# Patient Record
Sex: Male | Born: 1935 | Race: White | Hispanic: No | Smoking: Former smoker
Health system: Southern US, Community
[De-identification: ages and names within clinical notes are randomized; demographics above are authoritative.]

## PROBLEM LIST (undated history)

## (undated) DIAGNOSIS — I2510 Atherosclerotic heart disease of native coronary artery without angina pectoris: Secondary | ICD-10-CM

## (undated) DIAGNOSIS — I471 Supraventricular tachycardia, unspecified: Secondary | ICD-10-CM

## (undated) DIAGNOSIS — G629 Polyneuropathy, unspecified: Secondary | ICD-10-CM

## (undated) DIAGNOSIS — I509 Heart failure, unspecified: Secondary | ICD-10-CM

## (undated) DIAGNOSIS — I639 Cerebral infarction, unspecified: Secondary | ICD-10-CM

## (undated) DIAGNOSIS — Z951 Presence of aortocoronary bypass graft: Secondary | ICD-10-CM

## (undated) DIAGNOSIS — C61 Malignant neoplasm of prostate: Secondary | ICD-10-CM

## (undated) DIAGNOSIS — E7800 Pure hypercholesterolemia, unspecified: Secondary | ICD-10-CM

## (undated) HISTORY — PX: PROSTATECTOMY: SHX69

## (undated) HISTORY — PX: REPLACEMENT TOTAL KNEE BILATERAL: SUR1225

## (undated) HISTORY — PX: CORONARY ARTERY BYPASS GRAFT: SHX141

## (undated) HISTORY — PX: AORTIC VALVE REPLACEMENT: SHX41

## (undated) HISTORY — PX: FRACTURE SURGERY: SHX138

## (undated) HISTORY — PX: BACK SURGERY: SHX140

## (undated) HISTORY — DX: Cerebral infarction, unspecified: I63.9

---

## 2004-01-17 ENCOUNTER — Ambulatory Visit: Payer: Self-pay | Admitting: Urology

## 2004-01-23 ENCOUNTER — Ambulatory Visit: Payer: Self-pay | Admitting: Urology

## 2004-01-24 ENCOUNTER — Other Ambulatory Visit: Payer: Self-pay

## 2004-01-24 ENCOUNTER — Emergency Department: Payer: Self-pay | Admitting: Unknown Physician Specialty

## 2004-08-21 ENCOUNTER — Ambulatory Visit: Payer: Self-pay | Admitting: Internal Medicine

## 2004-09-13 ENCOUNTER — Other Ambulatory Visit: Payer: Self-pay

## 2004-09-23 ENCOUNTER — Inpatient Hospital Stay: Payer: Self-pay | Admitting: General Practice

## 2005-04-10 ENCOUNTER — Other Ambulatory Visit: Payer: Self-pay

## 2005-04-23 ENCOUNTER — Inpatient Hospital Stay: Payer: Self-pay | Admitting: General Practice

## 2006-07-16 ENCOUNTER — Ambulatory Visit: Payer: Self-pay | Admitting: Internal Medicine

## 2008-01-25 ENCOUNTER — Ambulatory Visit: Payer: Self-pay | Admitting: Urology

## 2008-10-17 ENCOUNTER — Inpatient Hospital Stay: Payer: Self-pay | Admitting: Internal Medicine

## 2008-11-08 ENCOUNTER — Encounter: Payer: Self-pay | Admitting: Internal Medicine

## 2008-11-29 ENCOUNTER — Encounter: Payer: Self-pay | Admitting: Internal Medicine

## 2008-12-14 ENCOUNTER — Encounter: Payer: Self-pay | Admitting: Internal Medicine

## 2008-12-29 ENCOUNTER — Encounter: Payer: Self-pay | Admitting: Internal Medicine

## 2009-01-29 ENCOUNTER — Encounter: Payer: Self-pay | Admitting: Internal Medicine

## 2012-11-08 ENCOUNTER — Ambulatory Visit: Payer: Self-pay | Admitting: Urology

## 2012-11-08 LAB — CBC WITH DIFFERENTIAL/PLATELET
Basophil #: 0 10*3/uL (ref 0.0–0.1)
Eosinophil #: 0.1 10*3/uL (ref 0.0–0.7)
Eosinophil %: 2.4 %
HGB: 11.8 g/dL — ABNORMAL LOW (ref 13.0–18.0)
Lymphocyte #: 0.7 10*3/uL — ABNORMAL LOW (ref 1.0–3.6)
MCH: 31.3 pg (ref 26.0–34.0)
MCHC: 34.5 g/dL (ref 32.0–36.0)
Monocyte #: 0.5 x10 3/mm (ref 0.2–1.0)
Neutrophil %: 74.2 %
Platelet: 193 10*3/uL (ref 150–440)
WBC: 5.4 10*3/uL (ref 3.8–10.6)

## 2012-11-08 LAB — BASIC METABOLIC PANEL
Anion Gap: 2 — ABNORMAL LOW (ref 7–16)
BUN: 16 mg/dL (ref 7–18)
Co2: 32 mmol/L (ref 21–32)
Glucose: 81 mg/dL (ref 65–99)

## 2012-11-16 ENCOUNTER — Ambulatory Visit: Payer: Self-pay | Admitting: Urology

## 2012-11-18 LAB — PATHOLOGY REPORT

## 2013-08-11 ENCOUNTER — Emergency Department: Payer: Self-pay | Admitting: Emergency Medicine

## 2013-08-11 LAB — BASIC METABOLIC PANEL
ANION GAP: 4 — AB (ref 7–16)
BUN: 20 mg/dL — AB (ref 7–18)
CALCIUM: 8.5 mg/dL (ref 8.5–10.1)
CO2: 30 mmol/L (ref 21–32)
Chloride: 105 mmol/L (ref 98–107)
Creatinine: 0.77 mg/dL (ref 0.60–1.30)
EGFR (Non-African Amer.): >60
GLUCOSE: 75 mg/dL (ref 65–99)
OSMOLALITY: 279 (ref 275–301)
Potassium: 4.9 mmol/L (ref 3.5–5.1)
SODIUM: 139 mmol/L (ref 136–145)

## 2013-08-11 LAB — CBC
HCT: 34 % — ABNORMAL LOW (ref 40.0–52.0)
HGB: 11.6 g/dL — ABNORMAL LOW (ref 13.0–18.0)
MCH: 31.6 pg (ref 26.0–34.0)
MCHC: 34.2 g/dL (ref 32.0–36.0)
MCV: 92 fL (ref 80–100)
Platelet: 161 10*3/uL (ref 150–440)
RBC: 3.68 10*6/uL — ABNORMAL LOW (ref 4.40–5.90)
RDW: 12.8 % (ref 11.5–14.5)
WBC: 5.2 10*3/uL (ref 3.8–10.6)

## 2013-08-11 LAB — TROPONIN I

## 2013-08-12 LAB — CK: CK, Total: 146 U/L

## 2014-07-21 NOTE — Op Note (Signed)
PATIENT NAME:  Juan Mayo, Juan Mayo MR#:  [id number] DATE OF BIRTH:  June 29, 1921  DATE OF PROCEDURE:  11/16/2012  PRINCIPLE DIAGNOSIS: Bladder neoplasm uncertain.   POSTOPERATIVE DIAGNOSIS: Bladder neoplasm uncertain.  PROCEDURE: Cystoscopy, bladder biopsy, bladder instillation.   SURGEON: Edrick Oh, MD  ANESTHESIA: Laryngeal mask airway anesthesia.   INDICATIONS: The patient is an 81 year old gentleman with a history of prostate cancer. He developed increased urinary frequency and urgency. Recent cystoscopy demonstrated a small papillary-appearing lesion on the left trigone. He presents for biopsy of the lesion.   DESCRIPTION OF PROCEDURE: After informed consent was obtained, the patient was taken to the operating room and placed in the dorsal lithotomy position under laryngeal mask airway anesthesia. The patient was then prepped and draped in the usual standard fashion. The 23 French rigid cystoscope was introduced into the urethra under direct vision with no urethral abnormalities noted. Upon entering the prostate, moderate bilobar tissue was present with only partial visual obstruction. Upon entering the bladder, the mucosa was inspected in its entirety with no gross mucosal lesions noted. Bilateral ureteral orifices were well visualized with no lesions noted. 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 Cold cup biopsy forceps were then utilized to remove the lesion. This was removed in 2 portions. The ureteral orifice was noted to retract somewhat lateral with the first biopsy.  A guidewire was inserted into the left ureteral orifice. There was an adequate rim of mucosal tissue at its more distal aspect. Cautery was utilized around the base of the biopsy site except for the edges of the ureteral orifice itself. No significant bleeding  was present in this region. The decision was made not to place a stent due to the rim of normal tissue at the orifice itself. No other lesions were noted anywhere else in the bladder. The bladder was drained. The cystoscope was removed. An 21 French red rubber catheter was inserted into the urinary bladder. 40 mL of 2% lidocaine was instilled into the bladder. The catheter was removed. The patient was returned to the supine position and awakened from laryngeal mask airway anesthesia. The patient tolerated the procedure well. There were no problems or complications.   ESTIMATED BLOOD LOSS: Minimal.    ____________________________ Denice Bors. Jacqlyn Larsen, MD bsc:dp D: 11/16/2012 11:59:32 ET T: 11/16/2012 13:40:48 ET JOB#: [id number]  cc: Denice Bors. Jacqlyn Larsen, MD, <Dictator> Denice Bors Angelgabriel Willmore MD ELECTRONICALLY SIGNED 11/16/2012 21:31

## 2015-03-18 ENCOUNTER — Inpatient Hospital Stay
Admission: EM | Admit: 2015-03-18 | Discharge: 2015-03-21 | DRG: 481 | Disposition: A | Discharge status: Skilled Nursing Facility | Payer: PPO | Attending: Internal Medicine | Admitting: Internal Medicine

## 2015-03-18 ENCOUNTER — Encounter
Admission: EM | Disposition: A | Discharge status: Skilled Nursing Facility | Payer: Self-pay | Source: Home / Self Care | Attending: Internal Medicine

## 2015-03-18 ENCOUNTER — Encounter: Payer: Self-pay | Admitting: Emergency Medicine

## 2015-03-18 ENCOUNTER — Inpatient Hospital Stay: Payer: PPO

## 2015-03-18 ENCOUNTER — Inpatient Hospital Stay: Payer: PPO | Admitting: Anesthesiology

## 2015-03-18 ENCOUNTER — Emergency Department: Payer: PPO

## 2015-03-18 DIAGNOSIS — I2510 Atherosclerotic heart disease of native coronary artery without angina pectoris: Secondary | ICD-10-CM | POA: Diagnosis present

## 2015-03-18 DIAGNOSIS — S72141A Displaced intertrochanteric fracture of right femur, initial encounter for closed fracture: Principal | ICD-10-CM | POA: Diagnosis present

## 2015-03-18 DIAGNOSIS — D62 Acute posthemorrhagic anemia: Secondary | ICD-10-CM | POA: Diagnosis not present

## 2015-03-18 DIAGNOSIS — Z7982 Long term (current) use of aspirin: Secondary | ICD-10-CM | POA: Diagnosis not present

## 2015-03-18 DIAGNOSIS — I10 Essential (primary) hypertension: Secondary | ICD-10-CM | POA: Diagnosis present

## 2015-03-18 DIAGNOSIS — Z951 Presence of aortocoronary bypass graft: Secondary | ICD-10-CM | POA: Diagnosis not present

## 2015-03-18 DIAGNOSIS — E7800 Pure hypercholesterolemia, unspecified: Secondary | ICD-10-CM | POA: Diagnosis present

## 2015-03-18 DIAGNOSIS — Z96653 Presence of artificial knee joint, bilateral: Secondary | ICD-10-CM | POA: Diagnosis present

## 2015-03-18 DIAGNOSIS — Z952 Presence of prosthetic heart valve: Secondary | ICD-10-CM

## 2015-03-18 DIAGNOSIS — G629 Polyneuropathy, unspecified: Secondary | ICD-10-CM | POA: Diagnosis present

## 2015-03-18 DIAGNOSIS — Z8546 Personal history of malignant neoplasm of prostate: Secondary | ICD-10-CM | POA: Diagnosis not present

## 2015-03-18 DIAGNOSIS — Z79899 Other long term (current) drug therapy: Secondary | ICD-10-CM | POA: Diagnosis not present

## 2015-03-18 DIAGNOSIS — Z87891 Personal history of nicotine dependence: Secondary | ICD-10-CM | POA: Diagnosis not present

## 2015-03-18 DIAGNOSIS — Z953 Presence of xenogenic heart valve: Secondary | ICD-10-CM | POA: Diagnosis not present

## 2015-03-18 DIAGNOSIS — W19XXXA Unspecified fall, initial encounter: Secondary | ICD-10-CM | POA: Diagnosis present

## 2015-03-18 DIAGNOSIS — S72009A Fracture of unspecified part of neck of unspecified femur, initial encounter for closed fracture: Secondary | ICD-10-CM | POA: Diagnosis present

## 2015-03-18 HISTORY — DX: Pure hypercholesterolemia, unspecified: E78.00

## 2015-03-18 HISTORY — DX: Malignant neoplasm of prostate: C61

## 2015-03-18 HISTORY — DX: Polyneuropathy, unspecified: G62.9

## 2015-03-18 HISTORY — PX: INTRAMEDULLARY (IM) NAIL INTERTROCHANTERIC: SHX5875

## 2015-03-18 LAB — CBC WITH DIFFERENTIAL/PLATELET
BASOS ABS: 0.1 10*3/uL (ref 0–0.1)
BASOS PCT: 1 %
EOS ABS: 0.2 10*3/uL (ref 0–0.7)
Eosinophils Relative: 2 %
HEMATOCRIT: 37.6 % — AB (ref 40.0–52.0)
Hemoglobin: 12.6 g/dL — ABNORMAL LOW (ref 13.0–18.0)
Lymphocytes Relative: 7 %
Lymphs Abs: 0.7 10*3/uL — ABNORMAL LOW (ref 1.0–3.6)
MCH: 31 pg (ref 26.0–34.0)
MCHC: 33.6 g/dL (ref 32.0–36.0)
MCV: 92.4 fL (ref 80.0–100.0)
MONO ABS: 0.6 10*3/uL (ref 0.2–1.0)
MONOS PCT: 5 %
NEUTROS ABS: 8.9 10*3/uL — AB (ref 1.4–6.5)
NEUTROS PCT: 85 %
PLATELETS: 200 10*3/uL (ref 150–440)
RBC: 4.06 MIL/uL — ABNORMAL LOW (ref 4.40–5.90)
RDW: 13 % (ref 11.5–14.5)
WBC: 10.4 10*3/uL (ref 3.8–10.6)

## 2015-03-18 LAB — COMPREHENSIVE METABOLIC PANEL
ALBUMIN: 4 g/dL (ref 3.5–5.0)
ALT: 15 U/L — ABNORMAL LOW (ref 17–63)
AST: 20 U/L (ref 15–41)
Alkaline Phosphatase: 91 U/L (ref 38–126)
Anion gap: 5 (ref 5–15)
BILIRUBIN TOTAL: 0.6 mg/dL (ref 0.3–1.2)
BUN: 19 mg/dL (ref 6–20)
CO2: 31 mmol/L (ref 22–32)
Calcium: 8.9 mg/dL (ref 8.9–10.3)
Chloride: 101 mmol/L (ref 101–111)
Creatinine, Ser: 0.91 mg/dL (ref 0.61–1.24)
GFR calc Af Amer: >60 mL/min (ref >60)
GFR calc non Af Amer: >60 mL/min (ref >60)
GLUCOSE: 123 mg/dL — AB (ref 65–99)
POTASSIUM: 4.4 mmol/L (ref 3.5–5.1)
Sodium: 137 mmol/L (ref 135–145)
TOTAL PROTEIN: 6.8 g/dL (ref 6.5–8.1)

## 2015-03-18 LAB — URINALYSIS COMPLETE WITH MICROSCOPIC (ARMC ONLY)
Bacteria, UA: NONE SEEN
Bilirubin Urine: NEGATIVE
Glucose, UA: NEGATIVE mg/dL
Leukocytes, UA: NEGATIVE
Nitrite: NEGATIVE
Protein, ur: NEGATIVE mg/dL
Specific Gravity, Urine: 1.018 (ref 1.005–1.030)
pH: 6 (ref 5.0–8.0)

## 2015-03-18 LAB — PROTIME-INR
INR: 1.09
PROTHROMBIN TIME: 14.3 s (ref 11.4–15.0)

## 2015-03-18 LAB — APTT: aPTT: 29 seconds (ref 24–36)

## 2015-03-18 LAB — SURGICAL PCR SCREEN
MRSA, PCR: NEGATIVE
Staphylococcus aureus: NEGATIVE

## 2015-03-18 LAB — TYPE AND SCREEN
ABO/RH(D): A POS
Antibody Screen: NEGATIVE

## 2015-03-18 LAB — TSH: TSH: 5.78 u[IU]/mL — ABNORMAL HIGH (ref 0.350–4.500)

## 2015-03-18 LAB — ABO/RH: ABO/RH(D): A POS

## 2015-03-18 SURGERY — FIXATION, FRACTURE, INTERTROCHANTERIC, WITH INTRAMEDULLARY ROD
Anesthesia: Spinal | Laterality: Right | Wound class: Clean

## 2015-03-18 MED ORDER — MORPHINE SULFATE (PF) 4 MG/ML IV SOLN
4.0000 mg | Freq: Once | INTRAVENOUS | Status: AC
  Administered 2015-03-18: 4 mg via INTRAVENOUS
  Filled 2015-03-18: qty 1

## 2015-03-18 MED ORDER — BISACODYL 10 MG RE SUPP
10.0000 mg | Freq: Every day | RECTAL | Status: DC | PRN
  Administered 2015-03-21: 10 mg via RECTAL
  Filled 2015-03-18: qty 1

## 2015-03-18 MED ORDER — MENTHOL 3 MG MT LOZG
1.0000 | LOZENGE | OROMUCOSAL | Status: DC | PRN
  Filled 2015-03-18: qty 9

## 2015-03-18 MED ORDER — CEFAZOLIN SODIUM 1-5 GM-% IV SOLN
1.0000 g | Freq: Once | INTRAVENOUS | Status: AC
  Administered 2015-03-18: 1 g via INTRAVENOUS
  Filled 2015-03-18: qty 50

## 2015-03-18 MED ORDER — DOCUSATE SODIUM 100 MG PO CAPS
100.0000 mg | ORAL_CAPSULE | Freq: Two times a day (BID) | ORAL | Status: DC
  Administered 2015-03-18 – 2015-03-21 (×4): 100 mg via ORAL
  Filled 2015-03-18 (×5): qty 1

## 2015-03-18 MED ORDER — BUPIVACAINE HCL (PF) 0.75 % IJ SOLN
INTRAMUSCULAR | Status: DC | PRN
Start: 2015-03-18 — End: 2015-03-18
  Administered 2015-03-18: 12 mg

## 2015-03-18 MED ORDER — ENOXAPARIN SODIUM 40 MG/0.4ML ~~LOC~~ SOLN
40.0000 mg | SUBCUTANEOUS | Status: DC
  Administered 2015-03-19 – 2015-03-21 (×3): 40 mg via SUBCUTANEOUS
  Filled 2015-03-18 (×3): qty 0.4

## 2015-03-18 MED ORDER — CEFAZOLIN (ANCEF) 1 G IV SOLR
1.0000 g | INTRAVENOUS | Status: DC
  Filled 2015-03-18: qty 1

## 2015-03-18 MED ORDER — ONDANSETRON HCL 4 MG PO TABS
4.0000 mg | ORAL_TABLET | Freq: Four times a day (QID) | ORAL | Status: DC | PRN
  Administered 2015-03-19: 4 mg via ORAL
  Filled 2015-03-18: qty 1

## 2015-03-18 MED ORDER — MAGNESIUM CITRATE PO SOLN
1.0000 | Freq: Once | ORAL | Status: DC | PRN
  Filled 2015-03-18: qty 296

## 2015-03-18 MED ORDER — ALUM & MAG HYDROXIDE-SIMETH 200-200-20 MG/5ML PO SUSP: 30.0000 mL | ORAL | Status: DC | PRN

## 2015-03-18 MED ORDER — HYDROCODONE-ACETAMINOPHEN 5-325 MG PO TABS
1.0000 | ORAL_TABLET | Freq: Four times a day (QID) | ORAL | Status: DC | PRN
  Administered 2015-03-18 – 2015-03-21 (×9): 1 via ORAL
  Filled 2015-03-18: qty 2
  Filled 2015-03-18 (×8): qty 1

## 2015-03-18 MED ORDER — PROPOFOL 10 MG/ML IV BOLUS
INTRAVENOUS | Status: DC | PRN
  Administered 2015-03-18: 10 mg via INTRAVENOUS
  Administered 2015-03-18 (×2): 20 mg via INTRAVENOUS
  Administered 2015-03-18: 15 mg via INTRAVENOUS

## 2015-03-18 MED ORDER — MORPHINE SULFATE (PF) 2 MG/ML IV SOLN
1.0000 mg | INTRAVENOUS | Status: DC | PRN
  Administered 2015-03-18: 1 mg via INTRAVENOUS
  Filled 2015-03-18: qty 1

## 2015-03-18 MED ORDER — LACTATED RINGERS IV SOLN
INTRAVENOUS | Status: DC | PRN
  Administered 2015-03-18: 16:00:00 via INTRAVENOUS

## 2015-03-18 MED ORDER — METOCLOPRAMIDE HCL 5 MG/ML IJ SOLN: 5.0000 mg | Freq: Three times a day (TID) | INTRAMUSCULAR | Status: DC | PRN

## 2015-03-18 MED ORDER — MAGNESIUM HYDROXIDE 400 MG/5ML PO SUSP
30.0000 mL | Freq: Every day | ORAL | Status: DC | PRN
  Administered 2015-03-19 – 2015-03-20 (×2): 30 mL via ORAL
  Filled 2015-03-18 (×2): qty 30

## 2015-03-18 MED ORDER — ONDANSETRON HCL 4 MG/2ML IJ SOLN
4.0000 mg | Freq: Once | INTRAMUSCULAR | Status: AC
  Administered 2015-03-18: 4 mg via INTRAVENOUS
  Filled 2015-03-18: qty 2

## 2015-03-18 MED ORDER — ONDANSETRON HCL 4 MG/2ML IJ SOLN: 4.0000 mg | Freq: Four times a day (QID) | INTRAMUSCULAR | Status: DC | PRN

## 2015-03-18 MED ORDER — PHENYLEPHRINE HCL 10 MG/ML IJ SOLN
INTRAMUSCULAR | Status: DC | PRN
  Administered 2015-03-18: 200 ug via INTRAVENOUS

## 2015-03-18 MED ORDER — SODIUM CHLORIDE 0.9 % IV SOLN
INTRAVENOUS | Status: DC
  Administered 2015-03-18: 18:00:00 via INTRAVENOUS

## 2015-03-18 MED ORDER — ACETAMINOPHEN 325 MG PO TABS: 650.0000 mg | ORAL_TABLET | Freq: Four times a day (QID) | ORAL | Status: DC | PRN

## 2015-03-18 MED ORDER — DEXTROSE-NACL 5-0.9 % IV SOLN: INTRAVENOUS | Status: DC

## 2015-03-18 MED ORDER — GABAPENTIN 300 MG PO CAPS
300.0000 mg | ORAL_CAPSULE | Freq: Every day | ORAL | Status: DC
  Administered 2015-03-19 – 2015-03-21 (×3): 300 mg via ORAL
  Filled 2015-03-18 (×3): qty 1

## 2015-03-18 MED ORDER — SODIUM CHLORIDE 0.9 % IV SOLN
Freq: Once | INTRAVENOUS | Status: AC
  Administered 2015-03-18: 04:00:00 via INTRAVENOUS

## 2015-03-18 MED ORDER — CEFAZOLIN SODIUM 1-5 GM-% IV SOLN
1.0000 g | Freq: Four times a day (QID) | INTRAVENOUS | Status: AC
  Administered 2015-03-18 – 2015-03-19 (×3): 1 g via INTRAVENOUS
  Filled 2015-03-18 (×4): qty 50

## 2015-03-18 MED ORDER — ACETAMINOPHEN 650 MG RE SUPP: 650.0000 mg | Freq: Four times a day (QID) | RECTAL | Status: DC | PRN

## 2015-03-18 MED ORDER — ONDANSETRON HCL 4 MG/2ML IJ SOLN: 4.0000 mg | Freq: Once | INTRAMUSCULAR | Status: DC | PRN

## 2015-03-18 MED ORDER — SODIUM CHLORIDE 0.9 % IJ SOLN
3.0000 mL | Freq: Two times a day (BID) | INTRAMUSCULAR | Status: DC
Start: 2015-03-18 — End: 2015-03-21
  Administered 2015-03-18 – 2015-03-21 (×6): 3 mL via INTRAVENOUS

## 2015-03-18 MED ORDER — METOPROLOL TARTRATE 25 MG PO TABS
25.0000 mg | ORAL_TABLET | Freq: Two times a day (BID) | ORAL | Status: DC
  Administered 2015-03-18 – 2015-03-21 (×6): 25 mg via ORAL
  Filled 2015-03-18 (×6): qty 1

## 2015-03-18 MED ORDER — PHENOL 1.4 % MT LIQD
1.0000 | OROMUCOSAL | Status: DC | PRN
  Filled 2015-03-18: qty 177

## 2015-03-18 MED ORDER — FENTANYL CITRATE (PF) 100 MCG/2ML IJ SOLN: 25.0000 ug | INTRAMUSCULAR | Status: DC | PRN

## 2015-03-18 MED ORDER — FENTANYL CITRATE (PF) 100 MCG/2ML IJ SOLN
INTRAMUSCULAR | Status: DC | PRN
  Administered 2015-03-18 (×2): 25 ug via INTRAVENOUS

## 2015-03-18 MED ORDER — METOCLOPRAMIDE HCL 5 MG PO TABS: 5.0000 mg | ORAL_TABLET | Freq: Three times a day (TID) | ORAL | Status: DC | PRN

## 2015-03-18 MED ORDER — NEOMYCIN-POLYMYXIN B GU 40-200000 IR SOLN
Status: DC | PRN
  Administered 2015-03-18: 500 mL

## 2015-03-18 SURGICAL SUPPLY — 37 items
BIT DRILL CANN LG 4.3MM (BIT) ×1 IMPLANT
CANISTER SUCT 1200ML W/VALVE (MISCELLANEOUS) ×3 IMPLANT
CHLORAPREP W/TINT 26ML (MISCELLANEOUS) ×3 IMPLANT
DRAPE SHEET LG 3/4 BI-LAMINATE (DRAPES) ×3 IMPLANT
DRAPE SURG 17X11 SM STRL (DRAPES) ×3 IMPLANT
DRAPE U-SHAPE 47X51 STRL (DRAPES) ×3 IMPLANT
DRILL BIT CANN LG 4.3MM (BIT) ×3
DRSG OPSITE POSTOP 3X4 (GAUZE/BANDAGES/DRESSINGS) ×9 IMPLANT
ELECT CAUTERY BLADE 6.4 (BLADE) ×3 IMPLANT
GAUZE SPONGE 4X4 12PLY STRL (GAUZE/BANDAGES/DRESSINGS) ×3 IMPLANT
GLOVE BIOGEL PI IND STRL 7.0 (GLOVE) ×1 IMPLANT
GLOVE BIOGEL PI IND STRL 9 (GLOVE) ×1 IMPLANT
GLOVE BIOGEL PI INDICATOR 7.0 (GLOVE) ×2
GLOVE BIOGEL PI INDICATOR 9 (GLOVE) ×2
GLOVE SURG ORTHO 9.0 STRL STRW (GLOVE) ×3 IMPLANT
GLOVE SURG SYN 6.5 ES PF (GLOVE) ×3 IMPLANT
GOWN SPECIALTY ULTRA XL (MISCELLANEOUS) ×3 IMPLANT
GOWN STRL REUS W/ TWL LRG LVL3 (GOWN DISPOSABLE) ×1 IMPLANT
GOWN STRL REUS W/TWL LRG LVL3 (GOWN DISPOSABLE) ×2
GUIDEPIN VERSANAIL DSP 3.2X444 ×3 IMPLANT
HIP FRAC NAIL LAG SCR 10.5X100 (Orthopedic Implant) ×2 IMPLANT
IV NS 500ML (IV SOLUTION) ×2
IV NS 500ML BAXH (IV SOLUTION) ×1 IMPLANT
KIT RM TURNOVER STRD PROC AR (KITS) ×3 IMPLANT
MAT BLUE FLOOR 46X72 FLO (MISCELLANEOUS) ×3 IMPLANT
NAIL HIP FRACT 130D 9X180 (Orthopedic Implant) ×3 IMPLANT
NEEDLE FILTER BLUNT 18X 1/2SAF (NEEDLE) ×4
NEEDLE FILTER BLUNT 18X1 1/2 (NEEDLE) ×2 IMPLANT
PACK HIP COMPR (MISCELLANEOUS) ×3 IMPLANT
PAD GROUND ADULT SPLIT (MISCELLANEOUS) ×3 IMPLANT
SCREW BONE CORTICAL 5.0X36 (Screw) ×3 IMPLANT
SCREW CANN THRD AFF 10.5X100 (Orthopedic Implant) ×1 IMPLANT
STAPLER SKIN PROX 35W (STAPLE) ×3 IMPLANT
SUT VIC AB 1 CT1 36 (SUTURE) ×3 IMPLANT
SUT VIC AB 2-0 CT1 (SUTURE) ×3 IMPLANT
SYRINGE 10CC LL (SYRINGE) ×6 IMPLANT
TAPE MICROFOAM 4IN (TAPE) ×3 IMPLANT

## 2015-03-18 NOTE — H&P (Signed)
Juan Mayo is an 81 y.o. male.   Chief Complaint: Hip pain HPI: The patient presents emergency department after suffering a fall. He awoke to go to the restroom and tripped over his slippers. He fell on his right side and was unable to get up. He denies hitting his head or any loss of consciousness. In the emergency department he was found to have a trochanteric fracture of his right leg which prompted the emergency department staff to call for admission.  Past Medical History  Diagnosis Date  . Prostate cancer ([place])   . Hypercholesteremia   . Neuropathy [facility])     Past Surgical History  Procedure Laterality Date  . Replacement total knee bilateral    . Coronary artery bypass graft    . Back surgery    . Aortic valve replacement    . Prostatectomy      Family History  Problem Relation Age of Onset  . Hypertension Other    Social History:  reports that he has quit smoking. He does not have any smokeless tobacco history on file. He reports that he does not drink alcohol or use illicit drugs.  Allergies: No Known Allergies   (Not in a hospital admission)  Results for orders placed or performed during the hospital encounter of 03/18/15 (from the past 48 hour(s))  CBC with Differential/Platelet     Status: Abnormal   Collection Time: 03/18/15  4:09 AM  Result Value Ref Range   WBC 10.4 3.8 - 10.6 K/uL   RBC 4.06 (L) 4.40 - 5.90 MIL/uL   Hemoglobin 12.6 (L) 13.0 - 18.0 g/dL   HCT 37.6 (L) 40.0 - 52.0 %   MCV 92.4 80.0 - 100.0 fL   MCH 31.0 26.0 - 34.0 pg   MCHC 33.6 32.0 - 36.0 g/dL   RDW 13.0 11.5 - 14.5 %   Platelets 200 150 - 440 K/uL   Neutrophils Relative % 85 %   Neutro Abs 8.9 (H) 1.4 - 6.5 K/uL   Lymphocytes Relative 7 %   Lymphs Abs 0.7 (L) 1.0 - 3.6 K/uL   Monocytes Relative 5 %   Monocytes Absolute 0.6 0.2 - 1.0 K/uL   Eosinophils Relative 2 %   Eosinophils Absolute 0.2 0 - 0.7 K/uL   Basophils Relative 1 %   Basophils Absolute 0.1 0 - 0.1 K/uL  Type and  screen [facility]     Status: None   Collection Time: 03/18/15  4:09 AM  Result Value Ref Range   ABO/RH(D) A POS    Antibody Screen NEG    Sample Expiration 03/21/2015   APTT     Status: None   Collection Time: 03/18/15  4:09 AM  Result Value Ref Range   aPTT 29 24 - 36 seconds  Protime-INR     Status: None   Collection Time: 03/18/15  4:09 AM  Result Value Ref Range   Prothrombin Time 14.3 11.4 - 15.0 seconds   INR 1.09   Comprehensive metabolic panel     Status: Abnormal   Collection Time: 03/18/15  4:09 AM  Result Value Ref Range   Sodium 137 135 - 145 mmol/L   Potassium 4.4 3.5 - 5.1 mmol/L   Chloride 101 101 - 111 mmol/L   CO2 31 22 - 32 mmol/L   Glucose, Bld 123 (H) 65 - 99 mg/dL   BUN 19 6 - 20 mg/dL   Creatinine, Ser 0.91 0.61 - 1.24 mg/dL   Calcium  8.9 8.9 - 10.3 mg/dL   Total Protein 6.8 6.5 - 8.1 g/dL   Albumin 4.0 3.5 - 5.0 g/dL   AST 20 15 - 41 U/L   ALT 15 (L) 17 - 63 U/L   Alkaline Phosphatase 91 38 - 126 U/L   Total Bilirubin 0.6 0.3 - 1.2 mg/dL   GFR calc non Af Amer >60 >60 mL/min   GFR calc Af Amer >60 >60 mL/min    Comment: (NOTE) The eGFR has been calculated using the CKD EPI equation. This calculation has not been validated in all clinical situations. eGFR's persistently <60 mL/min signify possible Chronic Kidney Disease.    Anion gap 5 5 - 15  ABO/Rh     Status: None   Collection Time: 03/18/15  4:10 AM  Result Value Ref Range   ABO/RH(D) A POS    Dg Chest 1 View  03/18/2015  CLINICAL DATA:  Slip and fall injury. EXAM: CHEST 1 VIEW COMPARISON:  None. FINDINGS: Postoperative changes in the mediastinum. Emphysematous changes and scattered fibrosis in the lungs. Normal heart size and pulmonary vascularity. No focal airspace disease or consolidation. No blunting of costophrenic angles. No pneumothorax. Degenerative changes in the spine. Visualize ribs are grossly intact. IMPRESSION: Emphysematous changes and fibrosis in the  lungs. No evidence of active pulmonary disease. Electronically Signed   By: Lucienne Capers M.D.   On: 03/18/2015 05:36   Dg Hip Unilat With Pelvis 2-3 Views Right  03/18/2015  CLINICAL DATA:  Patient slipped and fell. Pain to the right lateral hip. EXAM: DG HIP (WITH OR WITHOUT PELVIS) 2-3V RIGHT COMPARISON:  None. FINDINGS: There is a comminuted inter trochanteric fracture of the proximal right femur with varus angulation of the fracture fragments. No dislocation of the hip. Diffuse bone demineralization. Pelvis appears intact. Degenerative changes in the lower lumbar spine and both hips. Vascular calcifications. IMPRESSION: Acute comminuted inter trochanteric fracture of the proximal right femur with varus angulation. Electronically Signed   By: Lucienne Capers M.D.   On: 03/18/2015 05:35    Review of Systems  Constitutional: Negative for fever and chills.  HENT: Negative for sore throat and tinnitus.   Eyes: Negative for blurred vision and redness.  Respiratory: Negative for cough and shortness of breath.   Cardiovascular: Negative for chest pain, palpitations, orthopnea and PND.  Gastrointestinal: Negative for nausea, vomiting, abdominal pain and diarrhea.  Genitourinary: Negative for dysuria, urgency and frequency.  Musculoskeletal: Positive for joint pain. Negative for myalgias.  Skin: Negative for rash.       No lesions  Neurological: Negative for speech change, focal weakness and weakness.  Endo/Heme/Allergies: Does not bruise/bleed easily.       No temperature intolerance  Psychiatric/Behavioral: Negative for depression and suicidal ideas.    Blood pressure 105/47, pulse 62, temperature 97.6 F (36.4 C), temperature source Oral, resp. rate 23, height 5' 7.5" (1.715 m), weight 56.7 kg (125 lb), SpO2 98 %. Physical Exam  Nursing note and vitals reviewed. Constitutional: He is oriented to person, place, and time. He appears well-developed and well-nourished. No distress.  HENT:   Head: Normocephalic and atraumatic.  Mouth/Throat: Oropharynx is clear and moist.  Eyes: EOM are normal. Pupils are equal, round, and reactive to light. No scleral icterus.  Neck: Normal range of motion. Neck supple. No JVD present. No tracheal deviation present. No thyromegaly present.  Cardiovascular: Normal rate, regular rhythm and normal heart sounds.  Exam reveals no gallop and no friction rub.   No  murmur heard. Respiratory: Effort normal and breath sounds normal.  GI: Soft. Bowel sounds are normal. He exhibits no distension. There is no tenderness.  Genitourinary:  Deferred  Musculoskeletal: He exhibits no edema.  Right leg is shortened and externally rotated  Lymphadenopathy:    He has no cervical adenopathy.  Neurological: He is alert and oriented to person, place, and time. No cranial nerve deficit.  Skin: Skin is warm and dry. No rash noted. No erythema.  Psychiatric: He has a normal mood and affect. His behavior is normal. Judgment and thought content normal.     Assessment/Plan This is a 81 year old male admitted for trochanteric fracture of the right femur. 1. Hip fracture: Goal is to manage pain until surgery. The patient is moderate to high risk given history of coronary artery disease status post bypass as well as aortic valve replacement in addition to advanced age. However the patient is in overall excellent health. May continue beta blocker prior to surgery. I have held aspirin until cleared to restart by orthopedics. 2. Essential hypertension: Controlled; continue metoprolol 3. DVT prophylax his: SCDs 4. GI prophylaxis: None as the patient not critically ill The patient is a full code. Time spent on admission orders and patient care approximately 45 minutes  Juan Mayo 03/18/2015, 7:12 AM

## 2015-03-18 NOTE — ED Notes (Signed)
Pt cleaned of one large soft stool; cleaned well; pt tolerated procedure well; pt also voided in urinal

## 2015-03-18 NOTE — ED Provider Notes (Signed)
[facility] Emergency Department Provider Note  ____________________________________________  Time seen: Approximately 4:49 AM  I have reviewed the triage vital signs and the nursing notes.   HISTORY  Chief Complaint Fall and Hip Pain    HPI Juan Mayo is a 81 y.o. male with a past medical history that includes a CABG and prostate cancer who presents with pain and deformity of his right hip after mechanical fall.  He states that he got up to go the bathroom and tripped over his own slippers.  He had acute onset of severe right hip pain and said that his leg was turned out to the side and he could not move it.He denies hitting his head or losing consciousness and has no headache or neck pain.  He denies fever/chills, chest pain, shortness of breath, nausea/vomiting, abdominal pain.  He has no pain in any of his extremities other than his right hip although he does have a skin tear on his right elbow.  He rates his pain as severe and movement makes it worse and lying still makes it better.   Past Medical History  Diagnosis Date  . Prostate cancer ([place])   . Hypercholesteremia   . Neuropathy ([place])     There are no active problems to display for this patient.   Past Surgical History  Procedure Laterality Date  . Replacement total knee bilateral    . Cardiac surgery    . Back surgery      Current Outpatient Rx  Name  Route  Sig  Dispense  Refill  . aspirin 81 MG tablet   Oral   Take 81 mg by mouth daily.         Marland Kitchen gabapentin (NEURONTIN) 300 MG capsule   Oral   Take 300 mg by mouth daily.         . metoprolol tartrate (LOPRESSOR) 25 MG tablet   Oral   Take 25 mg by mouth 2 (two) times daily.           Allergies Review of patient's allergies indicates no known allergies.  History reviewed. No pertinent family history.  Social History Social History  Substance Use Topics  . Smoking status: Former Research scientist (life sciences)  . Smokeless tobacco: None  .  Alcohol Use: No    Review of Systems Constitutional: No fever/chills Eyes: No visual changes. ENT: No sore throat. Cardiovascular: Denies chest pain. Respiratory: Denies shortness of breath. Gastrointestinal: No abdominal pain.  No nausea, no vomiting.  No diarrhea.  No constipation. Genitourinary: Negative for dysuria. Musculoskeletal: Severe pain in the right hip with external rotation and shortening Skin: Negative for rash. Neurological: Negative for headaches, focal weakness or numbness.  10-point ROS otherwise negative.  ____________________________________________   PHYSICAL EXAM:  VITAL SIGNS: ED Triage Vitals  Enc Vitals Group     BP 03/18/15 0351 132/67 mmHg     Pulse Rate 03/18/15 0351 60     Resp 03/18/15 0351 18     Temp 03/18/15 0351 97.6 F (36.4 C)     Temp Source 03/18/15 0351 Oral     SpO2 03/18/15 0351 100 %     Weight 03/18/15 0351 125 lb (56.7 kg)     Height 03/18/15 0351 5' 7.5" (1.715 m)     Head Cir --      Peak Flow --      Pain Score 03/18/15 0352 8     Pain Loc --      Pain Edu? --  Excl. in [place]? --     Constitutional: Alert and oriented. Well appearing and in no acute distress.  Appears younger than chronological age. Eyes: Conjunctivae are normal. PERRL. EOMI. Head: Atraumatic. Nose: No congestion/rhinnorhea. Mouth/Throat: Mucous membranes are moist.  Oropharynx non-erythematous. Neck: No stridor.  No cervical spine tenderness to palpation. Cardiovascular: Normal rate, regular rhythm. Grossly normal heart sounds.  Good peripheral circulation. Respiratory: Normal respiratory effort.  No retractions. Lungs CTAB. Gastrointestinal: Soft and nontender. No distention. No abdominal bruits. No CVA tenderness. Musculoskeletal: There is palpation of the right hip but the pelvis is stable.  Shortening and external rotation of RLE.  N/V intact distally.  Pain with passive range of motion of the right hip.  No distal injury appreciated of the right  lower extremity.  Neurologic:  Normal speech and language. No gross focal neurologic deficits are appreciated.  Skin:  Skin is warm, dry and intact. No rash noted.  skin tear on the right elbow Psychiatric: Mood and affect are normal. Speech and behavior are normal.  ____________________________________________   LABS (all labs ordered are listed, but only abnormal results are displayed)  Labs Reviewed  CBC WITH DIFFERENTIAL/PLATELET - Abnormal; Notable for the following:    RBC 4.06 (*)    Hemoglobin 12.6 (*)    HCT 37.6 (*)    Neutro Abs 8.9 (*)    Lymphs Abs 0.7 (*)    All other components within normal limits  COMPREHENSIVE METABOLIC PANEL - Abnormal; Notable for the following:    Glucose, Bld 123 (*)    ALT 15 (*)    All other components within normal limits  APTT  PROTIME-INR  URINALYSIS COMPLETEWITH MICROSCOPIC ([facility] ONLY)  TYPE AND SCREEN  ABO/RH   ____________________________________________  EKG  ED ECG REPORT I, Ken Bonn, the attending physician, personally viewed and interpreted this ECG.   Date: 03/18/2015  EKG Time: 04:00  Rate: 61  Rhythm: normal sinus rhythm  Axis: Normal  Intervals:Normal  ST&T Change: No evidence of acute ischemia.  Left ventricular hypertrophy.  ____________________________________________  RADIOLOGY   Dg Chest 1 View  03/18/2015  CLINICAL DATA:  Slip and fall injury. EXAM: CHEST 1 VIEW COMPARISON:  None. FINDINGS: Postoperative changes in the mediastinum. Emphysematous changes and scattered fibrosis in the lungs. Normal heart size and pulmonary vascularity. No focal airspace disease or consolidation. No blunting of costophrenic angles. No pneumothorax. Degenerative changes in the spine. Visualize ribs are grossly intact. IMPRESSION: Emphysematous changes and fibrosis in the lungs. No evidence of active pulmonary disease. Electronically Signed   By: Lucienne Capers M.D.   On: 03/18/2015 05:36   Dg Hip Unilat With Pelvis  2-3 Views Right  03/18/2015  CLINICAL DATA:  Patient slipped and fell. Pain to the right lateral hip. EXAM: DG HIP (WITH OR WITHOUT PELVIS) 2-3V RIGHT COMPARISON:  None. FINDINGS: There is a comminuted inter trochanteric fracture of the proximal right femur with varus angulation of the fracture fragments. No dislocation of the hip. Diffuse bone demineralization. Pelvis appears intact. Degenerative changes in the lower lumbar spine and both hips. Vascular calcifications. IMPRESSION: Acute comminuted inter trochanteric fracture of the proximal right femur with varus angulation. Electronically Signed   By: Lucienne Capers M.D.   On: 03/18/2015 05:35    ____________________________________________   PROCEDURES  Procedure(s) performed: None  Critical Care performed: No ____________________________________________   INITIAL IMPRESSION / ASSESSMENT AND PLAN / ED COURSE  Pertinent labs & imaging results that were available during my care of the  patient were reviewed by me and considered in my medical decision making (see chart for details).  Not concerned about head injury or neck injury.  Spoke by phone with Dr. Rudene Christians who will likely operate later today.  Discussed w/ family, patient, and hospitalist.  ____________________________________________  FINAL CLINICAL IMPRESSION(S) / ED DIAGNOSES  Final diagnoses:  Fracture, intertrochanteric, right femur, closed, initial encounter [facility])      NEW MEDICATIONS STARTED DURING THIS VISIT:  New Prescriptions   No medications on file     Hinda Kehr, MD 03/18/15 [phone]

## 2015-03-18 NOTE — Anesthesia Preprocedure Evaluation (Addendum)
Anesthesia Evaluation  Patient identified by MRN, date of birth, ID band Patient awake    Reviewed: Allergy & Precautions, NPO status , Patient's Chart, lab work & pertinent test results, reviewed documented beta blocker date and time   Airway Mallampati: II  TM Distance: >3 FB     Dental   Pulmonary former smoker,    Pulmonary exam normal breath sounds clear to auscultation       Cardiovascular hypertension, Pt. on medications and Pt. on home beta blockers + CAD  Normal cardiovascular exam     Neuro/Psych neuropathy negative neurological ROS  negative psych ROS   GI/Hepatic negative GI ROS, Neg liver ROS,   Endo/Other  negative endocrine ROS  Renal/GU negative Renal ROS  negative genitourinary   Musculoskeletal   Abdominal Normal abdominal exam  (+)   Peds negative pediatric ROS (+)  Hematology negative hematology ROS (+)   Anesthesia Other Findings   Reproductive/Obstetrics                            Anesthesia Physical Anesthesia Plan  ASA: III and emergent  Anesthesia Plan: Spinal   Post-op Pain Management:    Induction: Intravenous  Airway Management Planned:   Additional Equipment:   Intra-op Plan:   Post-operative Plan: Extubation in OR  Informed Consent: I have reviewed the patients History and Physical, chart, labs and discussed the procedure including the risks, benefits and alternatives for the proposed anesthesia with the patient or authorized representative who has indicated his/her understanding and acceptance.   Dental advisory given  Plan Discussed with: CRNA and Surgeon  Anesthesia Plan Comments:        Anesthesia Quick Evaluation

## 2015-03-18 NOTE — Op Note (Signed)
03/18/2015  4:38 PM  PATIENT:  Juan Mayo  81 y.o. male  PRE-OPERATIVE DIAGNOSIS:  right hip fracture intertrochanteric minimally displaced  POST-OPERATIVE DIAGNOSIS:  right hip fracture same  PROCEDURE:  Procedure(s): INTRAMEDULLARY (IM) NAIL INTERTROCHANTRIC (Right)  SURGEON: Laurene Footman, MD  ASSISTANTS: None  ANESTHESIA:   spinal  EBL:     BLOOD ADMINISTERED:none  DRAINS: none   LOCAL MEDICATIONS USED:  NONE  SPECIMEN:  No Specimen  DISPOSITION OF SPECIMEN:  N/A  COUNTS:  YES  TOURNIQUET:  * No tourniquets in log *  IMPLANTS:Biomet 9 x 1 80 rod 130 with 100 mm lag screw and 36 mm cortical screw  TION: .Dragon Dictation patient brought the operating room and after adequate spinal anesthesia was obtained, patient transferred to fracture table. Left leg placed well legholder right ligament traction boot. C-arm was brought in and good visualization of her reduced fracture was reduced with traction was obtained. Prepping draping the Barrier drape method was carried out followed by the patient identification and timeout procedures. Small incision made proximally and a guide were inserted to the tip of the trochanter proximal reaming carried out the rod was inserted down the canal with a small lateral incision and a guide were inserted in a center center position measured drilled and the 100 mm lag screw inserted followed by compression. The disc the proximal locking mechanism was applied with a quarter turn off to allow for compression of the lag screw. The distal interlocking screw was placed with a small skin incision drilling and then placing the 36 mm screw. The wounds were irrigated and closed with 2-0 Vicryl subcutaneous and skin staples honeycomb dressing applied  PLAN OF CARE: Continue as an inpatient  PATIENT DISPOSITION:  PACU - hemodynamically stable.

## 2015-03-18 NOTE — Anesthesia Procedure Notes (Signed)
Spinal  Start time: 03/18/2015 3:53 PM End time: 03/18/2015 3:09 PM Staffing Anesthesiologist: Alvin Critchley Resident/CRNA: Kennon Holter Performed by: anesthesiologist  Preanesthetic Checklist Completed: patient identified, site marked, surgical consent, pre-op evaluation, timeout performed, IV checked, risks and benefits discussed and monitors and equipment checked Spinal Block Patient position: sitting Prep: Betadine Patient monitoring: heart rate, continuous pulse ox and blood pressure Approach: midline Location: L3-4 Injection technique: single-shot Needle Needle type: Whitacre  Needle gauge: 24 G Needle length: 10 cm Needle insertion depth: 7.5 cm Assessment Sensory level: T6 Additional Notes VSS, NAD.

## 2015-03-18 NOTE — Consult Note (Addendum)
Right intertrochanteric hip fracture, plan ORIF later today. 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 He suffered a fall yesterday and is admitted for ORIF of the hip.  On examination his right leg is shortened and externally rotated and he is in severe pain. Sensation to the leg is intact is intact  X-rays show displaced intertrochanteric fracture  Clinical impression is right intertrochanteric hip fracture plan is ORIF with short IM rod. Risks benefits possible competitions discussed. Consult placed with Dr. Nehemiah Massed for preoperative L with his history of aortic stenosis and valve replacement

## 2015-03-18 NOTE — Consult Note (Signed)
[facility] Consultation Note  Patient ID: [place] NELLI, MRN: [id number], DOB/AGE: [birth year] 81 y.o. Admit date: 03/18/2015   Date of Consult: 03/18/2015 Primary Physician: No primary care provider on file. Primary Cardiologist: Nehemiah Massed  Chief Complaint:  Chief Complaint  Patient presents with  . Fall  . Hip Pain   Reason for Consult: aortic valve stenosis status post coronary bypass graft  HPI: 81 y.o. male with known aortic valve stenosis status post coronary artery bypass graft and aortic valve replacement in the remote past on appropriate medication management physically doing well with recent evaluation of his aortic valve replacement by echocardiogram earlier this year showing normal function. The patient has not had any episodes of chest pain shortness of breath or congestive heart failure in the last many months and is also had some occasional benign preventricular contractions with palpitations. The patient has been on appropriate metoprolol use for hypertension heart rate control and further risk reduction of left ventricular hypertrophy. The patient has had an episode where he tripped and fell and has now had a fracture requiring further surgical intervention. The patient is at lowest risk possible for cardiovascular complication with surgical intervention due to no evidence of new cardiac symptoms  Past Medical History  Diagnosis Date  . Prostate cancer ([place])   . Hypercholesteremia   . Neuropathy [facility])       Surgical History:  Past Surgical History  Procedure Laterality Date  . Replacement total knee bilateral    . Coronary artery bypass graft    . Back surgery    . Aortic valve replacement    . Prostatectomy    . Fracture surgery Right      Home Meds: Prior to Admission medications   Medication Sig Start Date End Date Taking? Authorizing Provider  aspirin 81 MG tablet Take 81 mg by mouth daily.   Yes Historical Provider, MD  gabapentin (NEURONTIN)  300 MG capsule Take 300 mg by mouth daily.   Yes Historical Provider, MD  metoprolol tartrate (LOPRESSOR) 25 MG tablet Take 25 mg by mouth 2 (two) times daily.   Yes Historical Provider, MD    Inpatient Medications:  . docusate sodium  100 mg Oral BID  . gabapentin  300 mg Oral Daily  . metoprolol tartrate  25 mg Oral BID  . sodium chloride  3 mL Intravenous Q12H   . sodium chloride    . dextrose 5 % and 0.9% NaCl      Allergies: No Known Allergies  Social History   Social History  . Marital Status: Widowed    Spouse Name: N/A  . Number of Children: N/A  . Years of Education: N/A   Occupational History  . Not on file.   Social History Main Topics  . Smoking status: Former Research scientist (life sciences)  . Smokeless tobacco: Not on file  . Alcohol Use: No  . Drug Use: No  . Sexual Activity: Not on file   Other Topics Concern  . Not on file   Social History Narrative  . No narrative on file     Family History  Problem Relation Age of Onset  . Hypertension Other      Review of Systems Positive for leg pain Negative for: General:  chills, fever, night sweats or weight changes.  Cardiovascular: PND orthopnea syncope dizziness  Dermatological skin lesions rashes Respiratory: Cough congestion Urologic: Frequent urination urination at night and hematuria Abdominal: negative for nausea, vomiting, diarrhea, bright red blood per rectum, melena,  or hematemesis Neurologic: negative for visual changes, and/or hearing changes  All other systems reviewed and are otherwise negative except as noted above.  Labs: No results for input(s): CKTOTAL, CKMB, TROPONINI in the last 72 hours. Lab Results  Component Value Date   WBC 10.4 03/18/2015   HGB 12.6* 03/18/2015   HCT 37.6* 03/18/2015   MCV 92.4 03/18/2015   PLT 200 03/18/2015    Recent Labs Lab 03/18/15 0409  NA 137  K 4.4  CL 101  CO2 31  BUN 19  CREATININE 0.91  CALCIUM 8.9  PROT 6.8  BILITOT 0.6  ALKPHOS 91  ALT 15*  AST 20   GLUCOSE 123*   No results found for: CHOL, HDL, LDLCALC, TRIG No results found for: DDIMER  Radiology/Studies:  Dg Chest 1 View  03/18/2015  CLINICAL DATA:  Slip and fall injury. EXAM: CHEST 1 VIEW COMPARISON:  None. FINDINGS: Postoperative changes in the mediastinum. Emphysematous changes and scattered fibrosis in the lungs. Normal heart size and pulmonary vascularity. No focal airspace disease or consolidation. No blunting of costophrenic angles. No pneumothorax. Degenerative changes in the spine. Visualize ribs are grossly intact. IMPRESSION: Emphysematous changes and fibrosis in the lungs. No evidence of active pulmonary disease. Electronically Signed   By: Lucienne Capers M.D.   On: 03/18/2015 05:36   Dg Hip Unilat With Pelvis 2-3 Views Right  03/18/2015  CLINICAL DATA:  Patient slipped and fell. Pain to the right lateral hip. EXAM: DG HIP (WITH OR WITHOUT PELVIS) 2-3V RIGHT COMPARISON:  None. FINDINGS: There is a comminuted inter trochanteric fracture of the proximal right femur with varus angulation of the fracture fragments. No dislocation of the hip. Diffuse bone demineralization. Pelvis appears intact. Degenerative changes in the lower lumbar spine and both hips. Vascular calcifications. IMPRESSION: Acute comminuted inter trochanteric fracture of the proximal right femur with varus angulation. Electronically Signed   By: Lucienne Capers M.D.   On: 03/18/2015 05:35    EKG: Normal sinus rhythm  Weights: Filed Weights   03/18/15 0351  Weight: 125 lb (56.7 kg)     Physical Exam: Blood pressure 111/45, pulse 68, temperature 97.6 F (36.4 C), temperature source Oral, resp. rate 18, height 5' 7.5" (1.715 m), weight 125 lb (56.7 kg), SpO2 100 %. Body mass index is 19.28 kg/(m^2). General: Well developed, well nourished, in no acute distress. Head eyes ears nose throat: Normocephalic, atraumatic, sclera non-icteric, no xanthomas, nares are without discharge. No apparent thyromegaly  and/or mass  Lungs: Normal respiratory effort.  no wheezes, no rales, no rhonchi.  Heart: RRR with normal S1 S2. 2+ aortic murmur gallop, no rub, PMI is normal size and placement, carotid upstroke normal with bruit, jugular venous pressure is normal Abdomen: Soft, non-tender, non-distended with normoactive bowel sounds. No hepatomegaly. No rebound/guarding. No obvious abdominal masses. Abdominal aorta is normal size without bruit Extremities: No edema. no cyanosis, no clubbing, no ulcers  Peripheral : 2+ bilateral upper extremity pulses, 2+ bilateral femoral pulses, 2+ bilateral dorsal pedal pulse Neuro: Alert and oriented. No facial asymmetry. No focal deficit. Moves all extremities spontaneously. Musculoskeletal: Normal muscle tone with kyphosis Psych:  Responds to questions appropriately with a normal affect.    Assessment: 81 year old male with aortic valve stenosis status post replacement coronary artery bypass graft status post coronary bypass injury in the remote past clinically stable with no evidence of significant new heart failure or anginal symptoms at lowest risk possible for cardiovascular complication with surgery  Plan: 1. Proceed to  surgical intervention of fracture without restriction 2. Continue metoprolol for heart rate control of preventricular contractions rare evidence of supraventricular tachycardia left ventricular hypertrophy and hypertension control 3. Further cardiac diagnostics necessary at this time 4. No restrictions to rehabilitation  Signed, Corey Skains M.D. [facility] 03/18/2015, 10:40 AM

## 2015-03-18 NOTE — Progress Notes (Signed)
[place] [facility] [place] NAME: Juan Mayo    MR#:  [id number]  DATE OF BIRTH:  1922/03/20  SUBJECTIVE:  CHIEF COMPLAINT:   Chief Complaint  Patient presents with  . Fall  . Hip Pain   - admitted for a fall and right hip pain. With intertrochanteric fracture and for OR today - denies any complaints  REVIEW OF SYSTEMS:  Review of Systems  Constitutional: Negative for fever and chills.  HENT: Negative for ear discharge, ear pain and nosebleeds.   Eyes: Negative for blurred vision.  Respiratory: Negative for cough, shortness of breath and wheezing.   Cardiovascular: Negative for chest pain and palpitations.  Gastrointestinal: Negative for nausea, vomiting, abdominal pain, diarrhea and constipation.  Genitourinary: Negative for dysuria.  Musculoskeletal: Positive for joint pain. Negative for myalgias.       Right hip pain  Neurological: Negative for dizziness, sensory change, speech change, focal weakness, seizures, weakness and headaches.  Endo/Heme/Allergies: Does not bruise/bleed easily.  Psychiatric/Behavioral: Negative for depression.    DRUG ALLERGIES:  No Known Allergies  VITALS:  Blood pressure 122/47, pulse 63, temperature 97.5 F (36.4 C), temperature source Oral, resp. rate 18, height 5' 7.5" (1.715 m), weight 56.7 kg (125 lb), SpO2 100 %.  PHYSICAL EXAMINATION:  Physical Exam  GENERAL:  81 y.o.-year-old patient lying in the bed with no acute distress.  EYES: Pupils equal, round, reactive to light and accommodation. No scleral icterus. Extraocular muscles intact.  HEENT: Head atraumatic, normocephalic. Oropharynx and nasopharynx clear.  NECK:  Supple, no jugular venous distention. No thyroid enlargement, no tenderness.  LUNGS: Normal breath sounds bilaterally, no wheezing, rales,rhonchi or crepitation. No use of accessory muscles of respiration. Decreased bibasilar breath sounds. CARDIOVASCULAR: S1, S2 normal. No  rubs, or gallops. Loud 3/6 systolic murmur in aortic area. ABDOMEN: Soft, nontender, nondistended. Bowel sounds present. No organomegaly or mass.  EXTREMITIES: No pedal edema, cyanosis, or clubbing.  NEUROLOGIC: Cranial nerves II through XII are intact. Muscle strength 5/5 in all extremities.Limited right leg movement due to pain from fracture. Sensation intact. Gait not checked.  PSYCHIATRIC: The patient is alert and oriented x 3.  SKIN: No obvious rash, lesion, or ulcer.    LABORATORY PANEL:   CBC  Recent Labs Lab 03/18/15 0409  WBC 10.4  HGB 12.6*  HCT 37.6*  PLT 200   ------------------------------------------------------------------------------------------------------------------  Chemistries   Recent Labs Lab 03/18/15 0409  NA 137  K 4.4  CL 101  CO2 31  GLUCOSE 123*  BUN 19  CREATININE 0.91  CALCIUM 8.9  AST 20  ALT 15*  ALKPHOS 91  BILITOT 0.6   ------------------------------------------------------------------------------------------------------------------  Cardiac Enzymes No results for input(s): TROPONINI in the last 168 hours. ------------------------------------------------------------------------------------------------------------------  RADIOLOGY:  Dg Chest 1 View  03/18/2015  CLINICAL DATA:  Slip and fall injury. EXAM: CHEST 1 VIEW COMPARISON:  None. FINDINGS: Postoperative changes in the mediastinum. Emphysematous changes and scattered fibrosis in the lungs. Normal heart size and pulmonary vascularity. No focal airspace disease or consolidation. No blunting of costophrenic angles. No pneumothorax. Degenerative changes in the spine. Visualize ribs are grossly intact. IMPRESSION: Emphysematous changes and fibrosis in the lungs. No evidence of active pulmonary disease. Electronically Signed   By: Lucienne Capers M.D.   On: 03/18/2015 05:36   Dg Hip Unilat With Pelvis 2-3 Views Right  03/18/2015  CLINICAL DATA:  Patient slipped and fell. Pain to  the right lateral hip. EXAM: DG HIP (WITH OR  WITHOUT PELVIS) 2-3V RIGHT COMPARISON:  None. FINDINGS: There is a comminuted inter trochanteric fracture of the proximal right femur with varus angulation of the fracture fragments. No dislocation of the hip. Diffuse bone demineralization. Pelvis appears intact. Degenerative changes in the lower lumbar spine and both hips. Vascular calcifications. IMPRESSION: Acute comminuted inter trochanteric fracture of the proximal right femur with varus angulation. Electronically Signed   By: Lucienne Capers M.D.   On: 03/18/2015 05:35    EKG:   Orders placed or performed during the hospital encounter of 03/18/15  . ED EKG  . ED EKG  . EKG 12-Lead  . EKG 12-Lead  . EKG 12-Lead  . EKG 12-Lead  . EKG 12-Lead  . EKG 12-Lead    ASSESSMENT AND PLAN:   81 year old male with past medical history significant for CAD status post CABG, aortic valve replacement surgery with bioprosthetic valve, peripheral neuropathy who is very active at baseline, presents to the hospital secondary to fall and right intertrochanteric fracture.  #1 fall and right hip fracture-for surgery today. -With his history of bypass surgery and valve replacement surgery-cardiology has been consulted. Cardiology has cleared the patient. -Patient denies any chest pain, no arrhythmias. -Further management per orthopedics. Pain control. -postoperative physical therapy and also DVT prophylaxis recommended  #2 CAD status post CABG-continue metoprolol. -Aspirin can be started back after surgery. -Appreciate cardiology consult  #3 neuropathy-continue Neurontin  #4 chronic anemia-monitor after surgery.    All the records are reviewed and case discussed with Care Management/Social Workerr. Management plans discussed with the patient, family and they are in agreement.  CODE STATUS: Full Code  TOTAL TIME TAKING CARE OF THIS PATIENT: 36 minutes.   POSSIBLE D/C IN 1-2 DAYS, DEPENDING ON  CLINICAL CONDITION.   Gladstone Lighter M.D on 03/18/2015 at 12:54 PM  Between 7am to 6pm - Pager - [phone]  After 6pm go to [email or web address] - password [email or web address] [facility]  [facility]  Office  [phone]  CC: Primary care physician; No primary care provider on file.

## 2015-03-18 NOTE — Progress Notes (Signed)
Pt transferred to surgery.  For ORIF RIGHT HIP

## 2015-03-18 NOTE — ED Notes (Signed)
Spent some time at bedside with pt and sister; understands ortho on call is Dr Rudene Christians and he is not allowed to have anything to eat or drink-may be having surgery on hip later today; informed waiting on admitting MD; thermostat adjusted for pt comfort; warm blankets for comfort

## 2015-03-18 NOTE — Transfer of Care (Signed)
Immediate Anesthesia Transfer of Care Note  Patient: Juan Mayo  Procedure(s) Performed: Procedure(s): INTRAMEDULLARY (IM) NAIL INTERTROCHANTRIC (Right)  Patient Location: PACU  Anesthesia Type:General  Level of Consciousness: awake, alert  and oriented  Airway & Oxygen Therapy: Patient Spontanous Breathing and Patient connected to nasal cannula oxygen  Post-op Assessment: Report given to RN and Post -op Vital signs reviewed and stable  Post vital signs: Reviewed and stable  Last Vitals:  Filed Vitals:   03/18/15 1458 03/18/15 1643  BP: 113/47   Pulse: 66   Temp:  36.9 C  Resp: 18     Complications: No apparent anesthesia complications

## 2015-03-18 NOTE — ED Notes (Addendum)
EMS pt from home following a fall; pt was getting up to use he bathroom when he tripped over his own slippers; c/o right hip pain; right leg shortened with external rotation; pt arrived incontinent of stool; skin tear to right elbow; pt says he has never fallen in his life, until tonight;

## 2015-03-18 NOTE — ED Notes (Signed)
Called radiology and informed pt is ready for xrays

## 2015-03-18 NOTE — Progress Notes (Signed)
PT ADMITTED TO FLOOR. S/P FALL WITH RIGHT HIP FX. DR MENZ IN TO EVAL PT. WILL NEED CARDIAC CLEARANCE D/T HX VALVE REPALCEMENT

## 2015-03-18 NOTE — ED Notes (Signed)
Pt's sister just arrived at bedside; pt to radiology for xrays

## 2015-03-19 ENCOUNTER — Encounter: Payer: Self-pay | Admitting: Orthopedic Surgery

## 2015-03-19 LAB — CBC
HCT: 26.9 % — ABNORMAL LOW (ref 40.0–52.0)
Hemoglobin: 8.9 g/dL — ABNORMAL LOW (ref 13.0–18.0)
MCH: 30.7 pg (ref 26.0–34.0)
MCHC: 33.2 g/dL (ref 32.0–36.0)
MCV: 92.6 fL (ref 80.0–100.0)
PLATELETS: 124 10*3/uL — AB (ref 150–440)
RBC: 2.9 MIL/uL — ABNORMAL LOW (ref 4.40–5.90)
RDW: 12.5 % (ref 11.5–14.5)
WBC: 6.7 10*3/uL (ref 3.8–10.6)

## 2015-03-19 LAB — BASIC METABOLIC PANEL
Anion gap: 4 — ABNORMAL LOW (ref 5–15)
BUN: 15 mg/dL (ref 6–20)
CALCIUM: 7.9 mg/dL — AB (ref 8.9–10.3)
CO2: 27 mmol/L (ref 22–32)
CREATININE: 0.84 mg/dL (ref 0.61–1.24)
Chloride: 104 mmol/L (ref 101–111)
GFR calc Af Amer: >60 mL/min (ref >60)
GLUCOSE: 122 mg/dL — AB (ref 65–99)
Potassium: 4.4 mmol/L (ref 3.5–5.1)
Sodium: 135 mmol/L (ref 135–145)

## 2015-03-19 MED ORDER — ASPIRIN 81 MG PO CHEW
81.0000 mg | CHEWABLE_TABLET | Freq: Every day | ORAL | Status: DC
  Administered 2015-03-19 – 2015-03-21 (×3): 81 mg via ORAL
  Filled 2015-03-19 (×3): qty 1

## 2015-03-19 NOTE — Clinical Social Work Note (Signed)
Clinical Social Work Assessment  Patient Details  Name: DEMETRIA LIGHTSEY MRN: [id number] Date of Birth: [birth year]  Date of referral:  03/18/15               Reason for consult:  Facility Placement                Permission sought to share information with:  Case Manager, Customer service manager, Family Supports Permission granted to share information::  Yes, Verbal Permission Granted  Name::        Agency::  [facility]NF[facility]  Relationship::  Sister Levon Hedger [phone]  Contact Information:     Housing/Transportation Living arrangements for the past 2 months:  Single Family Home Source of Information:  Patient, Other (Comment Required) (Younger sister and family friend) Patient Interpreter Needed:  None Criminal Activity/Legal Involvement Pertinent to Current Situation/Hospitalization:  No - Comment as needed Significant Relationships:  Siblings, [employer] Lives with:  Self Do you feel safe going back to the place where you live?  No Need for family participation in patient care:  Yes (Comment)  Care giving concerns:  Pt lives alone, has been to [facility] before -6 years ago after heart repair surgery.   Social Worker assessment / plan:  CSW met with Pt, his sister, and a close family friend in his room. Pt is retired Furniture conservator/restorer, has been widowed for 13 years after 40 years of marriage. Pt and his wife did not have any children. He is 1 of 7 children, has a large supportive extended family. Pt is very pleasant, talks about his hobbies, missing his wife, and plans for getting out of rehab. Recommendation is for STR at SNF at dc. Pt has been to [facility] before and prefers to go there again. CSW will begin SNF bed search for anticipated dc on Wednesday.   Employment status:  Retired Nurse, adult PT Recommendations:  [place] / Referral to community resources:  [place]  Patient/Family's Response  to care:  Family is very supportive of Pt and plans for rehab at [facility].   Patient/Family's Understanding of and Emotional Response to Diagnosis, Current Treatment, and Prognosis: Pt and his family are calm and feel that Pt will return to baseline after rehab.   Emotional Assessment Appearance:  Appears stated age, Disheveled Attitude/Demeanor/Rapport:   (calm, pleasant) Affect (typically observed):  Accepting, Adaptable Orientation:  Oriented to Self, Oriented to Place, Oriented to  Time, Oriented to Situation Alcohol / Substance use:  Never Used Psych involvement (Current and /or in the community):  No (Comment)  Discharge Needs  Concerns to be addressed:  Adjustment to Illness Readmission within the last 30 days:  No Current discharge risk:  Dependent with Mobility, Lives alone, Physical Impairment Barriers to Discharge:  Continued Medical Work up   [employer], LCSW 03/19/2015, 3:54 PM

## 2015-03-19 NOTE — Progress Notes (Signed)
   Subjective: 1 Day Post-Op Procedure(s) (LRB): INTRAMEDULLARY (IM) NAIL INTERTROCHANTRIC (Right) Patient reports pain as mild.   Patient is well, and has had no acute complaints or problems We will start therapy today.  No CP, SOB, abd pain   Objective: Vital signs in last 24 hours: Temp:  [97.5 F (36.4 C)-98.9 F (37.2 C)] 97.8 F (36.6 C) (12/19 0412) Pulse Rate:  [60-114] 114 (12/19 0412) Resp:  [13-36] 18 (12/19 0412) BP: (103-140)/(40-62) 119/54 mmHg (12/19 0412) SpO2:  [93 %-100 %] 97 % (12/19 0412) Weight:  [130 lb 9.6 oz (59.24 kg)] 130 lb 9.6 oz (59.24 kg) (12/19 0523)  Intake/Output from previous day: 12/18 0701 - 12/19 0700 In: 1528.8 [I.V.:1528.8] Out: 1250 [Urine:1200; Blood:50] Intake/Output this shift:     Recent Labs  03/18/15 0409 03/19/15 0430  HGB 12.6* 8.9*    Recent Labs  03/18/15 0409 03/19/15 0430  WBC 10.4 6.7  RBC 4.06* 2.90*  HCT 37.6* 26.9*  PLT 200 124*    Recent Labs  03/18/15 0409 03/19/15 0430  NA 137 135  K 4.4 4.4  CL 101 104  CO2 31 27  BUN 19 15  CREATININE 0.91 0.84  GLUCOSE 123* 122*  CALCIUM 8.9 7.9*    Recent Labs  03/18/15 0409  INR 1.09    EXAM General - Patient is Alert, Appropriate and Oriented Extremity - Neurovascular intact Sensation intact distally Intact pulses distally Dorsiflexion/Plantar flexion intact Dressing - dressing C/D/I and scant drainage Motor Function - intact, moving foot and toes well on exam.   Past Medical History  Diagnosis Date  . Prostate cancer ([place])   . Hypercholesteremia   . Neuropathy ([facility])     Assessment/Plan:   1 Day Post-Op Procedure(s) (LRB): INTRAMEDULLARY (IM) NAIL INTERTROCHANTRIC (Right) Active Problems:   Hip fracture ([facility])   Acute post op blood loss anemia    Estimated body mass index is 20.14 kg/(m^2) as calculated from the following:   Height as of this encounter: 5' 7.5" (1.715 m).   Weight as of this encounter: 130 lb 9.6 oz (59.24  kg). Advance diet Up with therapy  Needs BM Recheck labs in the am   DVT Prophylaxis - Lovenox, Foot Pumps and TED hose Weight-Bearing as tolerated to right leg D/C O2 and Pulse OX and try on Room Air  T. Rachelle Hora, PA-C [place] 03/19/2015, 7:13 AM

## 2015-03-19 NOTE — Plan of Care (Signed)
Problem: Activity: Goal: Ability to ambulate and perform ADLs will improve Outcome: Progressing Up to chair with Physical Therapy this shift  Problem: Physical Regulation: Goal: Will remain free from infection Outcome: Progressing Post operative Antibiotic course completed  Problem: Bowel/Gastric: Goal: Will not experience complications related to bowel motility Outcome: Progressing Laxative given

## 2015-03-19 NOTE — Evaluation (Signed)
Physical Therapy Evaluation Patient Details Name: Juan Mayo MRN: [id number] DOB: Apr 03, 1921 Today's Date: 03/19/2015   History of Present Illness  Patient is a 81 y/o male that presents after a mechanical fall in his bathroom at home with subsequent R intertrochanteric fx.   Clinical Impression  Patient is very active at his baseline, however he is experiencing mild-moderate R hip pain s/p ORIF. Patient demonstrates LE buckling/weakness, though is able to take several small steps to transfer from bed to chair. Patient lives alone at baseline and is unable to complete bed mobility, transfers, or ambulation without assistance currently. Patient provides excellent motivation and participation with PT during this session and would continue to benefit from skilled PT services.     Follow Up Recommendations SNF    Equipment Recommendations  Rolling walker with 5" wheels    Recommendations for Other Services       Precautions / Restrictions Precautions Precautions: Fall Restrictions Weight Bearing Restrictions: Yes RLE Weight Bearing: Weight bearing as tolerated      Mobility  Bed Mobility Overal bed mobility: Needs Assistance Bed Mobility: Supine to Sit     Supine to sit: Min assist;Mod assist     General bed mobility comments: Patient requires assistance to bring LEs to edge of bed secondary to pain/generalized weakness. He is able to lift his trunk off bed surface independently.   Transfers Overall transfer level: Needs assistance Equipment used: Rolling walker (2 wheeled) Transfers: Sit to/from Stand Sit to Stand: Mod assist         General transfer comment: Patient is able to transfer sit to stand with cuing for hand placement with RW by pushing off from the bed bilaterally, significant trunk flexion noted, though no overt loss of balance.   Ambulation/Gait Ambulation/Gait assistance: Min assist Ambulation Distance (Feet): 3 Feet Assistive device: Rolling walker  (2 wheeled) Gait Pattern/deviations: Step-to pattern;Decreased step length - left;Decreased step length - right;Antalgic;Narrow base of support;Trunk flexed   Gait velocity interpretation: Below normal speed for age/gender General Gait Details: Patient ambulates with bilateral knee flexion secondary to pain and weakness, no overt loss of balance, though he requires physical assistance for navigating RW.   Stairs            Wheelchair Mobility    Modified Rankin (Stroke Patients Only)       Balance Overall balance assessment: Needs assistance Sitting-balance support: Bilateral upper extremity supported Sitting balance-Leahy Scale: Fair Sitting balance - Comments: Patient demonstrates no balance deficits in sitting aside from use of bed rail.    Standing balance support: Bilateral upper extremity supported Standing balance-Leahy Scale: Poor Standing balance comment: Patient demonstrates quadricep weakness in standing as well as posterior lean with RW.                              Pertinent Vitals/Pain Pain Assessment:  (Reports little pain at rest, increases with movement)    Home Living Family/patient expects to be discharged to:: Skilled nursing facility Living Arrangements: Alone Available Help at Discharge: Family (66 year old brother) Type of Home: House Home Access: Stairs to enter Entrance Stairs-Rails:  (can reach one rail) Secretary/administrator of Steps: 6 Home Layout: One level (with a basement) Home Equipment: Walker - 2 wheels      Prior Function Level of Independence: Independent with assistive device(s)         Comments: Occasional use of rolling walker, still drives and mows  lawn.      Hand Dominance        Extremity/Trunk Assessment   Upper Extremity Assessment:  (hip kit)           Lower Extremity Assessment: Defer to PT evaluation         Communication   Communication: No difficulties  Cognition Arousal/Alertness:  Awake/alert Behavior During Therapy: WFL for tasks assessed/performed Overall Cognitive Status: Within Functional Limits for tasks assessed                      General Comments      Exercises General Exercises - Lower Extremity Long Arc Quad: AROM;AAROM;Both;10 reps Heel Slides: AROM;AAROM;Both;10 reps Hip ABduction/ADduction: AROM;AAROM;Both;10 reps Straight Leg Raises: AROM;AAROM;Both;10 reps Hip Flexion/Marching: AROM;AAROM;Both;10 reps;Seated      Assessment/Plan    PT Assessment Patient needs continued PT services  PT Diagnosis Difficulty walking;Abnormality of gait;Generalized weakness;Acute pain   PT Problem List Decreased strength;Decreased knowledge of use of DME;Decreased safety awareness;Decreased activity tolerance;Decreased knowledge of precautions;Decreased balance;Cardiopulmonary status limiting activity;Decreased mobility  PT Treatment Interventions DME instruction;Gait training;Stair training;Therapeutic activities;Therapeutic exercise;Balance training   PT Goals (Current goals can be found in the Care Plan section) Acute Rehab PT Goals Patient Stated Goal: To go home and resume activity PT Goal Formulation: With patient Time For Goal Achievement: 04/02/15 Potential to Achieve Goals: Good    Frequency BID   Barriers to discharge Decreased caregiver support;Inaccessible home environment Patient lives alone with 6 steps and no assistance.     Co-evaluation               End of Session Equipment Utilized During Treatment: Gait belt Activity Tolerance: Patient tolerated treatment well;Patient limited by pain (Brief period where he became acutely dizzy/decreased responsiveness? Aroused within 5 seconds and returned to baseline. RN in room with PT) Patient left: in chair;with call bell/phone within reach;with chair alarm set Nurse Communication: Mobility status         Time: [phone] PT Time Calculation (min) (ACUTE ONLY): 23  min   Charges:   PT Evaluation $Initial PT Evaluation Tier I: 1 Procedure PT Treatments $Therapeutic Exercise: 8-22 mins   PT G Codes:       Kerman Passey, PT, DPT    03/19/2015, 1:46 PM

## 2015-03-19 NOTE — Progress Notes (Signed)
[facility] Cardiology [facility], [place] Encounter Note  Patient: ZOLAN UTTLEY / Admit Date: 03/18/2015 / Date of Encounter: 03/19/2015, 9:32 AM   Subjective: Patient diong well after surgery. No chf or cp. Some leg pain after surgery  Review of Systems: Positive for:leg pain Negative for: Vision change, hearing change, syncope, dizziness, nausea, vomiting,diarrhea, bloody stool, stomach pain, cough, congestion, diaphoresis, urinary frequency, urinary pain,skin lesions, skin rashes Others previously listed  Objective: Telemetry: nsr Physical Exam: Blood pressure 105/44, pulse 69, temperature 98.2 F (36.8 C), temperature source Oral, resp. rate 18, height 5' 7.5" (1.715 m), weight 130 lb 9.6 oz (59.24 kg), SpO2 99 %. Body mass index is 20.14 kg/(m^2). General: Well developed, well nourished, in no acute distress. Head: Normocephalic, atraumatic, sclera non-icteric, no xanthomas, nares are without discharge. Neck: No apparent masses Lungs: Normal respirations with no wheezes, no rhonchi, no rales , no crackles   Heart: Regular rate and rhythm, normal S1 soft S2, 2-3+aortic  murmur, no rub, no gallop, PMI is normal size and placement, carotid upstroke normal with  bruit, jugular venous pressure normal Abdomen: Soft, non-tender, non-distended with normoactive bowel sounds. No hepatosplenomegaly. Abdominal aorta is normal size without bruit Extremities: No edema, no clubbing, no cyanosis, no ulcers,  Peripheral: 2+ radial, 2+ femoral, 2+ dorsal pedal pulses Neuro: Alert and oriented. Moves all extremities spontaneously. Psych:  Responds to questions appropriately with a normal affect.   Intake/Output Summary (Last 24 hours) at 03/19/15 0932 Last data filed at 03/19/15 0606  Gross per 24 hour  Intake 1528.75 ml  Output   1250 ml  Net 278.75 ml    Inpatient Medications:  .  ceFAZolin (ANCEF) IV  1 g Intravenous Q6H  . docusate sodium  100 mg Oral BID  . enoxaparin (LOVENOX) injection  40  mg Subcutaneous Q24H  . gabapentin  300 mg Oral Daily  . metoprolol tartrate  25 mg Oral BID  . sodium chloride  3 mL Intravenous Q12H   Infusions:  . sodium chloride 75 mL/hr at 03/18/15 1817  . dextrose 5 % and 0.9% NaCl      Labs:  Recent Labs  03/18/15 0409 03/19/15 0430  NA 137 135  K 4.4 4.4  CL 101 104  CO2 31 27  GLUCOSE 123* 122*  BUN 19 15  CREATININE 0.91 0.84  CALCIUM 8.9 7.9*    Recent Labs  03/18/15 0409  AST 20  ALT 15*  ALKPHOS 91  BILITOT 0.6  PROT 6.8  ALBUMIN 4.0    Recent Labs  03/18/15 0409 03/19/15 0430  WBC 10.4 6.7  NEUTROABS 8.9*  --   HGB 12.6* 8.9*  HCT 37.6* 26.9*  MCV 92.4 92.6  PLT 200 124*   No results for input(s): CKTOTAL, CKMB, TROPONINI in the last 72 hours. Invalid input(s): POCBNP No results for input(s): HGBA1C in the last 72 hours.   Weights: Filed Weights   03/18/15 0351 03/19/15 0523  Weight: 125 lb (56.7 kg) 130 lb 9.6 oz (59.24 kg)     Radiology/Studies:  Dg Chest 1 View  03/18/2015  CLINICAL DATA:  Slip and fall injury. EXAM: CHEST 1 VIEW COMPARISON:  None. FINDINGS: Postoperative changes in the mediastinum. Emphysematous changes and scattered fibrosis in the lungs. Normal heart size and pulmonary vascularity. No focal airspace disease or consolidation. No blunting of costophrenic angles. No pneumothorax. Degenerative changes in the spine. Visualize ribs are grossly intact. IMPRESSION: Emphysematous changes and fibrosis in the lungs. No evidence of active pulmonary disease.  Electronically Signed   By: Lucienne Capers M.D.   On: 03/18/2015 05:36   Dg Hip Operative Unilat With Pelvis Right  03/18/2015  CLINICAL DATA:  Operative imaging during right proximal femur fracture ORIF. EXAM: OPERATIVE RIGHT HIP (WITH PELVIS IF PERFORMED) 3 VIEWS TECHNIQUE: Fluoroscopic spot image(s) were submitted for interpretation post-operatively. COMPARISON:  03/18/2015 at 4:34 a.m. FINDINGS: The intertrochanteric fracture of the  right proximal femur has been reduced with a compression screw and intra medullary rod. The major fracture components are in near anatomic alignment. Orthopedic hardware is well-seated. There is no new fracture or evidence of an operative complication. IMPRESSION: Well aligned major fracture fragments following ORIF of a proximal right femur fracture. Electronically Signed   By: Lajean Manes M.D.   On: 03/18/2015 17:28   Dg Hip Unilat With Pelvis 2-3 Views Right  03/18/2015  CLINICAL DATA:  Patient slipped and fell. Pain to the right lateral hip. EXAM: DG HIP (WITH OR WITHOUT PELVIS) 2-3V RIGHT COMPARISON:  None. FINDINGS: There is a comminuted inter trochanteric fracture of the proximal right femur with varus angulation of the fracture fragments. No dislocation of the hip. Diffuse bone demineralization. Pelvis appears intact. Degenerative changes in the lower lumbar spine and both hips. Vascular calcifications. IMPRESSION: Acute comminuted inter trochanteric fracture of the proximal right femur with varus angulation. Electronically Signed   By: Lucienne Capers M.D.   On: 03/18/2015 05:35     Assessment and Recommendation  81 y.o. male with known aortic stenosis sp valve replacement stable without chf or angina sp orthopedic surgery and improving without new cardiac sx 1. No further cardic diagnositcs at this time 2. Metoprolol for hr and bp control 3. No restriction to rehab 4. Call if further quwtions Signed, Serafina Royals M.D. FACC

## 2015-03-19 NOTE — Progress Notes (Signed)
[facility] [place] NAME: Juan Mayo    MR#:  [id number]  DATE OF BIRTH:  November 03, 1921  SUBJECTIVE:  CHIEF COMPLAINT:   Chief Complaint  Patient presents with  . Fall  . Hip Pain   - right intertrochanteric fracture  REVIEW OF SYSTEMS:  Review of Systems  Constitutional: Negative for fever and chills.  HENT: Negative for ear discharge, ear pain and nosebleeds.   Eyes: Negative for blurred vision.  Respiratory: Negative for cough, shortness of breath and wheezing.   Cardiovascular: Negative for chest pain and palpitations.  Gastrointestinal: Negative for nausea, vomiting, abdominal pain, diarrhea and constipation.  Genitourinary: Negative for dysuria.  Musculoskeletal: Positive for joint pain. Negative for myalgias.       Right hip pain  Neurological: Negative for dizziness, sensory change, speech change, focal weakness, seizures, weakness and headaches.  Endo/Heme/Allergies: Does not bruise/bleed easily.  Psychiatric/Behavioral: Negative for depression.    DRUG ALLERGIES:  No Known Allergies  VITALS:  Blood pressure 101/44, pulse 86, temperature 97.5 F (36.4 C), temperature source Oral, resp. rate 18, height 5' 7.5" (1.715 m), weight 59.24 kg (130 lb 9.6 oz), SpO2 97 %.  PHYSICAL EXAMINATION:  Physical Exam  GENERAL:  81 y.o.-year-old patient lying in the bed with no acute distress.  EYES: Pupils equal, round, reactive to light and accommodation. No scleral icterus. Extraocular muscles intact.  HEENT: Head atraumatic, normocephalic. Oropharynx and nasopharynx clear.  NECK:  Supple, no jugular venous distention. No thyroid enlargement, no tenderness.  LUNGS: Normal breath sounds bilaterally, no wheezing, rales,rhonchi or crepitation. No use of accessory muscles of respiration. Decreased bibasilar breath sounds. CARDIOVASCULAR: S1, S2 normal. No rubs, or gallops. Loud 3/6 systolic murmur in aortic area. ABDOMEN: Soft,  nontender, nondistended. Bowel sounds present. No organomegaly or mass.  EXTREMITIES: No pedal edema, cyanosis, or clubbing.  NEUROLOGIC: Cranial nerves II through XII are intact. Muscle strength 5/5 in all extremities.Limited right leg movement due to pain from fracture. Sensation intact. Gait not checked.  PSYCHIATRIC: The patient is alert and oriented x 3.  SKIN: No obvious rash, lesion, or ulcer.    LABORATORY PANEL:   CBC  Recent Labs Lab 03/19/15 0430  WBC 6.7  HGB 8.9*  HCT 26.9*  PLT 124*   ------------------------------------------------------------------------------------------------------------------  Chemistries   Recent Labs Lab 03/18/15 0409 03/19/15 0430  NA 137 135  K 4.4 4.4  CL 101 104  CO2 31 27  GLUCOSE 123* 122*  BUN 19 15  CREATININE 0.91 0.84  CALCIUM 8.9 7.9*  AST 20  --   ALT 15*  --   ALKPHOS 91  --   BILITOT 0.6  --    ------------------------------------------------------------------------------------------------------------------  Cardiac Enzymes No results for input(s): TROPONINI in the last 168 hours. ------------------------------------------------------------------------------------------------------------------  RADIOLOGY:  Dg Chest 1 View  03/18/2015  CLINICAL DATA:  Slip and fall injury. EXAM: CHEST 1 VIEW COMPARISON:  None. FINDINGS: Postoperative changes in the mediastinum. Emphysematous changes and scattered fibrosis in the lungs. Normal heart size and pulmonary vascularity. No focal airspace disease or consolidation. No blunting of costophrenic angles. No pneumothorax. Degenerative changes in the spine. Visualize ribs are grossly intact. IMPRESSION: Emphysematous changes and fibrosis in the lungs. No evidence of active pulmonary disease. Electronically Signed   By: Lucienne Capers M.D.   On: 03/18/2015 05:36   Dg Hip Operative Unilat With Pelvis Right  03/18/2015  CLINICAL DATA:  Operative imaging during right proximal  femur fracture ORIF.  EXAM: OPERATIVE RIGHT HIP (WITH PELVIS IF PERFORMED) 3 VIEWS TECHNIQUE: Fluoroscopic spot image(s) were submitted for interpretation post-operatively. COMPARISON:  03/18/2015 at 4:34 a.m. FINDINGS: The intertrochanteric fracture of the right proximal femur has been reduced with a compression screw and intra medullary rod. The major fracture components are in near anatomic alignment. Orthopedic hardware is well-seated. There is no new fracture or evidence of an operative complication. IMPRESSION: Well aligned major fracture fragments following ORIF of a proximal right femur fracture. Electronically Signed   By: Lajean Manes M.D.   On: 03/18/2015 17:28   Dg Hip Unilat With Pelvis 2-3 Views Right  03/18/2015  CLINICAL DATA:  Patient slipped and fell. Pain to the right lateral hip. EXAM: DG HIP (WITH OR WITHOUT PELVIS) 2-3V RIGHT COMPARISON:  None. FINDINGS: There is a comminuted inter trochanteric fracture of the proximal right femur with varus angulation of the fracture fragments. No dislocation of the hip. Diffuse bone demineralization. Pelvis appears intact. Degenerative changes in the lower lumbar spine and both hips. Vascular calcifications. IMPRESSION: Acute comminuted inter trochanteric fracture of the proximal right femur with varus angulation. Electronically Signed   By: Lucienne Capers M.D.   On: 03/18/2015 05:35    EKG:   Orders placed or performed during the hospital encounter of 03/18/15  . ED EKG  . ED EKG  . EKG 12-Lead  . EKG 12-Lead  . EKG 12-Lead  . EKG 12-Lead  . EKG 12-Lead  . EKG 12-Lead    ASSESSMENT AND PLAN:   81 year old male with past medical history significant for CAD status post CABG, aortic valve replacement surgery with bioprosthetic valve, peripheral neuropathy who is very active at baseline, presents to the hospital secondary to fall and right intertrochanteric fracture.  #1 Fall and right hip fracture- POD #1 today -With his history of  bypass surgery and valve replacement surgery-but stable. -Patient denies any chest pain, no arrhythmias. - Pain control. Physical therapy -rehab at discharge  #2 CAD status post CABG-continue metoprolol. -Aspirin started after surgery. Also on metoprolol -Appreciate cardiology consult  #3 neuropathy-continue Neurontin  #4 Acute post op anemia on chronic anemia- hb dropped from 12.6-8.9 after the surgery. -No active bleeding at this time. Patient is not tachycardic and blood pressure is stable. -No indication for transfusion unless hemoglobin is less than 7. Monitor at this time.   #5 DVT Prophylaxis- Lovenox   All the records are reviewed and case discussed with Care Management/Social Workerr. Management plans discussed with the patient, family and they are in agreement.  CODE STATUS: Full Code  TOTAL TIME TAKING CARE OF THIS PATIENT: 36 minutes.   POSSIBLE D/C IN 1-2 DAYS, DEPENDING ON CLINICAL CONDITION.   Cottrell Gentles M.D on 03/19/2015 at 1:20 PM  Between 7am to 6pm - Pager - [phone]  After 6pm go to [email or web address] - password [email or web address] [facility]  [facility]  Office  [phone]  CC: Primary care physician; No primary care provider on file.

## 2015-03-19 NOTE — Progress Notes (Signed)
Patient cooperative with staff and PT this shift.  Up to chair during am session and walked to door this afternoon.  Patient lives alone and wishes to discharge to [facility] for rehab if be available.  Swelling to hip decreased this shift.  Difficulty taking tablets whole, crushed meds and gave with applesauce.  Advised patient to order food that he could tolerate.  No coughing with meds during my shift.

## 2015-03-19 NOTE — Care Management Note (Addendum)
Case Management Note  Patient Details  Name: Juan Mayo MRN: [id number] Date of Birth: 1922/02/07  Subjective/Objective:   [name] was admitted 03/18/15 after a fall at home. He received a right hip nailing the same day. Mr Juan Mayo reports that he has a PCP but cannot remember his name. [facility] [address]. Reports that he lives alone and has a rolling walker. No home oxygen or home health services. Mr Juan Mayo drove himself to appointments prior to this hospitalization. OT is recommending Rehab. Discussed this with Mr Juan Mayo who is agreeable to go to Rehab for a few weeks after discharge. Juan Mayo- Social Work will follow for placement.                 Action/Plan:   Expected Discharge Date:                  Expected Discharge Plan:     In-House Referral:     Discharge planning Services     Post Acute Care Choice:    Choice offered to:     DME Arranged:    DME Agency:     HH Arranged:    Post [place] Agency:     Status of Service:     Medicare Important Message Given:    Date Medicare IM Given:    Medicare IM give by:    Date Additional Medicare IM Given:    Additional Medicare Important Message give by:     If discussed at [place] of Stay Meetings, dates discussed:    Additional Comments:  Sherika Kubicki A, RN 03/19/2015, 12:51 PM

## 2015-03-19 NOTE — Progress Notes (Signed)
Physical Therapy Treatment Patient Details Name: Juan Mayo MRN: [id number] DOB: Aug 04, 1921 Today's Date: 03/19/2015    History of Present Illness Patient is a 81 y/o male that presents after a mechanical fall in his bathroom at home with subsequent R intertrochanteric fx.     PT Comments    Patient reports less pain in this session and does not have any change in alertness throughout the session. Patient is able to walk to the door and back (with a rest break halfway) with appropriate step length, sequencing, and no loss of balance/deficits noted in this session. He continues to fatigue quickly, though does not display any LE buckling in this session, he appears to be progressing well towards established mobility goals.   Follow Up Recommendations  SNF     Equipment Recommendations  Rolling walker with 5" wheels    Recommendations for Other Services       Precautions / Restrictions Precautions Precautions: Fall Restrictions Weight Bearing Restrictions: Yes RLE Weight Bearing: Weight bearing as tolerated    Mobility  Bed Mobility Overal bed mobility: Needs Assistance Bed Mobility: Sit to Supine       Sit to supine: Min guard;Min assist   General bed mobility comments: Patient asks to transfer himself and requires minimal assistance to bring LEs over the edge of the bed, no requirements for trunk mobility.   Transfers Overall transfer level: Needs assistance Equipment used: Rolling walker (2 wheeled) Transfers: Sit to/from Stand Sit to Stand: Min guard;Min assist         General transfer comment: Patient requires minimal assistance with standing as he demonstrates good effort, thouigh prolonged effort to complete.   Ambulation/Gait   Ambulation Distance (Feet):  (12' + 12') Assistive device: Rolling walker (2 wheeled) Gait Pattern/deviations: Decreased step length - left;Step-to pattern;Decreased step length - right;Trunk flexed;Narrow base of support   Gait  velocity interpretation: Below normal speed for age/gender General Gait Details: Patient is able to perform ambulation with decreased speed, however appropriate sequencing and no loss of balance during ambulation.    Stairs            Wheelchair Mobility    Modified Rankin (Stroke Patients Only)       Balance Overall balance assessment: Needs assistance   Sitting balance-Leahy Scale: Fair Sitting balance - Comments: Patient demonstrates no balance deficits in sitting aside from use of bed rail.    Standing balance support: Bilateral upper extremity supported Standing balance-Leahy Scale: Fair Standing balance comment: Decreased buckling noted in this session.                     Cognition Arousal/Alertness: Awake/alert Behavior During Therapy: WFL for tasks assessed/performed Overall Cognitive Status: Within Functional Limits for tasks assessed                      Exercises General Exercises - Lower Extremity Ankle Circles/Pumps: AROM;Both;10 reps Gluteal Sets: AROM;Both;10 reps Long Arc Quad: AROM;AAROM;Both;10 reps Heel Slides: AROM;AAROM;Both;10 reps Straight Leg Raises: AROM;AAROM;Both;10 reps    General Comments        Pertinent Vitals/Pain Pain Assessment:  (Patient reports pain has subsided some from the morning and agrees to ambulation)    Home Living Family/patient expects to be discharged to:: Skilled nursing facility Living Arrangements: Alone Available Help at Discharge: Family (57 year old brother) Type of Home: House Home Access: Stairs to enter Entrance Stairs-Rails:  (can reach one rail) Home Layout: One level (with  a basement) Home Equipment: Walker - 2 wheels      Prior Function Level of Independence: Independent with assistive device(s)      Comments: Occasional use of rolling walker, still drives and mows lawn.    PT Goals (current goals can now be found in the care plan section) Acute Rehab PT Goals Patient Stated  Goal: To go home and resume activity PT Goal Formulation: With patient Time For Goal Achievement: 04/02/15 Potential to Achieve Goals: Good Progress towards PT goals: Progressing toward goals    Frequency  BID    PT Plan Current plan remains appropriate    Co-evaluation             End of Session Equipment Utilized During Treatment: Gait belt Activity Tolerance: Patient tolerated treatment well Patient left: in bed;with call bell/phone within reach;with bed alarm set     Time: [id number] PT Time Calculation (min) (ACUTE ONLY): 21 min  Charges:  $Gait Training: 8-22 mins                    G Codes:      Kerman Passey, PT, DPT    03/19/2015, 3:11 PM

## 2015-03-19 NOTE — NC FL2 (Signed)
[place] LEVEL OF CARE SCREENING TOOL     IDENTIFICATION  Patient Name: Juan Mayo Birthdate: [birth year] Sex: male Admission Date (Current Location): 03/18/2015  [facility] and [place] Number:  Engineering geologist and Address:  [facility], [address] [place], [place] [address]      Provider Number: [id number]  Attending Physician Name and Address:  Gladstone Lighter, MD  Relative Name and Phone Number:       Current Level of Care: Hospital Recommended Level of Care: [place] Prior Approval Number:    Date Approved/Denied:   PASRR Number:    Discharge Plan: SNF    Current Diagnoses: Patient Active Problem List   Diagnosis Date Noted  . Hip fracture ([place]) 03/18/2015   Past Medical History  Diagnosis Date  . Prostate cancer ([place])   . Hypercholesteremia   . Neuropathy [facility])     Past Surgical History  Procedure Laterality Date  . Replacement total knee bilateral    . Coronary artery bypass graft    . Back surgery    . Aortic valve replacement    . Prostatectomy          Orientation RESPIRATION BLADDER Height & Weight    Self, Time, Situation, Place  Normal Continent 5\' 7"  (170.2 cm) 130 lbs.  BEHAVIORAL SYMPTOMS/MOOD NEUROLOGICAL BOWEL NUTRITION STATUS      Continent Diet (Heart Healthy )  AMBULATORY STATUS COMMUNICATION OF NEEDS Skin   Extensive Assist Verbally Surgical wounds                       Personal Care Assistance Level of Assistance  Bathing, Feeding, Dressing Bathing Assistance: Maximum assistance Feeding assistance: Maximum assistance Dressing Assistance: Maximum assistance     Functional Limitations Info  Sight, Hearing, Speech Sight Info: Adequate Hearing Info: Adequate Speech Info: Adequate    SPECIAL CARE FACTORS FREQUENCY  OT (By licensed OT), PT (By licensed PT)     PT Frequency: 5x week OT Frequency: 3x week             Contractures Contractures Info: Not present    Additional Factors Info  Code Status, Allergies Code Status Info: Full Code Allergies Info: KNA           Current Medications (03/19/2015):  This is the current hospital active medication list Current Facility-Administered Medications  Medication Dose Route Frequency Provider Last Rate Last Dose  . 0.9 %  sodium chloride infusion   Intravenous Continuous [name], MD 75 mL/hr at 03/18/15 1817    . acetaminophen (TYLENOL) tablet 650 mg  650 mg Oral Q6H PRN Harrie Foreman, MD       Or  . acetaminophen (TYLENOL) suppository 650 mg  650 mg Rectal Q6H PRN Harrie Foreman, MD      . alum & mag hydroxide-simeth (MAALOX/MYLANTA) 200-200-20 MG/5ML suspension 30 mL  30 mL Oral Q4H PRN [name], MD      . aspirin chewable tablet 81 mg  81 mg Oral Daily Gladstone Lighter, MD   81 mg at 03/19/15 1457  . bisacodyl (DULCOLAX) suppository 10 mg  10 mg Rectal Daily PRN [name], MD      . docusate sodium (COLACE) capsule 100 mg  100 mg Oral BID Harrie Foreman, MD   100 mg at 03/18/15 2201  . enoxaparin (LOVENOX) injection 40 mg  40 mg Subcutaneous Q24H [name], MD   40 mg at  03/19/15 0953  . gabapentin (NEURONTIN) capsule 300 mg  300 mg Oral Daily Harrie Foreman, MD   300 mg at 03/19/15 0953  . HYDROcodone-acetaminophen (NORCO/VICODIN) 5-325 MG per tablet 1-2 tablet  1-2 tablet Oral Q6H PRN [name], MD   1 tablet at 03/19/15 0955  . magnesium citrate solution 1 Bottle  1 Bottle Oral Once PRN [name], MD      . magnesium hydroxide (MILK OF MAGNESIA) suspension 30 mL  30 mL Oral Daily PRN [name], MD   30 mL at 03/19/15 0953  . menthol-cetylpyridinium (CEPACOL) lozenge 3 mg  1 lozenge Oral PRN [name], MD       Or  . phenol [facility]) mouth spray 1 spray  1 spray Mouth/Throat PRN [name], MD      . metoCLOPramide (REGLAN) tablet 5-10 mg  5-10 mg Oral Q8H PRN [name], MD       Or  .  metoCLOPramide (REGLAN) injection 5-10 mg  5-10 mg Intravenous Q8H PRN [name], MD      . metoprolol tartrate (LOPRESSOR) tablet 25 mg  25 mg Oral BID Harrie Foreman, MD   25 mg at 03/18/15 2202  . morphine 2 MG/ML injection 1 mg  1 mg Intravenous Q1H PRN [name], MD   1 mg at 03/18/15 1428  . ondansetron (ZOFRAN) tablet 4 mg  4 mg Oral Q6H PRN Harrie Foreman, MD   4 mg at 03/19/15 [id number]   Or  . ondansetron [facility]) injection 4 mg  4 mg Intravenous Q6H PRN Harrie Foreman, MD      . sodium chloride 0.9 % injection 3 mL  3 mL Intravenous Q12H Harrie Foreman, MD   3 mL at 03/19/15 [id number]     Discharge Medications: Please see discharge summary for a list of discharge medications.  Relevant Imaging Results:  Relevant Lab Results:   Additional Information    Alonna Buckler, LCSW

## 2015-03-19 NOTE — Anesthesia Postprocedure Evaluation (Signed)
Anesthesia Post Note  Patient: Juan Mayo  Procedure(s) Performed: Procedure(s) (LRB): INTRAMEDULLARY (IM) NAIL INTERTROCHANTRIC (Right)  Patient location during evaluation: Nursing Unit Anesthesia Type: Spinal Level of consciousness: awake and oriented Pain management: pain level controlled Vital Signs Assessment: post-procedure vital signs reviewed and stable Respiratory status: spontaneous breathing and respiratory function stable Cardiovascular status: blood pressure returned to baseline and stable Postop Assessment: no headache Anesthetic complications: no    Last Vitals:  Filed Vitals:   03/19/15 0412 03/19/15 0736  BP: 119/54 105/44  Pulse: 114 69  Temp: 36.6 C 36.8 C  Resp: 18 18    Last Pain:  Filed Vitals:   03/19/15 0736  PainSc: Asleep                 Ferrero[place],  Diego Cory

## 2015-03-19 NOTE — Evaluation (Signed)
Occupational Therapy Evaluation Patient Details Name: Juan Mayo MRN: [id number] DOB: [birth year] Today's Date: 03/19/2015    History of Present Illness This patient is a 81 year old male who came to [facility]  after a mechanical fall in his bathroom at home suffering a R intertrochanteric fx. He received an open reduction with internal fixation repair.   Clinical Impression   This patient is a 81 year old male with the above history. He lives alone in a one story home with a basement and had been independent with activities of daily living  And mobility with occasional walker use. This includes driving and mowing his lawn. He now has activities of daily living deficits as listed and would benefit from Occupational Therapy for ADL/functioal mobility training.    Follow Up Recommendations  SNF    Equipment Recommendations       Recommendations for Other Services       Precautions / Restrictions Precautions Precautions: Fall Restrictions Weight Bearing Restrictions: Yes RLE Weight Bearing: Weight bearing as tolerated      Mobility Bed Mobility   Transfers    Balance                            ADL                                         General ADL Comments: Patient had been independent with ADL and mobility, still driving and mowing lawn. He now needs moderate assist to Donned/doffed socks and pants to knees. Practiced techniques for lower body dressing using hip kit with moderate assist and verbal cues for technique and safety. Instructed that he may not need hip kit in a few days as he has no restrictions regarding hip position.     Vision     Perception     Praxis      Pertinent Vitals/Pain Pain Assessment:  (Reports little pain at rest, increases with movement)     Hand Dominance     Extremity/Trunk Assessment Upper Extremity Assessment. B UE with in functional limits.   Lower Extremity  Assessment Lower Extremity Assessment: Defer to PT evaluation       Communication Communication Communication: No difficulties   Cognition Arousal/Alertness: Awake/alert Behavior During Therapy: WFL for tasks assessed/performed Overall Cognitive Status: Within Functional Limits for tasks assessed                     General Comments       Exercises       Shoulder Instructions      Home Living Family/patient expects to be discharged to:: Skilled nursing facility Living Arrangements: Alone Available Help at Discharge: Family (28 year old brother) Type of Home: House Home Access: Stairs to enter Secretary/administrator of Steps: 6 Entrance Stairs-Rails:  (can reach one rail) Home Layout: One level (with a basement)               Home Equipment: Walker - 2 wheels          Prior Functioning/Environment Level of Independence: Independent with assistive device(s)        Comments: Occasional use of rolling walker, still drives and mows lawn.     OT Diagnosis: Acute pain   OT Problem List: Decreased range of motion;Decreased activity tolerance;Impaired balance (sitting  and/or standing);Decreased knowledge of use of DME or AE;Pain   OT Treatment/Interventions: Self-care/ADL training    OT Goals(Current goals can be found in the care plan section) Acute Rehab OT Goals Patient Stated Goal: To go home and resume activity OT Goal Formulation: With patient/family Time For Goal Achievement: 04/02/15 Potential to Achieve Goals: Good  OT Frequency: Min 1X/week   Barriers to D/C:            Co-evaluation              End of Session Equipment Utilized During Treatment:  (hip kit)  Activity Tolerance:   Patient left: in chair;with call bell/phone within reach;with chair alarm set;with family/visitor present   Time: [id number] OT Time Calculation (min): 28 min Charges:  OT General Charges $OT Visit: 1 Procedure OT Evaluation $Initial OT Evaluation  Tier I: 1 Procedure OT Treatments $Self Care/Home Management : 8-22 mins G-Codes:    Myrene Galas, MS/OTR/L  03/19/2015, 12:22 PM

## 2015-03-20 LAB — CBC
HEMATOCRIT: 28.5 % — AB (ref 40.0–52.0)
Hemoglobin: 9.6 g/dL — ABNORMAL LOW (ref 13.0–18.0)
MCH: 31.2 pg (ref 26.0–34.0)
MCHC: 33.5 g/dL (ref 32.0–36.0)
MCV: 93 fL (ref 80.0–100.0)
PLATELETS: 138 10*3/uL — AB (ref 150–440)
RBC: 3.07 MIL/uL — ABNORMAL LOW (ref 4.40–5.90)
RDW: 12.7 % (ref 11.5–14.5)
WBC: 8.9 10*3/uL (ref 3.8–10.6)

## 2015-03-20 LAB — BASIC METABOLIC PANEL
Anion gap: 4 — ABNORMAL LOW (ref 5–15)
BUN: 16 mg/dL (ref 6–20)
CALCIUM: 8.1 mg/dL — AB (ref 8.9–10.3)
CO2: 26 mmol/L (ref 22–32)
CREATININE: 0.82 mg/dL (ref 0.61–1.24)
Chloride: 101 mmol/L (ref 101–111)
GFR calc Af Amer: >60 mL/min (ref >60)
GLUCOSE: 116 mg/dL — AB (ref 65–99)
Potassium: 4.4 mmol/L (ref 3.5–5.1)
Sodium: 131 mmol/L — ABNORMAL LOW (ref 135–145)

## 2015-03-20 LAB — SODIUM: Sodium: 129 mmol/L — ABNORMAL LOW (ref 135–145)

## 2015-03-20 NOTE — Progress Notes (Signed)
Physical Therapy Treatment Patient Details Name: Juan Mayo MRN: [id number] DOB: 1921-04-18 Today's Date: 03/20/2015    History of Present Illness Patient is a 81 y/o male that presents after a mechanical fall in his bathroom at home with subsequent R intertrochanteric fx.     PT Comments    Initial attempt; pt using urinal; nursing requested return later. Second attempt, pt agreeable to PT, but initially did not wish out of bed. Gentle encouragement and pt agreeable to up in the chair. Pt requires Mod A for bed mobility and Min A for sit to stand and short ambulation to the chair. Antalgic weight shift to R in sit and stand. Plan to see pt this afternoon for continued work on strengthening and stand activities to improve functional mobility.   Follow Up Recommendations  SNF     Equipment Recommendations  Rolling walker with 5" wheels    Recommendations for Other Services       Precautions / Restrictions Restrictions Weight Bearing Restrictions: Yes RLE Weight Bearing: Weight bearing as tolerated    Mobility  Bed Mobility Overal bed mobility: Needs Assistance Bed Mobility: Supine to Sit     Supine to sit: Mod assist     General bed mobility comments: Requires assist for LEs and trunk; increased time/effort  Transfers Overall transfer level: Needs assistance Equipment used: Rolling walker (2 wheeled) Transfers: Sit to/from Stand Sit to Stand: Min assist         General transfer comment: Cues for moving hands forward to rw with stand; increased time/effort to rise and attain upright posture  Ambulation/Gait Ambulation/Gait assistance: Min guard;Min assist Ambulation Distance (Feet): 4 Feet Assistive device: Rolling walker (2 wheeled) Gait Pattern/deviations: Step-to pattern (sidestepping bed to chair) Gait velocity: reduced Gait velocity interpretation: <1.8 ft/sec, indicative of risk for recurrent falls General Gait Details: small steps bed to chair; min  guard to min A for balance   Stairs            Wheelchair Mobility    Modified Rankin (Stroke Patients Only)       Balance Overall balance assessment: Needs assistance Sitting-balance support: Bilateral upper extremity supported Sitting balance-Leahy Scale: Fair Sitting balance - Comments: decreased weight shift to R   Standing balance support: Bilateral upper extremity supported Standing balance-Leahy Scale: Fair                      Cognition Arousal/Alertness: Awake/alert Behavior During Therapy: WFL for tasks assessed/performed Overall Cognitive Status: Within Functional Limits for tasks assessed                      Exercises      General Comments        Pertinent Vitals/Pain      Home Living                      Prior Function            PT Goals (current goals can now be found in the care plan section) Progress towards PT goals: Progressing toward goals    Frequency  BID    PT Plan Current plan remains appropriate    Co-evaluation             End of Session Equipment Utilized During Treatment: Gait belt Activity Tolerance: Patient tolerated treatment well Patient left: in chair;with call bell/phone within reach;with chair alarm set;with family/visitor present;with SCD's reapplied  Time: [id number] PT Time Calculation (min) (ACUTE ONLY): 20 min  Charges:  $Gait Training: 8-22 mins                    G Codes:      Charlaine Dalton 03/20/2015, 1:19 PM

## 2015-03-20 NOTE — Progress Notes (Signed)
Patient has been able to sit in chair at bedside, tolerating PT well. Patient has been able to control pain with PO medications. Per CSW, patient will be able to be discharged to [facility].

## 2015-03-20 NOTE — Progress Notes (Signed)
   Subjective: 2 Days Post-Op Procedure(s) (LRB): INTRAMEDULLARY (IM) NAIL INTERTROCHANTRIC (Right) Patient reports pain as mild.   Patient is well, and has had no acute complaints or problems We will continue therapy today.  No CP, SOB, abd pain   Objective: Vital signs in last 24 hours: Temp:  [97.5 F (36.4 C)-98.2 F (36.8 C)] 98.2 F (36.8 C) (12/20 0407) Pulse Rate:  [69-86] 86 (12/20 0407) Resp:  [16-18] 18 (12/20 0407) BP: (101-136)/(44-64) 136/64 mmHg (12/20 0407) SpO2:  [97 %-99 %] 99 % (12/20 0407) Weight:  [59.376 kg (130 lb 14.4 oz)] 59.376 kg (130 lb 14.4 oz) (12/20 0500)  Intake/Output from previous day: 12/19 0701 - 12/20 0700 In: 240 [P.O.:240] Out: 200 [Urine:200] Intake/Output this shift:     Recent Labs  03/18/15 0409 03/19/15 0430 03/20/15 0539  HGB 12.6* 8.9* 9.6*    Recent Labs  03/19/15 0430 03/20/15 0539  WBC 6.7 8.9  RBC 2.90* 3.07*  HCT 26.9* 28.5*  PLT 124* 138*    Recent Labs  03/19/15 0430 03/20/15 0539  NA 135 131*  K 4.4 4.4  CL 104 101  CO2 27 26  BUN 15 16  CREATININE 0.84 0.82  GLUCOSE 122* 116*  CALCIUM 7.9* 8.1*    Recent Labs  03/18/15 0409  INR 1.09    EXAM General - Patient is Alert, Appropriate and Oriented Extremity - Neurovascular intact Sensation intact distally Intact pulses distally Dorsiflexion/Plantar flexion intact Dressing - dressing C/D/I and scant drainage, new dressing applied today Motor Function - intact, moving foot and toes well on exam.   Past Medical History  Diagnosis Date  . Prostate cancer ([place])   . Hypercholesteremia   . Neuropathy ([facility])     Assessment/Plan:   2 Days Post-Op Procedure(s) (LRB): INTRAMEDULLARY (IM) NAIL INTERTROCHANTRIC (Right) Active Problems:   Hip fracture ([facility])   Acute post op blood loss anemia    Estimated body mass index is 20.19 kg/(m^2) as calculated from the following:   Height as of this encounter: 5' 7.5" (1.715 m).   Weight as of this  encounter: 59.376 kg (130 lb 14.4 oz). Advance diet Up with therapy  Needs BM Recheck labs in the am Discharge to SNF Wednesday   DVT Prophylaxis - Lovenox, Foot Pumps and TED hose Weight-Bearing as tolerated to right leg D/C O2 and Pulse OX and try on Room Air  T. Rachelle Hora, PA-C [place] 03/20/2015, 7:12 AM

## 2015-03-20 NOTE — Progress Notes (Signed)
Physical Therapy Treatment Patient Details Name: Juan Mayo MRN: [id number] DOB: December 09, 1921 Today's Date: 03/20/2015    History of Present Illness Patient is a 81 y/o male that presents after a mechanical fall in his bathroom at home with subsequent R intertrochanteric fx.     PT Comments    Pt demonstrating improved distance with ambulation. Quality progressing; however, needs reinforcement/reminder cues initially with each walk. Sit to stand transfers continue to require cueing for hand placement. Pt takes increased time for all transfers and bed mobility at the present, but show great effort. Pain an weakness limiting pt at this time, which frustrates patient, as he was very independent before his fall. Pt required seated rest before making it back to his room during ambulation; pt complains of overall fatigue and is not quite sure if it is related to pain or dizziness/fatigue. Pt rested 8 minutes with cool cloth provided for head, face and neck. Pt reports feeling better with rest. Pt able to finish walk to bed. Vitals assessed when returned to bed and BP noted to be 131/51. Continue PT for progression of strength and endurance to improve safety and technique with all functional mobility.   Follow Up Recommendations  SNF     Equipment Recommendations  Rolling walker with 5" wheels    Recommendations for Other Services       Precautions / Restrictions Precautions Precautions: Fall Restrictions Weight Bearing Restrictions: Yes RLE Weight Bearing: Weight bearing as tolerated    Mobility  Bed Mobility Overal bed mobility: Needs Assistance Bed Mobility: Sit to Supine     Supine to sit: Mod assist Sit to supine: Min assist (for LEs)   General bed mobility comments: Requires use of trapeze and Min A for rolling and scooting upward in bed to reposition  Transfers Overall transfer level: Needs assistance Equipment used: Rolling walker (2 wheeled) Transfers: Sit to/from  Stand Sit to Stand: Mod assist (from recliner and visitor chair)         General transfer comment: Cues for moving hands forward to rw with stand; increased time/effort to rise and attain upright posture  Ambulation/Gait Ambulation/Gait assistance: Min guard Ambulation Distance (Feet): 60 Feet (second walk 30 ft after 8 minute rest) Assistive device: Rolling walker (2 wheeled) Gait Pattern/deviations: Step-to pattern Gait velocity: reduced Gait velocity interpretation: <1.8 ft/sec, indicative of risk for recurrent falls General Gait Details: initially steps too close into rw, but improved with cueing   Stairs            Wheelchair Mobility    Modified Rankin (Stroke Patients Only)       Balance Overall balance assessment: Needs assistance Sitting-balance support: Bilateral upper extremity supported Sitting balance-Leahy Scale: Fair Sitting balance - Comments: decreased weight shift to R   Standing balance support: Bilateral upper extremity supported Standing balance-Leahy Scale: Fair                      Cognition Arousal/Alertness: Awake/alert Behavior During Therapy: WFL for tasks assessed/performed Overall Cognitive Status: Within Functional Limits for tasks assessed                      Exercises      General Comments        Pertinent Vitals/Pain Pain Assessment: 0-10 Pain Score: 5  (with ambulation) Pain Location: R anterior hip/thigh Pain Descriptors / Indicators: Aching;Sharp Pain Intervention(s): Monitored during session;Repositioned    Home Living  Prior Function            PT Goals (current goals can now be found in the care plan section) Progress towards PT goals: Progressing toward goals    Frequency  BID    PT Plan Current plan remains appropriate    Co-evaluation             End of Session Equipment Utilized During Treatment: Gait belt Activity Tolerance: Patient limited by  fatigue;Patient limited by pain (felt fatigued "all over" requiring rest between walks) Patient left: in bed;with call bell/phone within reach;with bed alarm set;with SCD's reapplied     Time: [id number] PT Time Calculation (min) (ACUTE ONLY): 41 min  Charges:  $Gait Training: 23-37 mins $Therapeutic Exercise: 8-22 mins                    G Codes:      Charlaine Dalton 03/20/2015, 4:01 PM

## 2015-03-20 NOTE — Clinical Social Work Placement (Signed)
   CLINICAL SOCIAL WORK PLACEMENT  NOTE  Date:  03/20/2015  Patient Details  Name: Juan Mayo MRN: [id number] Date of Birth: 26-Aug-1921  Clinical Social Work is seeking post-discharge placement for this patient at the [place] level of care (*CSW will initial, date and re-position this form in  chart as items are completed):  Yes   Patient/family provided with [place] Work Department's list of facilities offering this level of care within the geographic area requested by the patient (or if unable, by the patient's family).  Yes   Patient/family informed of their freedom to choose among providers that offer the needed level of care, that participate in Medicare, Medicaid or managed care program needed by the patient, have an available bed and are willing to accept the patient.  Yes   Patient/family informed of 's ownership interest in [facility] and [facility], as well as of the fact that they are under no obligation to receive care at these facilities.  PASRR submitted to EDS on       PASRR number received on       Existing PASRR number confirmed on 03/20/15     FL2 transmitted to all facilities in geographic area requested by pt/family on 03/19/15     FL2 transmitted to all facilities within larger geographic area on       Patient informed that his/her managed care company has contracts with or will negotiate with certain facilities, including the following:        Yes   Patient/family informed of bed offers received.  Patient chooses bed at [facility]     Physician recommends and patient chooses bed at      Patient to be transferred to [facility] on 03/21/15.  Patient to be transferred to facility by       Patient family notified on   of transfer.  Name of family member notified:        PHYSICIAN       Additional Comment:    _______________________________________________ Alonna Buckler,  LCSW 03/20/2015, 3:50 PM

## 2015-03-20 NOTE — Progress Notes (Signed)
Pt has confirmed SNF bed at [facility] on Wednesday if medically cleared for dc. Pt pleased with plan, he will update his sister [name]. CSW updated RN with plan.   Toma Copier, [place]

## 2015-03-20 NOTE — Discharge Instructions (Signed)
Diet: As you were doing prior to hospitalization   Shower:  May shower but keep the wounds dry, use an occlusive plastic wrap, NO SOAKING IN TUB.  If the bandage gets wet, change with a clean dry gauze.  Dressing:  You may change your dressing as needed. Change the dressing with sterile gauze dressing.  Please remove staples and apply steri strips on 04/01/15  Activity:  Increase activity slowly as tolerated, but follow the weight bearing instructions below.  No lifting or driving for 6 weeks.  Weight Bearing:   Weight bearing as tolerated to right lower extremity  To prevent constipation: you may use a stool softener such as -  Colace (over the counter) 100 mg by mouth twice a day  Drink plenty of fluids (prune juice may be helpful) and high fiber foods Miralax (over the counter) for constipation as needed.    Itching:  If you experience itching with your medications, try taking only a single pain pill, or even half a pain pill at a time.  You may take up to 10 pain pills per day, and you can also use benadryl over the counter for itching or also to help with sleep.   Precautions:  If you experience chest pain or shortness of breath - call 911 immediately for transfer to the hospital emergency department!!  If you develop a fever greater that 101 F, purulent drainage from wound, increased redness or drainage from wound, or calf pain-Call [place]                                              Follow- Up Appointment:  Please call for an appointment to be seen in 6  weeks at [facility]. Please remove staples and apply steri strips on 04/01/15.

## 2015-03-20 NOTE — Progress Notes (Signed)
[place] at [place] NAME: Juan Mayo    MR#:  [id number]  DATE OF BIRTH:  07/20/1921  SUBJECTIVE:  CHIEF COMPLAINT:   Chief Complaint  Patient presents with  . Fall  . Hip Pain   - Right hip intertrochanteric fracture, status post surgery, postoperative day 2 today. -Post surgery acute anemia noted, that is improving, patient hasn't required any blood transfusion. -Making good progress with physical therapy. Possible discharge to rehabilitation tomorrow  REVIEW OF SYSTEMS:  Review of Systems  Constitutional: Negative for fever and chills.  HENT: Negative for ear discharge, ear pain and nosebleeds.   Eyes: Negative for blurred vision.  Respiratory: Negative for cough, shortness of breath and wheezing.   Cardiovascular: Negative for chest pain and palpitations.  Gastrointestinal: Negative for nausea, vomiting, abdominal pain, diarrhea and constipation.  Genitourinary: Negative for dysuria.  Musculoskeletal: Positive for joint pain. Negative for myalgias.       Right hip pain  Neurological: Negative for dizziness, sensory change, speech change, focal weakness, seizures, weakness and headaches.  Endo/Heme/Allergies: Does not bruise/bleed easily.  Psychiatric/Behavioral: Negative for depression.    DRUG ALLERGIES:  No Known Allergies  VITALS:  Blood pressure 115/51, pulse 91, temperature 98.7 F (37.1 C), temperature source Oral, resp. rate 18, height 5' 7.5" (1.715 m), weight 59.376 kg (130 lb 14.4 oz), SpO2 100 %.  PHYSICAL EXAMINATION:  Physical Exam  GENERAL:  81 y.o.-year-old patient lying in the bed with no acute distress.  EYES: Pupils equal, round, reactive to light and accommodation. No scleral icterus. Extraocular muscles intact.  HEENT: Head atraumatic, normocephalic. Oropharynx and nasopharynx clear.  NECK:  Supple, no jugular venous distention. No thyroid enlargement, no tenderness.  LUNGS: Normal breath sounds  bilaterally, no wheezing, rales,rhonchi or crepitation. No use of accessory muscles of respiration. Decreased bibasilar breath sounds. CARDIOVASCULAR: S1, S2 normal. No rubs, or gallops. Loud 3/6 systolic murmur in aortic area. ABDOMEN: Soft, nontender, nondistended. Bowel sounds present. No organomegaly or mass.  EXTREMITIES: No pedal edema, cyanosis, or clubbing.  NEUROLOGIC: Cranial nerves II through XII are intact. Muscle strength 5/5 in all extremities. Sensation intact. Gait not checked.  PSYCHIATRIC: The patient is alert and oriented x 3.  SKIN: No obvious rash, lesion, or ulcer.    LABORATORY PANEL:   CBC  Recent Labs Lab 03/20/15 0539  WBC 8.9  HGB 9.6*  HCT 28.5*  PLT 138*   ------------------------------------------------------------------------------------------------------------------  Chemistries   Recent Labs Lab 03/18/15 0409  03/20/15 0539  NA 137  < > 131*  K 4.4  < > 4.4  CL 101  < > 101  CO2 31  < > 26  GLUCOSE 123*  < > 116*  BUN 19  < > 16  CREATININE 0.91  < > 0.82  CALCIUM 8.9  < > 8.1*  AST 20  --   --   ALT 15*  --   --   ALKPHOS 91  --   --   BILITOT 0.6  --   --   < > = values in this interval not displayed. ------------------------------------------------------------------------------------------------------------------  Cardiac Enzymes No results for input(s): TROPONINI in the last 168 hours. ------------------------------------------------------------------------------------------------------------------  RADIOLOGY:  Dg Hip Operative Unilat With Pelvis Right  03/18/2015  CLINICAL DATA:  Operative imaging during right proximal femur fracture ORIF. EXAM: OPERATIVE RIGHT HIP (WITH PELVIS IF PERFORMED) 3 VIEWS TECHNIQUE: Fluoroscopic spot image(s) were submitted for interpretation post-operatively. COMPARISON:  03/18/2015 at 4:34 a.m.  FINDINGS: The intertrochanteric fracture of the right proximal femur has been reduced with a compression  screw and intra medullary rod. The major fracture components are in near anatomic alignment. Orthopedic hardware is well-seated. There is no new fracture or evidence of an operative complication. IMPRESSION: Well aligned major fracture fragments following ORIF of a proximal right femur fracture. Electronically Signed   By: Lajean Manes M.D.   On: 03/18/2015 17:28    EKG:   Orders placed or performed during the hospital encounter of 03/18/15  . ED EKG  . ED EKG  . EKG 12-Lead  . EKG 12-Lead  . EKG 12-Lead  . EKG 12-Lead  . EKG 12-Lead  . EKG 12-Lead    ASSESSMENT AND PLAN:   81 year old male with past medical history significant for CAD status post CABG, aortic valve replacement surgery with bioprosthetic valve, peripheral neuropathy who is very active at baseline, presents to the hospital secondary to fall and right intertrochanteric fracture.  #1 Fall and right hip fracture- POD #2 today -With his history of bypass surgery and valve replacement surgery-but stable. -Patient denies any chest pain, no arrhythmias. - Pain control. Physical therapy -rehab at discharge in 1 day - will need a bowel movement today  #2 CAD status post CABG-continue metoprolol. -Aspirin started after surgery. Also on metoprolol -Appreciate cardiology consult  #3 neuropathy-continue Neurontin  #4 Acute post op anemia on chronic anemia- globin dropped after surgery, but stable at 9.6. -Patient does not need any transfusion. - Monitor at this time.   #5 DVT Prophylaxis- Lovenox   All the records are reviewed and case discussed with Care Management/Social Workerr. Management plans discussed with the patient, family and they are in agreement.  CODE STATUS: Full Code  TOTAL TIME TAKING CARE OF THIS PATIENT: 36 minutes.   POSSIBLE D/C TOMORROW, DEPENDING ON CLINICAL CONDITION.   Gladstone Lighter M.D on 03/20/2015 at 1:22 PM  Between 7am to 6pm - Pager - [phone]  After 6pm go to  [email or web address] - password [email or web address] [facility]  [place] [facility]  Office  [phone]  CC: Primary care physician; No primary care provider on file.

## 2015-03-21 ENCOUNTER — Encounter
Admission: RE | Admit: 2015-03-21 | Discharge: 2015-03-21 | Disposition: A | Discharge status: Home / Self Care | Payer: PPO | Source: Ambulatory Visit | Attending: Internal Medicine | Admitting: Internal Medicine

## 2015-03-21 DIAGNOSIS — E871 Hypo-osmolality and hyponatremia: Secondary | ICD-10-CM | POA: Insufficient documentation

## 2015-03-21 LAB — SODIUM: SODIUM: 132 mmol/L — AB (ref 135–145)

## 2015-03-21 MED ORDER — FLEET ENEMA 7-19 GM/118ML RE ENEM: 1.0000 | ENEMA | Freq: Once | RECTAL | Status: DC

## 2015-03-21 MED ORDER — DOCUSATE SODIUM 100 MG PO CAPS: 100.0000 mg | ORAL_CAPSULE | Freq: Two times a day (BID) | ORAL | Status: DC

## 2015-03-21 MED ORDER — OXYCODONE HCL 5 MG PO TABS: 5.0000 mg | ORAL_TABLET | ORAL | Status: DC | PRN

## 2015-03-21 MED ORDER — SODIUM CHLORIDE 0.9 % IV SOLN
INTRAVENOUS | Status: DC
  Administered 2015-03-21: 08:00:00 via INTRAVENOUS

## 2015-03-21 MED ORDER — ENOXAPARIN SODIUM 40 MG/0.4ML ~~LOC~~ SOLN: 40.0000 mg | SUBCUTANEOUS | Status: DC

## 2015-03-21 NOTE — Progress Notes (Addendum)
Physical Therapy Treatment Patient Details Name: Juan Mayo MRN: [id number] DOB: 05/19/1921 Today's Date: 03/21/2015    History of Present Illness Patient is a 81 y/o male that presents after a mechanical fall in his bathroom at home with subsequent R intertrochanteric fx.     PT Comments    Pt complains of increased pain today after right leg being lowered to the floor too fast last night. 8/10 pain in R hip/thigh. Pt prepared for discharge to [facility] today; due to pain and being ready for discharge, session consisted of bed exercises. Pt tolerated well without increasing pain. Pt frustrated by "setback"; however, encouraged to perform exercises with gentle movement as tolerated, as to not aggravate R hip further. Pt understands. Continue strengthening, transfers, ambulation and balance training at rehab.     Follow Up Recommendations  SNF     Equipment Recommendations  Rolling walker with 5" wheels    Recommendations for Other Services       Precautions / Restrictions Precautions Precautions: Fall Restrictions Weight Bearing Restrictions: Yes RLE Weight Bearing: Weight bearing as tolerated    Mobility  Bed Mobility               General bed mobility comments: Not tested; pt too sore and in bed prepared for transfer  Transfers                    Ambulation/Gait                 Stairs            Wheelchair Mobility    Modified Rankin (Stroke Patients Only)       Balance                                    Cognition Arousal/Alertness: Awake/alert Behavior During Therapy: WFL for tasks assessed/performed Overall Cognitive Status: Within Functional Limits for tasks assessed                      Exercises General Exercises - Lower Extremity Ankle Circles/Pumps: AROM;Both;20 reps;Supine Quad Sets: Strengthening;Both;20 reps;Supine Gluteal Sets: Strengthening;Both;20 reps;Supine Short Arc Quad: AROM;20  reps;Supine;Right (RROM on R) Heel Slides: AAROM;Right;20 reps;Supine (AROM with resisted extension L) Hip ABduction/ADduction: AAROM;Right;20 reps;Supine (RROM L) Straight Leg Raises: AAROM;Right;10 reps;Supine (AROM L) Other Exercises Other Exercises: RROM ham curl on L 20x    General Comments        Pertinent Vitals/Pain Pain Assessment: 0-10 Pain Score: 8  Pain Location: R hip/LE (notes RLE was lowered too quickly last night increasing pain) Pain Descriptors / Indicators: Constant;Burning;Sore Pain Intervention(s): Limited activity within patient's tolerance;Monitored during session;RN gave pain meds during session    Home Living                      Prior Function            PT Goals (current goals can now be found in the care plan section) Progress towards PT goals: Progressing toward goals    Frequency  BID    PT Plan Current plan remains appropriate    Co-evaluation             End of Session   Activity Tolerance: Patient tolerated treatment well;No increased pain (gentle motion on R) Patient left: in bed;with call bell/phone within reach;with bed alarm set;with SCD's reapplied  Time: 0934-1000 PT Time Calculation (min) (ACUTE ONLY): 26 min  Charges:  $Therapeutic Exercise: 23-37 mins                    G Codes:      Charlaine Dalton 03/21/2015, 10:11 AM

## 2015-03-21 NOTE — Care Management Important Message (Signed)
Important Message  Patient Details  Name: BROUGHTON SILOS MRN: [id number] Date of Birth: 1921-11-20   Medicare Important Message Given:  Yes    Juliann Pulse A Luretha Eberly 03/21/2015, 10:00 AM

## 2015-03-21 NOTE — Clinical Social Work Placement (Signed)
   CLINICAL SOCIAL WORK PLACEMENT  NOTE  Date:  03/21/2015  Patient Details  Name: Juan Mayo MRN: [id number] Date of Birth: March 10, 1922  Clinical Social Work is seeking post-discharge placement for this patient at the [place] level of care (*CSW will initial, date and re-position this form in  chart as items are completed):  Yes   Patient/family provided with [place] Work Department's list of facilities offering this level of care within the geographic area requested by the patient (or if unable, by the patient's family).  Yes   Patient/family informed of their freedom to choose among providers that offer the needed level of care, that participate in Medicare, Medicaid or managed care program needed by the patient, have an available bed and are willing to accept the patient.  Yes   Patient/family informed of [place]'s ownership interest in [facility] and [facility], as well as of the fact that they are under no obligation to receive care at these facilities.  PASRR submitted to EDS on       PASRR number received on       Existing PASRR number confirmed on 03/20/15     FL2 transmitted to all facilities in geographic area requested by pt/family on 03/19/15     FL2 transmitted to all facilities within larger geographic area on       Patient informed that his/her managed care company has contracts with or will negotiate with certain facilities, including the following:        Yes   Patient/family informed of bed offers received.  Patient chooses bed at [facility]     Physician recommends and patient chooses bed at      Patient to be transferred to [facility] on 03/21/15.  Patient to be transferred to facility by EMS     Patient family notified on 03/21/15 of transfer.  Name of family member notified:  Left message with [name] -sister     PHYSICIAN       Additional Comment:     _______________________________________________ Alonna Buckler, LCSW 03/21/2015, 10:57 AM

## 2015-03-21 NOTE — Discharge Summary (Signed)
[place] at [place] NAME: Juan Mayo    MR#:  [id number]  DATE OF BIRTH:  1921-06-17  DATE OF ADMISSION:  03/18/2015 ADMITTING PHYSICIAN: [name], MD  DATE OF DISCHARGE: 03/21/15  PRIMARY CARE PHYSICIAN: No primary care provider on file.    ADMISSION DIAGNOSIS:  Fracture, intertrochanteric, right femur, closed, initial encounter ([place]) [S72.141A]  DISCHARGE DIAGNOSIS:  Active Problems:   Hip fracture ([place])   SECONDARY DIAGNOSIS:   Past Medical History  Diagnosis Date  . Prostate cancer ([place])   . Hypercholesteremia   . Neuropathy [facility])     HOSPITAL COURSE:   81 year old male with past medical history significant for CAD status post CABG, aortic valve replacement surgery with bioprosthetic valve, peripheral neuropathy who is very active at baseline, presents to the hospital secondary to fall and right intertrochanteric fracture.  #1 Fall and right hip fracture- POD # 3 today -With his history of bypass surgery and valve replacement surgery-but stable. -Patient denies any chest pain, no arrhythmias. - Pain control. Physical therapy -to rehab today after a bowel movement  #2 CAD status post CABG-continue metoprolol. -Aspirin started after surgery. Also on metoprolol -Appreciate cardiology consult  #3 neuropathy-continue Neurontin  #4 Acute post op anemia on chronic anemia- hemoglobin dropped after surgery, but stable at 9.6. -Patient does not need any transfusion. - Monitor at this time.   #5 hyponatremia- hypovolemic after surgery, patient not symptomatic. Will give some NS - should be fine for discharge. Check BMP in 3 days at rehab  Discharge to rehab today  DISCHARGE CONDITIONS:   Stable  CONSULTS OBTAINED:  Treatment Team:  [name], MD Corey Skains, MD  DRUG ALLERGIES:  No Known Allergies  DISCHARGE MEDICATIONS:   Current Discharge Medication List    START taking these medications    Details  docusate sodium (COLACE) 100 MG capsule Take 1 capsule (100 mg total) by mouth 2 (two) times daily. Qty: 10 capsule, Refills: 0    enoxaparin (LOVENOX) 40 MG/0.4ML injection Inject 0.4 mLs (40 mg total) into the skin daily. Qty: 5 mL, Refills: 0    oxyCODONE (ROXICODONE) 5 MG immediate release tablet Take 1-2 tablets (5-10 mg total) by mouth every 4 (four) hours as needed for moderate pain or severe pain. Qty: 30 tablet, Refills: 0      CONTINUE these medications which have NOT CHANGED   Details  aspirin 81 MG tablet Take 81 mg by mouth daily.    gabapentin (NEURONTIN) 300 MG capsule Take 300 mg by mouth daily.    metoprolol tartrate (LOPRESSOR) 25 MG tablet Take 25 mg by mouth 2 (two) times daily.         DISCHARGE INSTRUCTIONS:   1. Orthopedics follow up in 3-4 weeks 2. PCP f/u in 2 weeks 3. Physical Therapy  If you experience worsening of your admission symptoms, develop shortness of breath, life threatening emergency, suicidal or homicidal thoughts you must seek medical attention immediately by calling 911 or calling your MD immediately  if symptoms less severe.  You Must read complete instructions/literature along with all the possible adverse reactions/side effects for all the Medicines you take and that have been prescribed to you. Take any new Medicines after you have completely understood and accept all the possible adverse reactions/side effects.   Please note  You were cared for by a hospitalist during your hospital stay. If you have any questions about your discharge medications or the care you  received while you were in the hospital after you are discharged, you can call the unit and asked to speak with the hospitalist on call if the hospitalist that took care of you is not available. Once you are discharged, your primary care physician will handle any further medical issues. Please note that NO REFILLS for any discharge medications will be authorized once  you are discharged, as it is imperative that you return to your primary care physician (or establish a relationship with a primary care physician if you do not have one) for your aftercare needs so that they can reassess your need for medications and monitor your lab values.    Today   CHIEF COMPLAINT:   Chief Complaint  Patient presents with  . Fall  . Hip Pain    VITAL SIGNS:  Blood pressure 125/49, pulse 76, temperature 98.2 F (36.8 C), temperature source Oral, resp. rate 18, height 5' 7.5" (1.715 m), weight 64.139 kg (141 lb 6.4 oz), SpO2 99 %.  I/O:   Intake/Output Summary (Last 24 hours) at 03/21/15 0850 Last data filed at 03/21/15 0800  Gross per 24 hour  Intake      3 ml  Output    575 ml  Net   -572 ml    PHYSICAL EXAMINATION:   Physical Exam  GENERAL: 81 y.o.-year-old patient lying in the bed with no acute distress.  EYES: Pupils equal, round, reactive to light and accommodation. No scleral icterus. Extraocular muscles intact.  HEENT: Head atraumatic, normocephalic. Oropharynx and nasopharynx clear.  NECK: Supple, no jugular venous distention. No thyroid enlargement, no tenderness.  LUNGS: Normal breath sounds bilaterally, no wheezing, rales,rhonchi or crepitation. No use of accessory muscles of respiration. Decreased bibasilar breath sounds. CARDIOVASCULAR: S1, S2 normal. No rubs, or gallops. Loud 3/6 systolic murmur in aortic area. ABDOMEN: Soft, nontender, nondistended. Bowel sounds present. No organomegaly or mass.  EXTREMITIES: No pedal edema, cyanosis, or clubbing.  NEUROLOGIC: Cranial nerves II through XII are intact. Muscle strength 5/5 in all extremities.improved mobility of right leg. Sensation intact. Gait not checked.  PSYCHIATRIC: The patient is alert and oriented x 3.  SKIN: No obvious rash, lesion, or ulcer.   DATA REVIEW:   CBC  Recent Labs Lab 03/20/15 0539  WBC 8.9  HGB 9.6*  HCT 28.5*  PLT 138*    Chemistries   Recent  Labs Lab 03/18/15 0409  03/20/15 0539 03/20/15 2003  NA 137  < > 131* 129*  K 4.4  < > 4.4  --   CL 101  < > 101  --   CO2 31  < > 26  --   GLUCOSE 123*  < > 116*  --   BUN 19  < > 16  --   CREATININE 0.91  < > 0.82  --   CALCIUM 8.9  < > 8.1*  --   AST 20  --   --   --   ALT 15*  --   --   --   ALKPHOS 91  --   --   --   BILITOT 0.6  --   --   --   < > = values in this interval not displayed.  Cardiac Enzymes No results for input(s): TROPONINI in the last 168 hours.  Microbiology Results  Results for orders placed or performed during the hospital encounter of 03/18/15  Surgical pcr screen     Status: None   Collection Time: 03/18/15 12:51 PM  Result  Value Ref Range Status   MRSA, PCR NEGATIVE NEGATIVE Final   Staphylococcus aureus NEGATIVE NEGATIVE Final    Comment:        The Xpert SA Assay (FDA approved for NASAL specimens in patients over 78 years of age), is one component of a comprehensive surveillance program.  Test performance has been validated by [facility] for patients greater than or equal to 70 year old. It is not intended to diagnose infection nor to guide or monitor treatment.     RADIOLOGY:  No results found.  EKG:   Orders placed or performed during the hospital encounter of 03/18/15  . ED EKG  . ED EKG  . EKG 12-Lead  . EKG 12-Lead  . EKG 12-Lead  . EKG 12-Lead  . EKG 12-Lead  . EKG 12-Lead      Management plans discussed with the patient, family and they are in agreement.  CODE STATUS:     Code Status Orders        Start     Ordered   03/18/15 1759  Full code   Continuous     03/18/15 1758    Advance Directive Documentation        Most Recent Value   Type of Advance Directive  Living will   Pre-existing out of facility DNR order (yellow form or pink MOST form)     "MOST" Form in Place?        TOTAL TIME TAKING CARE OF THIS PATIENT: 37 minutes.    Gladstone Lighter M.D on 03/21/2015 at 8:50 AM  Between 7am to  6pm - Pager - [phone]  After 6pm go to [email or web address] - password [email or web address] [facility]  [place] [facility]  Office  [phone]  CC: Primary care physician; No primary care provider on file.

## 2015-03-21 NOTE — Progress Notes (Signed)
Pt able to sit up at bed with minimal assistance. Patient tolerated PT well and cooperative with nursing staff. Pain is controlled with PO meds. Patient had BM this morning.

## 2015-03-21 NOTE — Progress Notes (Signed)
CSW was notified by MD that Pt has been medically cleared for dc to SNF [facility]. CSW sent dc info to SNF. RN to call EMS and report to facility RN. CSW updated Pt for plan this morning and left message with Juan Mayo is sister. Pt is in touch with other family members as well.   Juan Mayo, [place]

## 2015-03-21 NOTE — Progress Notes (Signed)
Called to arrange transport via [facility]. Called report to [facility], gave report to [place], [place].

## 2015-03-21 NOTE — Progress Notes (Signed)
   Subjective: 3 Days Post-Op Procedure(s) (LRB): INTRAMEDULLARY (IM) NAIL INTERTROCHANTRIC (Right) Patient reports pain as mild.   Patient is well, and has had no acute complaints or problems We will continue therapy today.  No CP, SOB, abd pain. Needs BM, passing gas.   Objective: Vital signs in last 24 hours: Temp:  [97.5 F (36.4 C)-98.7 F (37.1 C)] 98.2 F (36.8 C) (12/21 0414) Pulse Rate:  [64-121] 64 (12/21 0417) Resp:  [18] 18 (12/21 0414) BP: (104-133)/(34-55) 104/52 mmHg (12/21 0417) SpO2:  [95 %-100 %] 95 % (12/21 0414) Weight:  [64.139 kg (141 lb 6.4 oz)] 64.139 kg (141 lb 6.4 oz) (12/21 0415)  Intake/Output from previous day: 12/20 0701 - 12/21 0700 In: -  Out: 575 [Urine:575] Intake/Output this shift:     Recent Labs  03/19/15 0430 03/20/15 0539  HGB 8.9* 9.6*    Recent Labs  03/19/15 0430 03/20/15 0539  WBC 6.7 8.9  RBC 2.90* 3.07*  HCT 26.9* 28.5*  PLT 124* 138*    Recent Labs  03/19/15 0430 03/20/15 0539 03/20/15 2003  NA 135 131* 129*  K 4.4 4.4  --   CL 104 101  --   CO2 27 26  --   BUN 15 16  --   CREATININE 0.84 0.82  --   GLUCOSE 122* 116*  --   CALCIUM 7.9* 8.1*  --    No results for input(s): LABPT, INR in the last 72 hours.  EXAM General - Patient is Alert, Appropriate and Oriented Extremity - Neurovascular intact Sensation intact distally Intact pulses distally Dorsiflexion/Plantar flexion intact Dressing - dressing C/D/I and scant drainage Motor Function - intact, moving foot and toes well on exam.   Past Medical History  Diagnosis Date  . Prostate cancer ([place])   . Hypercholesteremia   . Neuropathy ([facility])     Assessment/Plan:   3 Days Post-Op Procedure(s) (LRB): INTRAMEDULLARY (IM) NAIL INTERTROCHANTRIC (Right) Active Problems:   Hip fracture ([facility])   Acute post op blood loss anemia    Estimated body mass index is 21.81 kg/(m^2) as calculated from the following:   Height as of this encounter: 5' 7.5"  (1.715 m).   Weight as of this encounter: 64.139 kg (141 lb 6.4 oz). Advance diet Up with therapy  Needs BM Discharge to SNF pending medical clearance Will need to follow up with [place] [facility] in 6 weeks Remove staples, apply steri strips on 04/01/15   DVT Prophylaxis - Lovenox, Foot Pumps and TED hose Weight-Bearing as tolerated to right leg D/C O2 and Pulse OX and try on Room Air  T. Rachelle Hora, PA-C [place] 03/21/2015, 7:16 AM

## 2015-03-22 ENCOUNTER — Other Ambulatory Visit
Admission: RE | Admit: 2015-03-22 | Discharge: 2015-03-22 | Disposition: A | Discharge status: Home / Self Care | Payer: PPO | Source: Ambulatory Visit | Attending: Internal Medicine | Admitting: Internal Medicine

## 2015-03-22 DIAGNOSIS — R5383 Other fatigue: Secondary | ICD-10-CM | POA: Insufficient documentation

## 2015-03-22 DIAGNOSIS — D649 Anemia, unspecified: Secondary | ICD-10-CM | POA: Diagnosis present

## 2015-03-22 DIAGNOSIS — E871 Hypo-osmolality and hyponatremia: Secondary | ICD-10-CM | POA: Insufficient documentation

## 2015-03-22 LAB — COMPREHENSIVE METABOLIC PANEL
ALT: 16 U/L — AB (ref 17–63)
AST: 18 U/L (ref 15–41)
Albumin: 2.9 g/dL — ABNORMAL LOW (ref 3.5–5.0)
Alkaline Phosphatase: 83 U/L (ref 38–126)
Anion gap: 5 (ref 5–15)
BILIRUBIN TOTAL: 1.1 mg/dL (ref 0.3–1.2)
BUN: 14 mg/dL (ref 6–20)
CO2: 28 mmol/L (ref 22–32)
CREATININE: 0.85 mg/dL (ref 0.61–1.24)
Calcium: 7.8 mg/dL — ABNORMAL LOW (ref 8.9–10.3)
Chloride: 96 mmol/L — ABNORMAL LOW (ref 101–111)
GFR calc Af Amer: >60 mL/min (ref >60)
Glucose, Bld: 102 mg/dL — ABNORMAL HIGH (ref 65–99)
POTASSIUM: 4 mmol/L (ref 3.5–5.1)
Sodium: 129 mmol/L — ABNORMAL LOW (ref 135–145)
TOTAL PROTEIN: 5.7 g/dL — AB (ref 6.5–8.1)

## 2015-03-22 LAB — CBC WITH DIFFERENTIAL/PLATELET
Basophils Absolute: 0 10*3/uL (ref 0–0.1)
Basophils Relative: 1 %
Eosinophils Absolute: 0.1 10*3/uL (ref 0–0.7)
Eosinophils Relative: 1 %
HCT: 25.3 % — ABNORMAL LOW (ref 40.0–52.0)
Hemoglobin: 8.6 g/dL — ABNORMAL LOW (ref 13.0–18.0)
LYMPHS ABS: 0.3 10*3/uL — AB (ref 1.0–3.6)
LYMPHS PCT: 4 %
MCH: 31.1 pg (ref 26.0–34.0)
MCHC: 33.9 g/dL (ref 32.0–36.0)
MCV: 91.7 fL (ref 80.0–100.0)
Monocytes Absolute: 0.5 10*3/uL (ref 0.2–1.0)
Monocytes Relative: 8 %
NEUTROS ABS: 5.5 10*3/uL (ref 1.4–6.5)
NEUTROS PCT: 86 %
PLATELETS: 165 10*3/uL (ref 150–440)
RBC: 2.76 MIL/uL — AB (ref 4.40–5.90)
RDW: 12.5 % (ref 11.5–14.5)
WBC: 6.4 10*3/uL (ref 3.8–10.6)

## 2015-03-22 LAB — MAGNESIUM: Magnesium: 2 mg/dL (ref 1.7–2.4)

## 2015-03-22 LAB — TSH: TSH: 5.361 u[IU]/mL — AB (ref 0.350–4.500)

## 2015-03-23 LAB — VITAMIN D 25 HYDROXY (VIT D DEFICIENCY, FRACTURES): VIT D 25 HYDROXY: 37.8 ng/mL (ref 30.0–100.0)

## 2015-03-23 LAB — VITAMIN B12: Vitamin B-12: 280 pg/mL (ref 180–914)

## 2015-03-24 DIAGNOSIS — E871 Hypo-osmolality and hyponatremia: Secondary | ICD-10-CM | POA: Diagnosis not present

## 2015-03-24 LAB — BASIC METABOLIC PANEL
ANION GAP: 5 (ref 5–15)
BUN: 13 mg/dL (ref 6–20)
CALCIUM: 7.8 mg/dL — AB (ref 8.9–10.3)
CHLORIDE: 98 mmol/L — AB (ref 101–111)
CO2: 30 mmol/L (ref 22–32)
CREATININE: 0.63 mg/dL (ref 0.61–1.24)
GFR calc non Af Amer: >60 mL/min (ref >60)
Glucose, Bld: 92 mg/dL (ref 65–99)
Potassium: 4.3 mmol/L (ref 3.5–5.1)
SODIUM: 133 mmol/L — AB (ref 135–145)

## 2015-04-01 DIAGNOSIS — S72141D Displaced intertrochanteric fracture of right femur, subsequent encounter for closed fracture with routine healing: Secondary | ICD-10-CM | POA: Diagnosis not present

## 2015-04-01 DIAGNOSIS — Z9181 History of falling: Secondary | ICD-10-CM | POA: Diagnosis not present

## 2015-04-01 DIAGNOSIS — E7800 Pure hypercholesterolemia, unspecified: Secondary | ICD-10-CM | POA: Diagnosis not present

## 2015-04-01 DIAGNOSIS — E538 Deficiency of other specified B group vitamins: Secondary | ICD-10-CM | POA: Diagnosis not present

## 2015-04-01 DIAGNOSIS — I350 Nonrheumatic aortic (valve) stenosis: Secondary | ICD-10-CM | POA: Diagnosis not present

## 2015-04-01 DIAGNOSIS — M6281 Muscle weakness (generalized): Secondary | ICD-10-CM | POA: Diagnosis not present

## 2015-04-01 DIAGNOSIS — I2510 Atherosclerotic heart disease of native coronary artery without angina pectoris: Secondary | ICD-10-CM | POA: Diagnosis not present

## 2015-04-01 DIAGNOSIS — R2689 Other abnormalities of gait and mobility: Secondary | ICD-10-CM | POA: Diagnosis not present

## 2015-04-01 DIAGNOSIS — Z953 Presence of xenogenic heart valve: Secondary | ICD-10-CM | POA: Diagnosis not present

## 2015-04-01 DIAGNOSIS — G629 Polyneuropathy, unspecified: Secondary | ICD-10-CM | POA: Diagnosis not present

## 2015-04-01 DIAGNOSIS — E639 Nutritional deficiency, unspecified: Secondary | ICD-10-CM | POA: Diagnosis not present

## 2015-04-01 DIAGNOSIS — E871 Hypo-osmolality and hyponatremia: Secondary | ICD-10-CM | POA: Diagnosis not present

## 2015-04-01 DIAGNOSIS — Z951 Presence of aortocoronary bypass graft: Secondary | ICD-10-CM | POA: Diagnosis not present

## 2015-04-01 DIAGNOSIS — D649 Anemia, unspecified: Secondary | ICD-10-CM | POA: Diagnosis not present

## 2015-04-02 ENCOUNTER — Encounter
Admission: RE | Admit: 2015-04-02 | Discharge: 2015-04-02 | Disposition: A | Discharge status: Home / Self Care | Payer: PPO | Source: Ambulatory Visit | Attending: Internal Medicine | Admitting: Internal Medicine

## 2015-04-04 DIAGNOSIS — F329 Major depressive disorder, single episode, unspecified: Secondary | ICD-10-CM | POA: Diagnosis not present

## 2015-04-13 DIAGNOSIS — G629 Polyneuropathy, unspecified: Secondary | ICD-10-CM | POA: Diagnosis not present

## 2015-04-13 DIAGNOSIS — S72141D Displaced intertrochanteric fracture of right femur, subsequent encounter for closed fracture with routine healing: Secondary | ICD-10-CM | POA: Diagnosis not present

## 2015-04-13 DIAGNOSIS — I2510 Atherosclerotic heart disease of native coronary artery without angina pectoris: Secondary | ICD-10-CM | POA: Diagnosis not present

## 2015-04-13 DIAGNOSIS — D649 Anemia, unspecified: Secondary | ICD-10-CM | POA: Diagnosis not present

## 2015-04-18 DIAGNOSIS — Z96652 Presence of left artificial knee joint: Secondary | ICD-10-CM | POA: Diagnosis not present

## 2015-04-26 DIAGNOSIS — G629 Polyneuropathy, unspecified: Secondary | ICD-10-CM | POA: Diagnosis not present

## 2015-04-26 DIAGNOSIS — D649 Anemia, unspecified: Secondary | ICD-10-CM | POA: Diagnosis not present

## 2015-04-26 DIAGNOSIS — S72141D Displaced intertrochanteric fracture of right femur, subsequent encounter for closed fracture with routine healing: Secondary | ICD-10-CM | POA: Diagnosis not present

## 2015-04-26 DIAGNOSIS — I2510 Atherosclerotic heart disease of native coronary artery without angina pectoris: Secondary | ICD-10-CM | POA: Diagnosis not present

## 2015-04-30 DIAGNOSIS — S72141D Displaced intertrochanteric fracture of right femur, subsequent encounter for closed fracture with routine healing: Secondary | ICD-10-CM | POA: Diagnosis not present

## 2015-04-30 DIAGNOSIS — I2510 Atherosclerotic heart disease of native coronary artery without angina pectoris: Secondary | ICD-10-CM | POA: Diagnosis not present

## 2015-04-30 DIAGNOSIS — G629 Polyneuropathy, unspecified: Secondary | ICD-10-CM | POA: Diagnosis not present

## 2015-04-30 DIAGNOSIS — D649 Anemia, unspecified: Secondary | ICD-10-CM | POA: Diagnosis not present

## 2015-05-03 DIAGNOSIS — I2510 Atherosclerotic heart disease of native coronary artery without angina pectoris: Secondary | ICD-10-CM | POA: Diagnosis not present

## 2015-05-03 DIAGNOSIS — S72141D Displaced intertrochanteric fracture of right femur, subsequent encounter for closed fracture with routine healing: Secondary | ICD-10-CM | POA: Diagnosis not present

## 2015-05-03 DIAGNOSIS — D649 Anemia, unspecified: Secondary | ICD-10-CM | POA: Diagnosis not present

## 2015-05-03 DIAGNOSIS — G629 Polyneuropathy, unspecified: Secondary | ICD-10-CM | POA: Diagnosis not present

## 2015-05-05 ENCOUNTER — Emergency Department: Payer: PPO

## 2015-05-05 ENCOUNTER — Emergency Department
Admission: EM | Admit: 2015-05-05 | Discharge: 2015-05-05 | Disposition: A | Discharge status: Home / Self Care | Payer: PPO | Attending: Emergency Medicine | Admitting: Emergency Medicine

## 2015-05-05 DIAGNOSIS — S79922A Unspecified injury of left thigh, initial encounter: Secondary | ICD-10-CM | POA: Diagnosis not present

## 2015-05-05 DIAGNOSIS — Z79899 Other long term (current) drug therapy: Secondary | ICD-10-CM | POA: Insufficient documentation

## 2015-05-05 DIAGNOSIS — Y998 Other external cause status: Secondary | ICD-10-CM | POA: Diagnosis not present

## 2015-05-05 DIAGNOSIS — Z7982 Long term (current) use of aspirin: Secondary | ICD-10-CM | POA: Diagnosis not present

## 2015-05-05 DIAGNOSIS — Y9289 Other specified places as the place of occurrence of the external cause: Secondary | ICD-10-CM | POA: Insufficient documentation

## 2015-05-05 DIAGNOSIS — M79605 Pain in left leg: Secondary | ICD-10-CM | POA: Diagnosis not present

## 2015-05-05 DIAGNOSIS — Y9301 Activity, walking, marching and hiking: Secondary | ICD-10-CM | POA: Insufficient documentation

## 2015-05-05 DIAGNOSIS — Z23 Encounter for immunization: Secondary | ICD-10-CM | POA: Insufficient documentation

## 2015-05-05 DIAGNOSIS — W01198A Fall on same level from slipping, tripping and stumbling with subsequent striking against other object, initial encounter: Secondary | ICD-10-CM | POA: Diagnosis not present

## 2015-05-05 DIAGNOSIS — R600 Localized edema: Secondary | ICD-10-CM | POA: Diagnosis not present

## 2015-05-05 DIAGNOSIS — W19XXXA Unspecified fall, initial encounter: Secondary | ICD-10-CM | POA: Diagnosis not present

## 2015-05-05 DIAGNOSIS — S6991XA Unspecified injury of right wrist, hand and finger(s), initial encounter: Secondary | ICD-10-CM | POA: Diagnosis not present

## 2015-05-05 DIAGNOSIS — S72001A Fracture of unspecified part of neck of right femur, initial encounter for closed fracture: Secondary | ICD-10-CM | POA: Diagnosis not present

## 2015-05-05 DIAGNOSIS — S7012XA Contusion of left thigh, initial encounter: Secondary | ICD-10-CM | POA: Diagnosis not present

## 2015-05-05 DIAGNOSIS — Z87891 Personal history of nicotine dependence: Secondary | ICD-10-CM | POA: Diagnosis not present

## 2015-05-05 DIAGNOSIS — S60212A Contusion of left wrist, initial encounter: Secondary | ICD-10-CM | POA: Diagnosis not present

## 2015-05-05 MED ORDER — TETANUS-DIPHTH-ACELL PERTUSSIS 5-2.5-18.5 LF-MCG/0.5 IM SUSP
0.5000 mL | Freq: Once | INTRAMUSCULAR | Status: AC
  Administered 2015-05-05: 0.5 mL via INTRAMUSCULAR
  Filled 2015-05-05: qty 0.5

## 2015-05-05 MED ORDER — TETANUS-DIPHTH-ACELL PERTUSSIS 5-2.5-18.5 LF-MCG/0.5 IM SUSP: 0.5000 mL | Freq: Once | INTRAMUSCULAR | Status: DC

## 2015-05-05 NOTE — ED Provider Notes (Addendum)
[facility] Emergency Department Provider Note   ____________________________________________  Time seen: Approximately 9AM I have reviewed the triage vital signs and the triage nursing note.  HISTORY  Chief Complaint Fall   Historian Patient  HPI Juan Mayo is a 82 y.o. male who recently had a right hip fracturea few weeks ago, but has been doing very well walking around with his walker. CT is in the garage and turned and lost his balance falling onto his right side. He has a scrape to his right ear which is nontender. Denies hitting his head or headache or loss of consciousness. No neck pain. He did hit his right wrist where there is some bruising, but denies significant bony tenderness. He stood up and experienced some pain in the mid thigh/femur area when he was walking. No chest pain or trouble breathing. No abdominal pain. Symptoms are mild.    Past Medical History  Diagnosis Date  . Prostate cancer ([place])   . Hypercholesteremia   . Neuropathy [facility])     Patient Active Problem List   Diagnosis Date Noted  . Hip fracture ([place]) 03/18/2015    Past Surgical History  Procedure Laterality Date  . Replacement total knee bilateral    . Coronary artery bypass graft    . Back surgery    . Aortic valve replacement    . Prostatectomy    . Fracture surgery Right   . Intramedullary (im) nail intertrochanteric Right 03/18/2015    Procedure: INTRAMEDULLARY (IM) NAIL INTERTROCHANTRIC;  Surgeon: [name], MD;  Location: [facility] ORS;  Service: Orthopedics;  Laterality: Right;    Current Outpatient Rx  Name  Route  Sig  Dispense  Refill  . aspirin 81 MG tablet   Oral   Take 81 mg by mouth daily.         Marland Kitchen docusate sodium (COLACE) 100 MG capsule   Oral   Take 1 capsule (100 mg total) by mouth 2 (two) times daily.   10 capsule   0   . EXPIRED: enoxaparin (LOVENOX) 40 MG/0.4ML injection   Subcutaneous   Inject 0.4 mLs (40 mg total) into the skin  daily.   5 mL   0   . gabapentin (NEURONTIN) 300 MG capsule   Oral   Take 300 mg by mouth daily.         . metoprolol tartrate (LOPRESSOR) 25 MG tablet   Oral   Take 25 mg by mouth 2 (two) times daily.         Marland Kitchen oxyCODONE (ROXICODONE) 5 MG immediate release tablet   Oral   Take 1-2 tablets (5-10 mg total) by mouth every 4 (four) hours as needed for moderate pain or severe pain.   30 tablet   0     Allergies Review of patient's allergies indicates no known allergies.  Family History  Problem Relation Age of Onset  . Hypertension Other     Social History Social History  Substance Use Topics  . Smoking status: Former Research scientist (life sciences)  . Smokeless tobacco: None  . Alcohol Use: No    Review of Systems  Constitutional: Negative for fever. Eyes: Negative for visual changes. ENT: Negative for sore throat. Cardiovascular: Negative for chest pain. Respiratory: Negative for shortness of breath. Gastrointestinal: Negative for abdominal pain, vomiting and diarrhea. Genitourinary: Negative for dysuria. Musculoskeletal: Negative for back pain. Skin: Negative for rash. Neurological: Negative for headache. 10 point Review of Systems otherwise negative ____________________________________________   PHYSICAL EXAM:  VITAL SIGNS: ED Triage Vitals  Enc Vitals Group     BP 05/05/15 0817 168/81 mmHg     Pulse Rate 05/05/15 0823 68     Resp 05/05/15 0823 19     Temp 05/05/15 0819 97.9 F (36.6 C)     Temp src --      SpO2 05/05/15 0823 100 %     Weight 05/05/15 0816 125 lb (56.7 kg)     Height 05/05/15 0816 5\' 8"  (1.727 m)     Head Cir --      Peak Flow --      Pain Score --      Pain Loc --      Pain Edu? --      Excl. in [place]? --      Constitutional: Alert and oriented. Well appearing and in no distress. HEENT   Head: Normocephalic and atraumatic.      Eyes: Conjunctivae are normal. PERRL. Normal extraocular movements.      Ears:         Nose: No  congestion/rhinnorhea.   Mouth/Throat: Mucous membranes are moist.   Neck: No stridor. Cardiovascular/Chest: Normal rate, regular rhythm.  No murmurs, rubs, or gallops. Respiratory: Normal respiratory effort without tachypnea nor retractions. Breath sounds are clear and equal bilaterally. No wheezes/rales/rhonchi. Gastrointestinal: Soft. No distention, no guarding, no rebound. Nontender.    Genitourinary/rectal:Deferred Musculoskeletal: Pelvis stable. Hip does not appear to be tender with range of motion at the right. Some tenderness to palpation of the mid femur/thigh without obvious ecchymosis or swelling. No joint effusions.  No lower extremity tenderness.  No edema. Right wrist with some old ecchymosis and a small amount of new ecchymosis. Very mild tenderness to palpation over the right wrist. Neurologic:  Normal speech and language. No gross or focal neurologic deficits are appreciated. Skin:  Skin is warm, dry and intact. No rash noted. Psychiatric: Mood and affect are normal. Speech and behavior are normal. Patient exhibits appropriate insight and judgment.  ____________________________________________   EKG I, Lisa Roca, MD, the attending physician have personally viewed and interpreted all ECGs.  74 bpm. Normal sinus rhythm. Narrow QRS. Normal axis. Normal ST and T-wave ____________________________________________  LABS (pertinent positives/negatives)  None  ____________________________________________  RADIOLOGY All Xrays were viewed by me. Imaging interpreted by Radiologist.  Right wrist: No acute osseous injury of the right wrist.  Femur:  Status post surgical internal fixation of old proximal right femoral fracture. No acute fracture or dislocation is noted. __________________________________________  PROCEDURES  Procedure(s) performed: None  Critical Care performed: None  ____________________________________________   ED COURSE / ASSESSMENT AND  PLAN  Pertinent labs & imaging results that were available during my care of the patient were reviewed by me and considered in my medical decision making (see chart for details).  This was a mechanical fall, I do not suspect underlying medical condition. No complaints on review of systems.  No clinical evidence of head or neck injury.  Low suspicion of bony injury, but did obtain x-rays of the wrist and the right femur, and there is no evidence of fracture.    CONSULTATIONS:   None   Patient / Family / Caregiver informed of clinical course, medical decision-making process, and agree with plan.   I discussed return precautions, follow-up instructions, and discharged instructions with patient and/or family.   ___________________________________________   FINAL CLINICAL IMPRESSION(S) / ED DIAGNOSES   Final diagnoses:  Thigh contusion, left, initial encounter  Wrist contusion, left,  initial encounter              Note: This dictation was prepared with Dragon dictation. Any transcriptional errors that result from this process are unintentional   Lisa Roca, MD 05/05/15 [place], MD 05/05/15 [phone]

## 2015-05-05 NOTE — ED Notes (Signed)
Patient transported to X-ray 

## 2015-05-05 NOTE — Discharge Instructions (Signed)
No serious injury was found after your fall. Return to the emergency department for any worsening pain, weakness, numbness, or signs of infection.   Contusion A contusion is a deep bruise. Contusions happen when an injury causes bleeding under the skin. Symptoms of bruising include pain, swelling, and discolored skin. The skin may turn blue, purple, or yellow. HOME CARE   Rest the injured area.  If told, put ice on the injured area.  Put ice in a plastic bag.  Place a towel between your skin and the bag.  Leave the ice on for 20 minutes, 2-3 times per day.  If told, put light pressure (compression) on the injured area using an elastic bandage. Make sure the bandage is not too tight. Remove it and put it back on as told by your doctor.  If possible, raise (elevate) the injured area above the level of your heart while you are sitting or lying down.  Take over-the-counter and prescription medicines only as told by your doctor. GET HELP IF:  Your symptoms do not get better after several days of treatment.  Your symptoms get worse.  You have trouble moving the injured area. GET HELP RIGHT AWAY IF:   You have very bad pain.  You have a loss of feeling (numbness) in a hand or foot.  Your hand or foot turns pale or cold.   This information is not intended to replace advice given to you by your health care provider. Make sure you discuss any questions you have with your health care provider.   Document Released: 09/03/2007 Document Revised: 12/06/2014 Document Reviewed: 08/02/2014 Elsevier Interactive Patient Education [employer].

## 2015-05-05 NOTE — ED Notes (Signed)
MD at bedside. 

## 2015-05-05 NOTE — ED Notes (Signed)
Pt from home via EMS, reports fell last night. Lives by himself. Previous hip fracture 6 weeks ago. C/O R. Leg pain today

## 2015-05-07 DIAGNOSIS — S72141D Displaced intertrochanteric fracture of right femur, subsequent encounter for closed fracture with routine healing: Secondary | ICD-10-CM | POA: Diagnosis not present

## 2015-05-07 DIAGNOSIS — I2510 Atherosclerotic heart disease of native coronary artery without angina pectoris: Secondary | ICD-10-CM | POA: Diagnosis not present

## 2015-05-07 DIAGNOSIS — D649 Anemia, unspecified: Secondary | ICD-10-CM | POA: Diagnosis not present

## 2015-05-07 DIAGNOSIS — G629 Polyneuropathy, unspecified: Secondary | ICD-10-CM | POA: Diagnosis not present

## 2015-05-10 DIAGNOSIS — S72141D Displaced intertrochanteric fracture of right femur, subsequent encounter for closed fracture with routine healing: Secondary | ICD-10-CM | POA: Diagnosis not present

## 2015-05-10 DIAGNOSIS — G629 Polyneuropathy, unspecified: Secondary | ICD-10-CM | POA: Diagnosis not present

## 2015-05-10 DIAGNOSIS — D649 Anemia, unspecified: Secondary | ICD-10-CM | POA: Diagnosis not present

## 2015-05-10 DIAGNOSIS — I2510 Atherosclerotic heart disease of native coronary artery without angina pectoris: Secondary | ICD-10-CM | POA: Diagnosis not present

## 2015-05-14 DIAGNOSIS — D649 Anemia, unspecified: Secondary | ICD-10-CM | POA: Diagnosis not present

## 2015-05-14 DIAGNOSIS — S72141D Displaced intertrochanteric fracture of right femur, subsequent encounter for closed fracture with routine healing: Secondary | ICD-10-CM | POA: Diagnosis not present

## 2015-05-14 DIAGNOSIS — I2510 Atherosclerotic heart disease of native coronary artery without angina pectoris: Secondary | ICD-10-CM | POA: Diagnosis not present

## 2015-05-14 DIAGNOSIS — G629 Polyneuropathy, unspecified: Secondary | ICD-10-CM | POA: Diagnosis not present

## 2015-05-16 DIAGNOSIS — Z967 Presence of other bone and tendon implants: Secondary | ICD-10-CM | POA: Diagnosis not present

## 2015-05-16 DIAGNOSIS — Z8781 Personal history of (healed) traumatic fracture: Secondary | ICD-10-CM | POA: Diagnosis not present

## 2015-05-17 DIAGNOSIS — S72141D Displaced intertrochanteric fracture of right femur, subsequent encounter for closed fracture with routine healing: Secondary | ICD-10-CM | POA: Diagnosis not present

## 2015-05-17 DIAGNOSIS — D649 Anemia, unspecified: Secondary | ICD-10-CM | POA: Diagnosis not present

## 2015-05-17 DIAGNOSIS — I2510 Atherosclerotic heart disease of native coronary artery without angina pectoris: Secondary | ICD-10-CM | POA: Diagnosis not present

## 2015-05-17 DIAGNOSIS — G629 Polyneuropathy, unspecified: Secondary | ICD-10-CM | POA: Diagnosis not present

## 2015-05-23 DIAGNOSIS — S72141D Displaced intertrochanteric fracture of right femur, subsequent encounter for closed fracture with routine healing: Secondary | ICD-10-CM | POA: Diagnosis not present

## 2015-05-23 DIAGNOSIS — I2510 Atherosclerotic heart disease of native coronary artery without angina pectoris: Secondary | ICD-10-CM | POA: Diagnosis not present

## 2015-05-23 DIAGNOSIS — D649 Anemia, unspecified: Secondary | ICD-10-CM | POA: Diagnosis not present

## 2015-05-23 DIAGNOSIS — G629 Polyneuropathy, unspecified: Secondary | ICD-10-CM | POA: Diagnosis not present

## 2015-06-22 DIAGNOSIS — I25810 Atherosclerosis of coronary artery bypass graft(s) without angina pectoris: Secondary | ICD-10-CM | POA: Diagnosis not present

## 2015-06-22 DIAGNOSIS — I471 Supraventricular tachycardia: Secondary | ICD-10-CM | POA: Diagnosis not present

## 2015-06-22 DIAGNOSIS — M25551 Pain in right hip: Secondary | ICD-10-CM | POA: Diagnosis not present

## 2015-06-22 DIAGNOSIS — E782 Mixed hyperlipidemia: Secondary | ICD-10-CM | POA: Diagnosis not present

## 2015-06-22 DIAGNOSIS — Z0000 Encounter for general adult medical examination without abnormal findings: Secondary | ICD-10-CM | POA: Diagnosis not present

## 2015-07-04 DIAGNOSIS — M25551 Pain in right hip: Secondary | ICD-10-CM | POA: Diagnosis not present

## 2015-07-04 DIAGNOSIS — E782 Mixed hyperlipidemia: Secondary | ICD-10-CM | POA: Diagnosis not present

## 2015-07-04 DIAGNOSIS — M5030 Other cervical disc degeneration, unspecified cervical region: Secondary | ICD-10-CM | POA: Diagnosis not present

## 2015-07-04 DIAGNOSIS — I25810 Atherosclerosis of coronary artery bypass graft(s) without angina pectoris: Secondary | ICD-10-CM | POA: Diagnosis not present

## 2015-07-18 DIAGNOSIS — S72001D Fracture of unspecified part of neck of right femur, subsequent encounter for closed fracture with routine healing: Secondary | ICD-10-CM | POA: Diagnosis not present

## 2015-07-18 DIAGNOSIS — M539 Dorsopathy, unspecified: Secondary | ICD-10-CM | POA: Diagnosis not present

## 2015-07-19 ENCOUNTER — Other Ambulatory Visit: Payer: Self-pay | Admitting: Orthopedic Surgery

## 2015-07-19 DIAGNOSIS — M5386 Other specified dorsopathies, lumbar region: Secondary | ICD-10-CM

## 2015-07-20 ENCOUNTER — Other Ambulatory Visit: Payer: Self-pay | Admitting: Orthopedic Surgery

## 2015-07-20 DIAGNOSIS — M5386 Other specified dorsopathies, lumbar region: Secondary | ICD-10-CM

## 2015-07-25 ENCOUNTER — Ambulatory Visit
Admission: RE | Admit: 2015-07-25 | Discharge: 2015-07-25 | Disposition: A | Discharge status: Home / Self Care | Payer: PPO | Source: Ambulatory Visit | Attending: Orthopedic Surgery | Admitting: Orthopedic Surgery

## 2015-07-25 DIAGNOSIS — M539 Dorsopathy, unspecified: Secondary | ICD-10-CM | POA: Insufficient documentation

## 2015-07-25 DIAGNOSIS — M47816 Spondylosis without myelopathy or radiculopathy, lumbar region: Secondary | ICD-10-CM | POA: Diagnosis not present

## 2015-07-25 DIAGNOSIS — Z9889 Other specified postprocedural states: Secondary | ICD-10-CM | POA: Insufficient documentation

## 2015-07-25 DIAGNOSIS — M5386 Other specified dorsopathies, lumbar region: Secondary | ICD-10-CM

## 2015-07-25 DIAGNOSIS — M4806 Spinal stenosis, lumbar region: Secondary | ICD-10-CM | POA: Diagnosis not present

## 2015-08-01 DIAGNOSIS — M4806 Spinal stenosis, lumbar region: Secondary | ICD-10-CM | POA: Diagnosis not present

## 2015-08-01 DIAGNOSIS — M5136 Other intervertebral disc degeneration, lumbar region: Secondary | ICD-10-CM | POA: Diagnosis not present

## 2015-08-01 DIAGNOSIS — M5416 Radiculopathy, lumbar region: Secondary | ICD-10-CM | POA: Diagnosis not present

## 2015-08-07 ENCOUNTER — Ambulatory Visit: Payer: PPO

## 2015-08-08 DIAGNOSIS — M5136 Other intervertebral disc degeneration, lumbar region: Secondary | ICD-10-CM | POA: Diagnosis not present

## 2015-08-08 DIAGNOSIS — M4806 Spinal stenosis, lumbar region: Secondary | ICD-10-CM | POA: Diagnosis not present

## 2015-08-08 DIAGNOSIS — M5416 Radiculopathy, lumbar region: Secondary | ICD-10-CM | POA: Diagnosis not present

## 2015-09-04 DIAGNOSIS — M5136 Other intervertebral disc degeneration, lumbar region: Secondary | ICD-10-CM | POA: Diagnosis not present

## 2015-09-04 DIAGNOSIS — M5416 Radiculopathy, lumbar region: Secondary | ICD-10-CM | POA: Diagnosis not present

## 2015-09-04 DIAGNOSIS — M4806 Spinal stenosis, lumbar region: Secondary | ICD-10-CM | POA: Diagnosis not present

## 2015-10-04 DIAGNOSIS — M5416 Radiculopathy, lumbar region: Secondary | ICD-10-CM | POA: Diagnosis not present

## 2015-10-04 DIAGNOSIS — M5136 Other intervertebral disc degeneration, lumbar region: Secondary | ICD-10-CM | POA: Diagnosis not present

## 2015-10-04 DIAGNOSIS — M4806 Spinal stenosis, lumbar region: Secondary | ICD-10-CM | POA: Diagnosis not present

## 2015-11-06 DIAGNOSIS — D485 Neoplasm of uncertain behavior of skin: Secondary | ICD-10-CM | POA: Diagnosis not present

## 2015-11-06 DIAGNOSIS — X32XXXA Exposure to sunlight, initial encounter: Secondary | ICD-10-CM | POA: Diagnosis not present

## 2015-11-06 DIAGNOSIS — L821 Other seborrheic keratosis: Secondary | ICD-10-CM | POA: Diagnosis not present

## 2015-11-06 DIAGNOSIS — C44329 Squamous cell carcinoma of skin of other parts of face: Secondary | ICD-10-CM | POA: Diagnosis not present

## 2015-11-06 DIAGNOSIS — L570 Actinic keratosis: Secondary | ICD-10-CM | POA: Diagnosis not present

## 2015-11-14 DIAGNOSIS — M79675 Pain in left toe(s): Secondary | ICD-10-CM | POA: Diagnosis not present

## 2015-11-14 DIAGNOSIS — B351 Tinea unguium: Secondary | ICD-10-CM | POA: Diagnosis not present

## 2015-11-14 DIAGNOSIS — M79674 Pain in right toe(s): Secondary | ICD-10-CM | POA: Diagnosis not present

## 2015-11-14 DIAGNOSIS — M2170 Unequal limb length (acquired), unspecified site: Secondary | ICD-10-CM | POA: Diagnosis not present

## 2015-12-25 DIAGNOSIS — R0602 Shortness of breath: Secondary | ICD-10-CM | POA: Diagnosis not present

## 2015-12-25 DIAGNOSIS — I25810 Atherosclerosis of coronary artery bypass graft(s) without angina pectoris: Secondary | ICD-10-CM | POA: Diagnosis not present

## 2015-12-25 DIAGNOSIS — I340 Nonrheumatic mitral (valve) insufficiency: Secondary | ICD-10-CM | POA: Diagnosis not present

## 2015-12-25 DIAGNOSIS — R002 Palpitations: Secondary | ICD-10-CM | POA: Diagnosis not present

## 2015-12-25 DIAGNOSIS — I471 Supraventricular tachycardia: Secondary | ICD-10-CM | POA: Diagnosis not present

## 2015-12-25 DIAGNOSIS — I350 Nonrheumatic aortic (valve) stenosis: Secondary | ICD-10-CM | POA: Diagnosis not present

## 2015-12-28 DIAGNOSIS — L905 Scar conditions and fibrosis of skin: Secondary | ICD-10-CM | POA: Diagnosis not present

## 2015-12-28 DIAGNOSIS — D0439 Carcinoma in situ of skin of other parts of face: Secondary | ICD-10-CM | POA: Diagnosis not present

## 2015-12-28 DIAGNOSIS — D485 Neoplasm of uncertain behavior of skin: Secondary | ICD-10-CM | POA: Diagnosis not present

## 2015-12-28 DIAGNOSIS — C44329 Squamous cell carcinoma of skin of other parts of face: Secondary | ICD-10-CM | POA: Diagnosis not present

## 2016-01-02 DIAGNOSIS — R0602 Shortness of breath: Secondary | ICD-10-CM | POA: Diagnosis not present

## 2016-01-08 DIAGNOSIS — R002 Palpitations: Secondary | ICD-10-CM | POA: Diagnosis not present

## 2016-01-08 DIAGNOSIS — M5030 Other cervical disc degeneration, unspecified cervical region: Secondary | ICD-10-CM | POA: Diagnosis not present

## 2016-01-08 DIAGNOSIS — I471 Supraventricular tachycardia: Secondary | ICD-10-CM | POA: Diagnosis not present

## 2016-01-08 DIAGNOSIS — E782 Mixed hyperlipidemia: Secondary | ICD-10-CM | POA: Diagnosis not present

## 2016-01-08 DIAGNOSIS — I25810 Atherosclerosis of coronary artery bypass graft(s) without angina pectoris: Secondary | ICD-10-CM | POA: Diagnosis not present

## 2016-01-08 DIAGNOSIS — Z0000 Encounter for general adult medical examination without abnormal findings: Secondary | ICD-10-CM | POA: Diagnosis not present

## 2016-01-16 DIAGNOSIS — I350 Nonrheumatic aortic (valve) stenosis: Secondary | ICD-10-CM | POA: Diagnosis not present

## 2016-01-21 DIAGNOSIS — I471 Supraventricular tachycardia: Secondary | ICD-10-CM | POA: Diagnosis not present

## 2016-01-21 DIAGNOSIS — I25810 Atherosclerosis of coronary artery bypass graft(s) without angina pectoris: Secondary | ICD-10-CM | POA: Diagnosis not present

## 2016-01-21 DIAGNOSIS — I350 Nonrheumatic aortic (valve) stenosis: Secondary | ICD-10-CM | POA: Diagnosis not present

## 2016-01-21 DIAGNOSIS — I493 Ventricular premature depolarization: Secondary | ICD-10-CM | POA: Diagnosis not present

## 2016-01-24 DIAGNOSIS — D485 Neoplasm of uncertain behavior of skin: Secondary | ICD-10-CM | POA: Diagnosis not present

## 2016-01-24 DIAGNOSIS — L905 Scar conditions and fibrosis of skin: Secondary | ICD-10-CM | POA: Diagnosis not present

## 2016-01-24 DIAGNOSIS — D0439 Carcinoma in situ of skin of other parts of face: Secondary | ICD-10-CM | POA: Diagnosis not present

## 2016-02-08 DIAGNOSIS — T498X5A Adverse effect of other topical agents, initial encounter: Secondary | ICD-10-CM | POA: Diagnosis not present

## 2016-02-23 ENCOUNTER — Emergency Department: Payer: PPO

## 2016-02-23 ENCOUNTER — Inpatient Hospital Stay: Payer: PPO

## 2016-02-23 ENCOUNTER — Inpatient Hospital Stay (HOSPITAL_COMMUNITY)
Admit: 2016-02-23 | Discharge: 2016-02-23 | Disposition: A | Discharge status: Home / Self Care | Payer: PPO | Attending: Internal Medicine | Admitting: Internal Medicine

## 2016-02-23 ENCOUNTER — Inpatient Hospital Stay
Admission: EM | Admit: 2016-02-23 | Discharge: 2016-02-25 | DRG: 065 | Disposition: A | Discharge status: Skilled Nursing Facility | Payer: PPO | Attending: Internal Medicine | Admitting: Internal Medicine

## 2016-02-23 ENCOUNTER — Encounter: Payer: Self-pay | Admitting: Emergency Medicine

## 2016-02-23 DIAGNOSIS — I5030 Unspecified diastolic (congestive) heart failure: Secondary | ICD-10-CM

## 2016-02-23 DIAGNOSIS — E785 Hyperlipidemia, unspecified: Secondary | ICD-10-CM | POA: Diagnosis not present

## 2016-02-23 DIAGNOSIS — I679 Cerebrovascular disease, unspecified: Secondary | ICD-10-CM | POA: Diagnosis not present

## 2016-02-23 DIAGNOSIS — H510 Palsy (spasm) of conjugate gaze: Secondary | ICD-10-CM | POA: Diagnosis present

## 2016-02-23 DIAGNOSIS — I639 Cerebral infarction, unspecified: Secondary | ICD-10-CM | POA: Diagnosis not present

## 2016-02-23 DIAGNOSIS — R471 Dysarthria and anarthria: Secondary | ICD-10-CM | POA: Diagnosis not present

## 2016-02-23 DIAGNOSIS — Z952 Presence of prosthetic heart valve: Secondary | ICD-10-CM | POA: Diagnosis not present

## 2016-02-23 DIAGNOSIS — Z951 Presence of aortocoronary bypass graft: Secondary | ICD-10-CM | POA: Diagnosis not present

## 2016-02-23 DIAGNOSIS — Z8546 Personal history of malignant neoplasm of prostate: Secondary | ICD-10-CM

## 2016-02-23 DIAGNOSIS — R531 Weakness: Secondary | ICD-10-CM | POA: Diagnosis not present

## 2016-02-23 DIAGNOSIS — I6789 Other cerebrovascular disease: Secondary | ICD-10-CM | POA: Diagnosis not present

## 2016-02-23 DIAGNOSIS — G8194 Hemiplegia, unspecified affecting left nondominant side: Secondary | ICD-10-CM | POA: Diagnosis present

## 2016-02-23 DIAGNOSIS — R410 Disorientation, unspecified: Secondary | ICD-10-CM | POA: Diagnosis not present

## 2016-02-23 DIAGNOSIS — R29810 Facial weakness: Secondary | ICD-10-CM | POA: Diagnosis present

## 2016-02-23 DIAGNOSIS — I63311 Cerebral infarction due to thrombosis of right middle cerebral artery: Secondary | ICD-10-CM | POA: Diagnosis not present

## 2016-02-23 DIAGNOSIS — Z96653 Presence of artificial knee joint, bilateral: Secondary | ICD-10-CM | POA: Diagnosis present

## 2016-02-23 DIAGNOSIS — Z79899 Other long term (current) drug therapy: Secondary | ICD-10-CM | POA: Diagnosis not present

## 2016-02-23 DIAGNOSIS — Z87891 Personal history of nicotine dependence: Secondary | ICD-10-CM

## 2016-02-23 DIAGNOSIS — I6340 Cerebral infarction due to embolism of unspecified cerebral artery: Principal | ICD-10-CM | POA: Diagnosis present

## 2016-02-23 DIAGNOSIS — I10 Essential (primary) hypertension: Secondary | ICD-10-CM | POA: Diagnosis present

## 2016-02-23 DIAGNOSIS — I6523 Occlusion and stenosis of bilateral carotid arteries: Secondary | ICD-10-CM | POA: Diagnosis not present

## 2016-02-23 LAB — CBC
HCT: 34.3 % — ABNORMAL LOW (ref 40.0–52.0)
Hemoglobin: 11.7 g/dL — ABNORMAL LOW (ref 13.0–18.0)
MCH: 31 pg (ref 26.0–34.0)
MCHC: 34 g/dL (ref 32.0–36.0)
MCV: 91.1 fL (ref 80.0–100.0)
PLATELETS: 214 10*3/uL (ref 150–440)
RBC: 3.77 MIL/uL — ABNORMAL LOW (ref 4.40–5.90)
RDW: 12.9 % (ref 11.5–14.5)
WBC: 6.1 10*3/uL (ref 3.8–10.6)

## 2016-02-23 LAB — COMPREHENSIVE METABOLIC PANEL
ALT: 14 U/L — ABNORMAL LOW (ref 17–63)
ANION GAP: 7 (ref 5–15)
AST: 17 U/L (ref 15–41)
Albumin: 3.8 g/dL (ref 3.5–5.0)
Alkaline Phosphatase: 98 U/L (ref 38–126)
BUN: 14 mg/dL (ref 6–20)
CHLORIDE: 99 mmol/L — AB (ref 101–111)
CO2: 30 mmol/L (ref 22–32)
Calcium: 8.9 mg/dL (ref 8.9–10.3)
Creatinine, Ser: 0.72 mg/dL (ref 0.61–1.24)
Glucose, Bld: 95 mg/dL (ref 65–99)
Potassium: 4.2 mmol/L (ref 3.5–5.1)
SODIUM: 136 mmol/L (ref 135–145)
Total Bilirubin: 0.8 mg/dL (ref 0.3–1.2)
Total Protein: 6.8 g/dL (ref 6.5–8.1)

## 2016-02-23 LAB — DIFFERENTIAL
BASOS PCT: 1 %
Basophils Absolute: 0 10*3/uL (ref 0–0.1)
EOS ABS: 0 10*3/uL (ref 0–0.7)
EOS PCT: 0 %
Lymphocytes Relative: 6 %
Lymphs Abs: 0.3 10*3/uL — ABNORMAL LOW (ref 1.0–3.6)
MONO ABS: 0.3 10*3/uL (ref 0.2–1.0)
Monocytes Relative: 5 %
Neutro Abs: 5.5 10*3/uL (ref 1.4–6.5)
Neutrophils Relative %: 88 %

## 2016-02-23 LAB — APTT: aPTT: 29 seconds (ref 24–36)

## 2016-02-23 LAB — PROTIME-INR
INR: 1.05
PROTHROMBIN TIME: 13.7 s (ref 11.4–15.2)

## 2016-02-23 LAB — TROPONIN I

## 2016-02-23 MED ORDER — SODIUM CHLORIDE 0.9 % IV SOLN
INTRAVENOUS | Status: DC
  Administered 2016-02-23: 17:00:00 via INTRAVENOUS

## 2016-02-23 MED ORDER — ASPIRIN 300 MG RE SUPP
300.0000 mg | Freq: Every day | RECTAL | Status: DC
  Administered 2016-02-23: 18:00:00 300 mg via RECTAL
  Filled 2016-02-23: qty 1

## 2016-02-23 MED ORDER — SODIUM CHLORIDE 0.9 % IV BOLUS (SEPSIS)
1000.0000 mL | Freq: Once | INTRAVENOUS | Status: AC
  Administered 2016-02-23: 1000 mL via INTRAVENOUS

## 2016-02-23 MED ORDER — SENNOSIDES-DOCUSATE SODIUM 8.6-50 MG PO TABS: 1.0000 | ORAL_TABLET | Freq: Every evening | ORAL | Status: DC | PRN

## 2016-02-23 MED ORDER — STROKE: EARLY STAGES OF RECOVERY BOOK
Freq: Once | Status: AC
  Administered 2016-02-23: 16:00:00

## 2016-02-23 MED ORDER — ASPIRIN 325 MG PO TABS
325.0000 mg | ORAL_TABLET | Freq: Every day | ORAL | Status: DC
  Administered 2016-02-24 – 2016-02-25 (×2): 325 mg via ORAL
  Filled 2016-02-23 (×2): qty 1

## 2016-02-23 MED ORDER — ENOXAPARIN SODIUM 40 MG/0.4ML ~~LOC~~ SOLN
40.0000 mg | SUBCUTANEOUS | Status: DC
  Administered 2016-02-23 – 2016-02-24 (×2): 40 mg via SUBCUTANEOUS
  Filled 2016-02-23 (×2): qty 0.4

## 2016-02-23 MED ORDER — ATORVASTATIN CALCIUM 20 MG PO TABS
40.0000 mg | ORAL_TABLET | Freq: Every day | ORAL | Status: DC
  Administered 2016-02-24: 18:00:00 40 mg via ORAL
  Filled 2016-02-23: qty 2

## 2016-02-23 NOTE — H&P (Addendum)
[facility] [place] NAME: Juan Mayo    MR#:  [id number]  DATE OF BIRTH:  May 23, 1921  DATE OF ADMISSION:  02/23/2016  PRIMARY CARE PHYSICIAN: Leonel Ramsay, MD   REQUESTING/REFERRING PHYSICIAN: Merlyn Lot, MD  CHIEF COMPLAINT:   Chief Complaint  Patient presents with  . Cerebrovascular Accident    left sided weakness associated with dysarthria at 4 am today. HISTORY OF PRESENT ILLNESS:  Juan Mayo  is a 82 y.o. male with a known history of HTN, HLP and prostate cancer. presents with left sided weakness associated with dysarthria and right sided gaze palsy and left facial droop that a was noted upon awakening from sleep around 4 AM. He was normal last night. The patient denies any syncope, loss of consciousness, focal weakness or incontinence. CT head shows right parietal lobe peripheral CVA.  PAST MEDICAL HISTORY:   Past Medical History:  Diagnosis Date  . Hypercholesteremia   . Neuropathy ([place])   . Prostate cancer ([place])     PAST SURGICAL HISTORY:   Past Surgical History:  Procedure Laterality Date  . AORTIC VALVE REPLACEMENT    . BACK SURGERY    . CORONARY ARTERY BYPASS GRAFT    . FRACTURE SURGERY Right   . INTRAMEDULLARY (IM) NAIL INTERTROCHANTERIC Right 03/18/2015   Procedure: INTRAMEDULLARY (IM) NAIL INTERTROCHANTRIC;  Surgeon: [name], MD;  Location: [facility] ORS;  Service: Orthopedics;  Laterality: Right;  . PROSTATECTOMY    . REPLACEMENT TOTAL KNEE BILATERAL      SOCIAL HISTORY:   Social History  Substance Use Topics  . Smoking status: Former Research scientist (life sciences)  . Smokeless tobacco: Never Used  . Alcohol use No    FAMILY HISTORY:   Family History  Problem Relation Age of Onset  . Hypertension Other     DRUG ALLERGIES:  No Known Allergies  REVIEW OF SYSTEMS:   Review of Systems  Constitutional: Negative for chills, fever and malaise/fatigue.  HENT: Negative for hearing loss.   Eyes: Negative for  blurred vision and double vision.  Respiratory: Negative for cough, shortness of breath, wheezing and stridor.   Cardiovascular: Negative for chest pain and leg swelling.  Gastrointestinal: Negative for abdominal pain, blood in stool, diarrhea, melena, nausea and vomiting.  Genitourinary: Negative for dysuria and hematuria.  Musculoskeletal: Negative for back pain and joint pain.  Neurological: Positive for speech change. Negative for dizziness, tingling, tremors, focal weakness, loss of consciousness, weakness and headaches.  Psychiatric/Behavioral: Negative for depression. The patient is not nervous/anxious.     MEDICATIONS AT HOME:   Prior to Admission medications   Medication Sig Start Date End Date Taking? Authorizing Provider  aspirin 81 MG tablet Take 81 mg by mouth daily.   Yes Historical Provider, MD  metoprolol tartrate (LOPRESSOR) 25 MG tablet Take 25 mg by mouth 2 (two) times daily.   Yes Historical Provider, MD      VITAL SIGNS:  Blood pressure (!) 158/74, pulse 87, temperature 98 F (36.7 C), temperature source Oral, resp. rate (!) 21, height 5\' 7"  (1.702 m), weight 126 lb 12.8 oz (57.5 kg), SpO2 99 %.  PHYSICAL EXAMINATION:  Physical Exam  GENERAL:  82 y.o.-year-old patient lying in the bed with no acute distress.  EYES: Pupils equal, round, reactive to light and accommodation. No scleral icterus. Extraocular muscles intact.  HEENT: Head atraumatic, normocephalic. Oropharynx and nasopharynx clear.  NECK:  Supple, no jugular venous distention. No thyroid enlargement, no tenderness.  LUNGS: Normal breath sounds bilaterally, no wheezing, rales,rhonchi or crepitation. No use of accessory muscles of respiration.  CARDIOVASCULAR: S1, S2 normal. No murmurs, rubs, or gallops.  ABDOMEN: Soft, nontender, nondistended. Bowel sounds present. No organomegaly or mass.  EXTREMITIES: No pedal edema, cyanosis, or clubbing.  NEUROLOGIC: Left facial droop and dysarthria . Muscle  strength 5/5 in right extremities and 4-5/5 in left extremities. Sensation intact. Gait not checked.  PSYCHIATRIC: The patient is alert and oriented x 3.  SKIN: No obvious rash, lesion, or ulcer.   LABORATORY PANEL:   CBC  Recent Labs Lab 02/23/16 1329  WBC 6.1  HGB 11.7*  HCT 34.3*  PLT 214   ------------------------------------------------------------------------------------------------------------------  Chemistries   Recent Labs Lab 02/23/16 1329  NA 136  K 4.2  CL 99*  CO2 30  GLUCOSE 95  BUN 14  CREATININE 0.72  CALCIUM 8.9  AST 17  ALT 14*  ALKPHOS 98  BILITOT 0.8   ------------------------------------------------------------------------------------------------------------------  Cardiac Enzymes  Recent Labs Lab 02/23/16 1329  TROPONINI <0.03   ------------------------------------------------------------------------------------------------------------------  RADIOLOGY:  Dg Chest 1 View  Result Date: 02/23/2016 CLINICAL DATA:  Facial droop, confusion. EXAM: CHEST 1 VIEW COMPARISON:  None. FINDINGS: The heart size and mediastinal contours are within normal limits. Both lungs are clear. No pneumothorax or pleural effusion is noted. Status post coronary bypass graft. The visualized skeletal structures are unremarkable. IMPRESSION: No acute cardiopulmonary abnormality seen. Electronically Signed   By: Marijo Conception, M.D.   On: 02/23/2016 14:12   Ct Head Wo Contrast  Result Date: 02/23/2016 CLINICAL DATA:  Confusion, left side weakness, facial droop EXAM: CT HEAD WITHOUT CONTRAST TECHNIQUE: Contiguous axial images were obtained from the base of the skull through the vertex without intravenous contrast. COMPARISON:  None. FINDINGS: Brain: No intracranial hemorrhage, mass effect or midline shift. Axial image 21 and 22 there is subtle area of decreased attenuation in right parietal lobe peripheral. Evolving ischemia/infarct cannot be excluded. Clinical  correlation is necessary. Further correlation with MRI is recommended. Mild cerebral atrophy. Periventricular and patchy subcortical white matter decreased attenuation probable due to chronic small vessel ischemic changes. Vascular: Atherosclerotic calcifications of carotid siphon Skull: No skull fracture is noted. Sinuses/Orbits: No acute findings Other: None IMPRESSION: No intracranial hemorrhage, mass effect or midline shift. Axial image 21 and 22 there is subtle area of decreased attenuation in right parietal lobe peripheral. Evolving ischemia/infarct is highly suspected. Clinical correlation is necessary. Further correlation with MRI is recommended. Electronically Signed   By: Lahoma Crocker M.D.   On: 02/23/2016 14:18      IMPRESSION AND PLAN:   Acute CVA.  ASA 325 mg daily and lipitor. Follow up MRI/MRA brain, echo, carotid US. Speech study, PT and OT. Neuro check. Neuro consult.  HTN. Hold lopressor for permissive BP.  HLP. lipitor.  All the records are reviewed and case discussed with ED provider. Management plans discussed with the patient, his sister (POA) and they are in agreement.  CODE STATUS:   TOTAL TIME TAKING CARE OF THIS PATIENT: 58 minutes.    Demetrios Loll M.D on 02/23/2016 at 3:18 PM  Between 7am to 6pm - Pager - [phone]  After 6pm go to [email or web address] - Technical brewer [facility]  Office  [phone]  CC: Primary care physician; FITZGERALD, DAVID Mamie Nick, MD   Note: This dictation was prepared with Dragon dictation along with smaller phrase technology. Any transcriptional errors that result from this process are unintentional.

## 2016-02-23 NOTE — ED Provider Notes (Signed)
[facility] Emergency Department Provider Note    First MD Initiated Contact with Patient 02/23/16 1318     (approximate)  I have reviewed the triage vital signs and the nursing notes.   HISTORY  Chief Complaint Cerebrovascular Accident    HPI Juan Mayo is a 82 y.o. male with a history of prostate cancer and high cholesterol presents with left sided weakness associated with dysarthria and right sided gaze palsy and left facial droop that a was noted upon awakening from sleep around 4 AM. Last known normal was last night. Patient is not on any blood thinners. He denies any previous history of stroke. Is status post CABG. No recent fevers. EMS states that in route the patient's left sided weakness has improved.   Past Medical History:  Diagnosis Date  . Hypercholesteremia   . Neuropathy ([place])   . Prostate cancer [facility])    Family History  Problem Relation Age of Onset  . Hypertension Other    Past Surgical History:  Procedure Laterality Date  . AORTIC VALVE REPLACEMENT    . BACK SURGERY    . CORONARY ARTERY BYPASS GRAFT    . FRACTURE SURGERY Right   . INTRAMEDULLARY (IM) NAIL INTERTROCHANTERIC Right 03/18/2015   Procedure: INTRAMEDULLARY (IM) NAIL INTERTROCHANTRIC;  Surgeon: [name], MD;  Location: [facility] ORS;  Service: Orthopedics;  Laterality: Right;  . PROSTATECTOMY    . REPLACEMENT TOTAL KNEE BILATERAL     Patient Active Problem List   Diagnosis Date Noted  . CVA (cerebral vascular accident) ([place]) 02/23/2016  . Hip fracture ([place]) 03/18/2015      Prior to Admission medications   Medication Sig Start Date End Date Taking? Authorizing Provider  aspirin 81 MG tablet Take 81 mg by mouth daily.   Yes Historical Provider, MD  metoprolol tartrate (LOPRESSOR) 25 MG tablet Take 25 mg by mouth 2 (two) times daily.   Yes Historical Provider, MD    Allergies Patient has no known allergies.    Social History Social History  Substance Use  Topics  . Smoking status: Former Research scientist (life sciences)  . Smokeless tobacco: Never Used  . Alcohol use No    Review of Systems Patient denies headaches, rhinorrhea, blurry vision, numbness, shortness of breath, chest pain, edema, cough, abdominal pain, nausea, vomiting, diarrhea, dysuria, fevers, rashes or hallucinations unless otherwise stated above in HPI. ____________________________________________   PHYSICAL EXAM:  VITAL SIGNS: Vitals:   02/23/16 1334 02/23/16 1436  BP:  (!) 153/78  Pulse: 87 87  Resp: (!) 21 (!) 21  Temp: 98 F (36.7 C)    Constitutional: Alert and oriented. Eyes: Conjunctivae are normal. PERRL. Unable to cross midline. Right word gaze palsy Head: Atraumatic. Nose: No congestion/rhinnorhea. Mouth/Throat: Mucous membranes are moist.  Oropharynx non-erythematous. Neck: No stridor. Painless ROM. No cervical spine tenderness to palpation Hematological/Lymphatic/Immunilogical: No cervical lymphadenopathy. Cardiovascular: Normal rate, regular rhythm. Grossly normal heart sounds.  Good peripheral circulation. Respiratory: Normal respiratory effort.  No retractions. Lungs CTAB. Gastrointestinal: Soft and nontender. No distention. No abdominal bruits. No CVA tenderness. Musculoskeletal: No lower extremity tenderness nor edema.  No joint effusions. Neurologic:  Dysarthric speech. Right ward gaze palsy. No visual field cut. Decreased sensation left upper extremity left lower extremity. As left facial droop sparing the forehead. Cranial nerves otherwise intact. 4-5 muscle strength on left upper extremity and left lower extremity. 5 out of 5 on the right. Skin:  Skin is warm, dry and intact. No rash noted. Psychiatric: Mood and  affect are normal.  ____________________________________________   LABS (all labs ordered are listed, but only abnormal results are displayed)  Results for orders placed or performed during the hospital encounter of 02/23/16 (from the past 24 hour(s))    Protime-INR     Status: None   Collection Time: 02/23/16  1:29 PM  Result Value Ref Range   Prothrombin Time 13.7 11.4 - 15.2 seconds   INR 1.05   APTT     Status: None   Collection Time: 02/23/16  1:29 PM  Result Value Ref Range   aPTT 29 24 - 36 seconds  CBC     Status: Abnormal   Collection Time: 02/23/16  1:29 PM  Result Value Ref Range   WBC 6.1 3.8 - 10.6 K/uL   RBC 3.77 (L) 4.40 - 5.90 MIL/uL   Hemoglobin 11.7 (L) 13.0 - 18.0 g/dL   HCT 34.3 (L) 40.0 - 52.0 %   MCV 91.1 80.0 - 100.0 fL   MCH 31.0 26.0 - 34.0 pg   MCHC 34.0 32.0 - 36.0 g/dL   RDW 12.9 11.5 - 14.5 %   Platelets 214 150 - 440 K/uL  Differential     Status: Abnormal   Collection Time: 02/23/16  1:29 PM  Result Value Ref Range   Neutrophils Relative % 88 %   Neutro Abs 5.5 1.4 - 6.5 K/uL   Lymphocytes Relative 6 %   Lymphs Abs 0.3 (L) 1.0 - 3.6 K/uL   Monocytes Relative 5 %   Monocytes Absolute 0.3 0.2 - 1.0 K/uL   Eosinophils Relative 0 %   Eosinophils Absolute 0.0 0 - 0.7 K/uL   Basophils Relative 1 %   Basophils Absolute 0.0 0 - 0.1 K/uL  Comprehensive metabolic panel     Status: Abnormal   Collection Time: 02/23/16  1:29 PM  Result Value Ref Range   Sodium 136 135 - 145 mmol/L   Potassium 4.2 3.5 - 5.1 mmol/L   Chloride 99 (L) 101 - 111 mmol/L   CO2 30 22 - 32 mmol/L   Glucose, Bld 95 65 - 99 mg/dL   BUN 14 6 - 20 mg/dL   Creatinine, Ser 0.72 0.61 - 1.24 mg/dL   Calcium 8.9 8.9 - 10.3 mg/dL   Total Protein 6.8 6.5 - 8.1 g/dL   Albumin 3.8 3.5 - 5.0 g/dL   AST 17 15 - 41 U/L   ALT 14 (L) 17 - 63 U/L   Alkaline Phosphatase 98 38 - 126 U/L   Total Bilirubin 0.8 0.3 - 1.2 mg/dL   GFR calc non Af Amer >60 >60 mL/min   GFR calc Af Amer >60 >60 mL/min   Anion gap 7 5 - 15  Troponin I     Status: None   Collection Time: 02/23/16  1:29 PM  Result Value Ref Range   Troponin I <0.03 <0.03 ng/mL   ____________________________________________  EKG My review and personal interpretation at Time:  13:27   Indication: cva  Rate: 85  Rhythm: sinus Axis: normal Other: poor R wave preogression, hyperacute T waves, no acute ST elevations ____________________________________________  RADIOLOGY  I personally reviewed all radiographic images ordered to evaluate for the above acute complaints and reviewed radiology reports and findings.  These findings were personally discussed with the patient.  Please see medical record for radiology report.  ____________________________________________   PROCEDURES  Procedure(s) performed: none Procedures    Critical Care performed: no ____________________________________________   INITIAL IMPRESSION / ASSESSMENT AND PLAN /  ED COURSE  Pertinent labs & imaging results that were available during my care of the patient were reviewed by me and considered in my medical decision making (see chart for details).  DDX: CVA, sah, iph, sdh, electrolyte abn  Juan Mayo is a 82 y.o. who presents to the ED with Left-sided weakness and deficits as described above. Patient is AFVSS in ED. Exam as above. Given current presentation have considered the above differential. Patient outside when no for TPA. Symptoms do seem to be improving per EMS. CT imaging ordered due to concern for CVA shows evidence of probable acute ischemic infarct. Patient provided IV fluids to maintain perfusion pressure as well as aspirin after swallow screen. We'll order MRI to further characterize the based on his persistent deficit patient will require admission for further evaluation and management.  Have discussed with the patient and available family all diagnostics and treatments performed thus far and all questions were answered to the best of my ability. The patient demonstrates understanding and agreement with plan.   Clinical Course      ____________________________________________   FINAL CLINICAL IMPRESSION(S) / ED DIAGNOSES  Final diagnoses:  Cerebrovascular accident  (CVA), unspecified mechanism ([place])  Hypertension, unspecified type      NEW MEDICATIONS STARTED DURING THIS VISIT:  Current Discharge Medication List       Note:  This document was prepared using Dragon voice recognition software and may include unintentional dictation errors.    Merlyn Lot, MD 02/23/16 [phone]

## 2016-02-23 NOTE — ED Notes (Signed)
Patient transported to CT 

## 2016-02-23 NOTE — ED Triage Notes (Signed)
Pt to ED via EMS from home c/o stroke like symptoms starting today.  Per EMS patient LNW yesterday around 1800 when sister talked to him over the phone, brother saw patient around 1500, patient states woke up around 0430 this morning with symptoms of confusion, left sided weakness and facial droop, right sided gaze.  Patient presents with (+) sensation bilaterally, (+) movement and strength x4 extremities slight deficit to left hand strength, asymetrical left face, slurred speech, and right gaze.  Pt A&Ox4, chest rise even and unlabored.

## 2016-02-23 NOTE — ED Notes (Signed)
Patient transported to MRI 

## 2016-02-24 DIAGNOSIS — I63311 Cerebral infarction due to thrombosis of right middle cerebral artery: Secondary | ICD-10-CM

## 2016-02-24 DIAGNOSIS — I10 Essential (primary) hypertension: Secondary | ICD-10-CM | POA: Diagnosis not present

## 2016-02-24 DIAGNOSIS — I639 Cerebral infarction, unspecified: Secondary | ICD-10-CM | POA: Diagnosis not present

## 2016-02-24 LAB — URINALYSIS COMPLETE WITH MICROSCOPIC (ARMC ONLY)
BILIRUBIN URINE: NEGATIVE
Bacteria, UA: NONE SEEN
Glucose, UA: NEGATIVE mg/dL
HGB URINE DIPSTICK: NEGATIVE
LEUKOCYTES UA: NEGATIVE
Nitrite: NEGATIVE
PH: 6 (ref 5.0–8.0)
Protein, ur: NEGATIVE mg/dL
Specific Gravity, Urine: 1.014 (ref 1.005–1.030)
Squamous Epithelial / LPF: NONE SEEN

## 2016-02-24 LAB — LIPID PANEL
CHOL/HDL RATIO: 3.4 ratio
CHOLESTEROL: 155 mg/dL (ref 0–200)
HDL: 46 mg/dL (ref >40)
LDL Cholesterol: 100 mg/dL — ABNORMAL HIGH (ref 0–99)
Triglycerides: 46 mg/dL (ref <150)
VLDL: 9 mg/dL (ref 0–40)

## 2016-02-24 LAB — ECHOCARDIOGRAM COMPLETE
Height: 67 in
WEIGHTICAEL: 1902.4 [oz_av]

## 2016-02-24 MED ORDER — ORAL CARE MOUTH RINSE
15.0000 mL | Freq: Two times a day (BID) | OROMUCOSAL | Status: DC
  Administered 2016-02-24 – 2016-02-25 (×3): 15 mL via OROMUCOSAL

## 2016-02-24 NOTE — Evaluation (Signed)
Physical Therapy Evaluation Patient Details Name: Juan Mayo MRN: [id number] DOB: [birth year] Today's Date: 02/24/2016   History of Present Illness  82 yo with right parietal lobe peripheral ischemic stroke.  PMHx:  aortic v replacement, back surgery, R IM nailing femur, prostate CA, neuropathy, CABG, HTN,   Clinical Impression  Pt is up to walk with assistance and noted weakness and instability with fatigue quickly during the transition.  He is up with far more help than usual, and his sister was in to note the differences.  He is going to continue acutely with PT as he is appropriate for daily therapy and will hopefully transition to SNF quickly when medically ready.    Follow Up Recommendations SNF    Equipment Recommendations  None recommended by PT    Recommendations for Other Services       Precautions / Restrictions Precautions Precautions: Fall (telemetry) Restrictions Weight Bearing Restrictions: No      Mobility  Bed Mobility Overal bed mobility: Needs Assistance Bed Mobility: Supine to Sit     Supine to sit: Min assist     General bed mobility comments: mainly assisted to scoot EOB  Transfers Overall transfer level: Needs assistance Equipment used: Rolling walker (2 wheeled);1 person hand held assist Transfers: Sit to/from [facility] Sit to Stand: Min assist Stand pivot transfers: Min assist (dense cues to sequence)       General transfer comment: cued him for sequencing the hand placement and steps to stand  Ambulation/Gait Ambulation/Gait assistance: Min assist Ambulation Distance (Feet): 6 Feet Assistive device: Rolling walker (2 wheeled);1 person hand held assist Gait Pattern/deviations: Step-through pattern;Step-to pattern;Trunk flexed;Wide base of support;Shuffle;Decreased stride length Gait velocity: reduced Gait velocity interpretation: Below normal speed for age/gender General Gait Details: shuffling steps with slow  progression but is in a very confined room space  Stairs            Wheelchair Mobility    Modified Rankin (Stroke Patients Only) Modified Rankin (Stroke Patients Only) Pre-Morbid Rankin Score: Slight disability Modified Rankin: Moderately severe disability     Balance                                             Pertinent Vitals/Pain Pain Assessment: No/denies pain    Home Living Family/patient expects to be discharged to:: Private residence Living Arrangements: Alone Available Help at Discharge: Family (elderly brother) Type of Home: House Home Access: Stairs to enter   Technical brewer of Steps: 6 Home Layout: One level Home Equipment: Walker - 2 wheels      Prior Function Level of Independence: Independent with assistive device(s)         Comments: per sister was just out in lawn picking up pecans this week     Hand Dominance        Extremity/Trunk Assessment   Upper Extremity Assessment: Generalized weakness           Lower Extremity Assessment: Generalized weakness      Cervical / Trunk Assessment: Kyphotic  Communication   Communication: Expressive difficulties  Cognition Arousal/Alertness: Awake/alert Behavior During Therapy: Flat affect Overall Cognitive Status: Impaired/Different from baseline Area of Impairment: Following commands;Safety/judgement;Awareness;Problem solving     Memory: Decreased recall of precautions;Decreased short-term memory Following Commands: Follows one step commands with increased time Safety/Judgement: Decreased awareness of safety;Decreased awareness of deficits Awareness: Intellectual Problem  Solving: Slow processing;Difficulty sequencing;Requires verbal cues      General Comments General comments (skin integrity, edema, etc.): Pt is getting up to the chair with PT and has low endurance today and is troubled by his difficulty managing saliva.  Sister in and seems upset by  this.    Exercises     Assessment/Plan    PT Assessment Patient needs continued PT services  PT Problem List Decreased strength;Decreased range of motion;Decreased activity tolerance;Decreased balance;Decreased mobility;Decreased coordination;Decreased cognition;Decreased safety awareness;Decreased knowledge of use of DME;Decreased knowledge of precautions;Cardiopulmonary status limiting activity          PT Treatment Interventions DME instruction;Gait training;Stair training;Functional mobility training;Therapeutic activities;Therapeutic exercise;Balance training;Neuromuscular re-education;Patient/family education    PT Goals (Current goals can be found in the Care Plan section)  Acute Rehab PT Goals Patient Stated Goal: to get up and get to chair PT Goal Formulation: With patient/family Time For Goal Achievement: 03/09/16 Potential to Achieve Goals: Good    Frequency 7X/week   Barriers to discharge Inaccessible home environment;Decreased caregiver support has multiple steps to house    Co-evaluation               End of Session Equipment Utilized During Treatment: Gait belt Activity Tolerance: Patient limited by fatigue;Patient limited by lethargy Patient left: in chair;with call bell/phone within reach;with chair alarm set;with family/visitor present Nurse Communication: Mobility status         Time: [id number] PT Time Calculation (min) (ACUTE ONLY): 23 min   Charges:   PT Evaluation $PT Eval Moderate Complexity: 1 Procedure PT Treatments $Therapeutic Activity: 8-22 mins   PT G Codes:        Ramond Dial 03-09-16, 1:52 PM    Mee Hives, PT MS Acute Rehab Dept. Number: [place]

## 2016-02-24 NOTE — Evaluation (Addendum)
Clinical/Bedside Swallow Evaluation Patient Details  Name: Juan Mayo MRN: [id number] Date of Birth: 02/27/1922  Today's Date: 02/24/2016 Time: SLP Start Time (ACUTE ONLY): 49 SLP Stop Time (ACUTE ONLY): 1030 SLP Time Calculation (min) (ACUTE ONLY): 75 min  Past Medical History:  Past Medical History:  Diagnosis Date  . Hypercholesteremia   . Neuropathy ([place])   . Prostate cancer [facility])    Past Surgical History:  Past Surgical History:  Procedure Laterality Date  . AORTIC VALVE REPLACEMENT    . BACK SURGERY    . CORONARY ARTERY BYPASS GRAFT    . FRACTURE SURGERY Right   . INTRAMEDULLARY (IM) NAIL INTERTROCHANTERIC Right 03/18/2015   Procedure: INTRAMEDULLARY (IM) NAIL INTERTROCHANTRIC;  Surgeon: [name], MD;  Location: [facility] ORS;  Service: Orthopedics;  Laterality: Right;  . PROSTATECTOMY    . REPLACEMENT TOTAL KNEE BILATERAL     HPI: Pt is a 82 y.o. male with a known history of HTN, HLP and prostate cancer. presents with left sided weakness associated with dysarthria and right sided gaze palsy and left facial droop that a was noted upon awakening from sleep around 4 AM. He was normal last night. The patient denies any syncope, loss of consciousness, focal weakness or incontinence. CT head shows right parietal lobe peripheral CVA. Significant Left oral-facial weakness noted; Dysarthria; anterior/Left drooling noted at rest.      Assessment / Plan / Recommendation Clinical Impression  Pt appeared to present w/ oropharyngeal phase dysphagia w/ s/s of aspiration w/ thin liquids via cup/precautions utilized. Pt appeared to better tolerate trials of Nectar liquids and Purees w/ no immediate s/s of aspiration noted, however, tended to tilt head back slightly to aid A-P transfer(suspected) and noted min lingual protrusion during swallowing intermittently indicating neurophysiological deficits. Pt exhibited oral phase deficits c/b reduced labial seal on Left side of mouth w/ slight-min  leakage w/ po trials intermittently(moreso liquids). Noted slower A-P transfer of bolus material and slight-min oral residue post swallow, but w/ verbal cues to use lingual sweep and f/u swallow, pt was able to clear his mouth. Noted overt coughing w/ thin liquids; better toleration of Nectar consistency liquids and purees w/ no immediate s/s of aspiration though a min wet vocal quality was noted post every few trials of Nectar and puree each - pt was instructed on using a min Head Turn and Look Down to aid swallowing. Again, pt was instructed to use a f/u, effortful swallow to clear fully. Due to pt's increased risk for aspiration at this time, recommend a dysphagia 1 diet w/ Nectar consistency liquids w/ strict aspiration precautions and strategies to include a Head Turn to the Left w/ min Look Down, f/u, dry swallows, and lingual sweeping. NSG instructed to hold po's if pt begins to exhibit increased s/s of aspiration during any oral intake. MD consulted w/ above results and poc and agreed. NSG and family/pt updated; signage posted in room. Recommend challenging pt on his LEFT side as pt appears to present w/ Left Neglect. Pt is favoring his Right side w/ gaze preference but is able to turn head to the Left when verbally cued though it is slow and incomplete. Family educated on this.     Aspiration Risk  Moderate aspiration risk    Diet Recommendation  Dysphagia 1 w/ Nectar liquids via cup; strict aspiration precautions; strategies of Head Turn Left w/ Look Down; Throat Clearing intermittently during/post meal.   Medication Administration: Crushed with puree (as able)    Other  Recommendations Recommended Consults:  (dietician consult) Oral Care Recommendations: Oral care BID;Staff/trained caregiver to provide oral care Other Recommendations: Order thickener from pharmacy;Prohibited food (jello, ice cream, thin soups);Remove water pitcher;Have oral suction available   Follow up Recommendations Skilled  Nursing facility (TBD)      Frequency and Duration min 3x week  2 weeks       Prognosis Prognosis for Safe Diet Advancement: Guarded Barriers to Reach Goals: Severity of deficits      Swallow Study   General Date of Onset: 02/23/16 Type of Study: Bedside Swallow Evaluation Previous Swallow Assessment: none Diet Prior to this Study: Dysphagia 3 (soft);Thin liquids (had trouble w/ breads and some meats; larger pills) Temperature Spikes Noted: No (wbc 6.1) Respiratory Status: Room air History of Recent Intubation: No Behavior/Cognition: Alert;Cooperative;Pleasant mood Oral Cavity Assessment: Dry Oral Care Completed by SLP: Yes Oral Cavity - Dentition: Adequate natural dentition;Missing dentition Vision: Functional for self-feeding Self-Feeding Abilities: Able to feed self;Needs set up;Needs assist Patient Positioning: Upright in bed Baseline Vocal Quality: Normal;Low vocal intensity (gravely) Volitional Cough: Strong (grossly wfl) Volitional Swallow: Able to elicit    Oral/Motor/Sensory Function Overall Oral Motor/Sensory Function: Moderate impairment Facial ROM: Reduced left Facial Symmetry: Abnormal symmetry left Facial Strength: Reduced left Facial Sensation: Reduced left Lingual ROM: Reduced left Lingual Symmetry: Abnormal symmetry left Lingual Strength: Suspected CN XII (hypoglossal) dysfunction;Reduced Lingual Sensation: Reduced;Suspected CN VII (facial) dysfunction-anterior 2/3 tongue Velum: Impaired left (slight-min) Mandible: Within Functional Limits   Ice Chips Ice chips: Impaired Presentation: Spoon (fed; 5 trials) Oral Phase Impairments: Reduced labial seal;Reduced lingual movement/coordination (min) Oral Phase Functional Implications: Left anterior spillage Pharyngeal Phase Impairments:  (noted min lingual protrusion during swallowing) Other Comments: pt tended to tilt head back slightly   Thin Liquid Thin Liquid: Impaired Presentation: Cup;Self Fed (6  trials) Oral Phase Impairments: Reduced labial seal;Reduced lingual movement/coordination Oral Phase Functional Implications: Left anterior spillage Pharyngeal  Phase Impairments: Cough - Immediate (x2; noted min lingual protrusion during swallowing)    Nectar Thick Nectar Thick Liquid: Impaired Presentation: Cup;Self Fed (~2 ozs of trials) Oral Phase Impairments: Reduced labial seal;Reduced lingual movement/coordination Oral phase functional implications: Prolonged oral transit;Oral residue (slight) Pharyngeal Phase Impairments: Wet Vocal Quality (x1; noted min lingual protrusion during swallowing x1) Other Comments: tended to tilt head back slightly to aid A-P transfer(suspected)   Honey Thick Honey Thick Liquid: Not tested   Puree Puree: Impaired Presentation: Spoon (fed; 6 trials) Oral Phase Impairments: Reduced labial seal;Reduced lingual movement/coordination Oral Phase Functional Implications: Prolonged oral transit;Oral residue (slight-min) Pharyngeal Phase Impairments: Wet Vocal Quality (x1; noted min lingual protrusion during swallowing) Other Comments: tended to tilt head back slightly to aid A-P transfer(suspected)   Solid   GO   Solid: Not tested         Orinda Kenner, MS, CCC-SLP  Kimala Horne 02/24/2016,10:58 AM

## 2016-02-24 NOTE — Consult Note (Signed)
Reason for Consult:L sided weakness  Referring Physician: Dr. Bridgett Larsson   CC: L sided weakness   HPI: Juan Mayo is an 82 y.o. male with a known history of HTN, HLP and prostate cancer presents with left sided weakness associated with dysarthria and right forsed gaze and left facial droop that a was noted upon awakening from sleep around 4 AM. He was normal night prior The patient denies any syncope, loss of consciousness, focal weakness or incontinence. CT head shows right parietal lobe peripheral ischemic stroke. Pt lives alone at home by himself.    Past Medical History:  Diagnosis Date  . Hypercholesteremia   . Neuropathy ([place])   . Prostate cancer [facility])     Past Surgical History:  Procedure Laterality Date  . AORTIC VALVE REPLACEMENT    . BACK SURGERY    . CORONARY ARTERY BYPASS GRAFT    . FRACTURE SURGERY Right   . INTRAMEDULLARY (IM) NAIL INTERTROCHANTERIC Right 03/18/2015   Procedure: INTRAMEDULLARY (IM) NAIL INTERTROCHANTRIC;  Surgeon: [name], MD;  Location: [facility] ORS;  Service: Orthopedics;  Laterality: Right;  . PROSTATECTOMY    . REPLACEMENT TOTAL KNEE BILATERAL      Family History  Problem Relation Age of Onset  . Hypertension Other     Social History:  reports that he has quit smoking. He has never used smokeless tobacco. He reports that he does not drink alcohol or use drugs.  No Known Allergies  Medications: I have reviewed the patient's current medications.  ROS: History obtained from sister at bedside  General ROS: negative for - chills, fatigue, fever, night sweats, weight gain or weight loss Psychological ROS: negative for - behavioral disorder, hallucinations, memory difficulties, mood swings or suicidal ideation Ophthalmic ROS: negative for - blurry vision, double vision, eye pain or loss of vision ENT ROS: negative for - epistaxis, nasal discharge, oral lesions, sore throat, tinnitus or vertigo Allergy and Immunology ROS: negative for - hives or  itchy/watery eyes Hematological and Lymphatic ROS: negative for - bleeding problems, bruising or swollen lymph nodes Endocrine ROS: negative for - galactorrhea, hair pattern changes, polydipsia/polyuria or temperature intolerance Respiratory ROS: negative for - cough, hemoptysis, shortness of breath or wheezing Cardiovascular ROS: negative for - chest pain, dyspnea on exertion, edema or irregular heartbeat Gastrointestinal ROS: negative for - abdominal pain, diarrhea, hematemesis, nausea/vomiting or stool incontinence Genito-Urinary ROS: negative for - dysuria, hematuria, incontinence or urinary frequency/urgency Musculoskeletal ROS: negative for - joint swelling or muscular weakness Neurological ROS: as noted in HPI Dermatological ROS: negative for rash and skin lesion changes  Physical Examination: Blood pressure (!) 122/58, pulse 76, temperature 97.3 F (36.3 C), temperature source Oral, resp. rate 16, height 5\' 7"  (1.702 m), weight 53.9 kg (118 lb 14.4 oz), SpO2 98 %.  HEENT-  Normocephalic, no lesions, without obvious abnormality.  Normal external eye and conjunctiva.  Normal TM's bilaterally.  Normal auditory canals and external ears. Normal external nose, mucus membranes and septum.  Normal pharynx. Cardiovascular- regular rate and rhythm, S1, S2 normal, no murmur, click, rub or gallop, pulses palpable throughout   Lungs- chest clear, no wheezing, rales, normal symmetric air entry, Heart exam - S1, S2 normal, no murmur, no gallop, rate regular Abdomen- soft, non-tender; bowel sounds normal; no masses,  no organomegaly Extremities- less then 2 second capillary refill Lymph-no adenopathy palpable Musculoskeletal-no joint tenderness, deformity or swelling Skin-warm and dry, no hyperpigmentation, vitiligo, or suspicious lesions  Neurological Examination Mental Status: Alert, oriented, Dysarthria  Cranial  Nerves: II: Discs flat bilaterally; decreased visual fields on L side  III,IV,  VI: ptosis not present, extra-ocular motions intact bilaterally V,VII:L facial droop  VIII: hearing normal bilaterally IX,X: gag reflex present XI: bilateral shoulder shrug XII: midline tongue extension Motor: Right : Upper extremity   5/5    Left:     Upper extremity   4/5  Lower extremity   5/5     Lower extremity   4/5 Tone and bulk:normal tone throughout; no atrophy noted Sensory: Pinprick and light touch intact throughout, bilaterally Deep Tendon Reflexes: 1+ and symmetric throughout Plantars: Right: downgoing   Left: downgoing Cerebellar: normal finger-to-nose, normal rapid alternating movements and normal heel-to-shin test Gait: not tested      Laboratory Studies:   Basic Metabolic Panel:  Recent Labs Lab 02/23/16 1329  NA 136  K 4.2  CL 99*  CO2 30  GLUCOSE 95  BUN 14  CREATININE 0.72  CALCIUM 8.9    Liver Function Tests:  Recent Labs Lab 02/23/16 1329  AST 17  ALT 14*  ALKPHOS 98  BILITOT 0.8  PROT 6.8  ALBUMIN 3.8   No results for input(s): LIPASE, AMYLASE in the last 168 hours. No results for input(s): AMMONIA in the last 168 hours.  CBC:  Recent Labs Lab 02/23/16 1329  WBC 6.1  NEUTROABS 5.5  HGB 11.7*  HCT 34.3*  MCV 91.1  PLT 214    Cardiac Enzymes:  Recent Labs Lab 02/23/16 1329  TROPONINI <0.03    BNP: Invalid input(s): POCBNP  CBG: No results for input(s): GLUCAP in the last 168 hours.  Microbiology: Results for orders placed or performed during the hospital encounter of 03/18/15  Surgical pcr screen     Status: None   Collection Time: 03/18/15 12:51 PM  Result Value Ref Range Status   MRSA, PCR NEGATIVE NEGATIVE Final   Staphylococcus aureus NEGATIVE NEGATIVE Final    Comment:        The Xpert SA Assay (FDA approved for NASAL specimens in patients over 50 years of age), is one component of a comprehensive surveillance program.  Test performance has been validated by [facility] for patients greater than or  equal to 31 year old. It is not intended to diagnose infection nor to guide or monitor treatment.     Coagulation Studies:  Recent Labs  02/23/16 1329  LABPROT 13.7  INR 1.05    Urinalysis:  Recent Labs Lab 02/24/16 0605  COLORURINE YELLOW*  LABSPEC 1.014  PHURINE 6.0  GLUCOSEU NEGATIVE  HGBUR NEGATIVE  BILIRUBINUR NEGATIVE  KETONESUR 2+*  PROTEINUR NEGATIVE  NITRITE NEGATIVE  LEUKOCYTESUR NEGATIVE    Lipid Panel:     Component Value Date/Time   CHOL 155 02/24/2016 0535   TRIG 46 02/24/2016 0535   HDL 46 02/24/2016 0535   CHOLHDL 3.4 02/24/2016 0535   VLDL 9 02/24/2016 0535   LDLCALC 100 (H) 02/24/2016 0535    HgbA1C: No results found for: HGBA1C  Urine Drug Screen:  No results found for: LABOPIA, COCAINSCRNUR, LABBENZ, AMPHETMU, THCU, LABBARB  Alcohol Level: No results for input(s): ETH in the last 168 hours.  Other results: EKG: normal EKG, normal sinus rhythm, unchanged from previous tracings.  Imaging: Dg Chest 1 View  Result Date: 02/23/2016 CLINICAL DATA:  Facial droop, confusion. EXAM: CHEST 1 VIEW COMPARISON:  None. FINDINGS: The heart size and mediastinal contours are within normal limits. Both lungs are clear. No pneumothorax or pleural effusion is noted. Status post coronary  bypass graft. The visualized skeletal structures are unremarkable. IMPRESSION: No acute cardiopulmonary abnormality seen. Electronically Signed   By: Marijo Conception, M.D.   On: 02/23/2016 14:12   Ct Head Wo Contrast  Result Date: 02/23/2016 CLINICAL DATA:  Confusion, left side weakness, facial droop EXAM: CT HEAD WITHOUT CONTRAST TECHNIQUE: Contiguous axial images were obtained from the base of the skull through the vertex without intravenous contrast. COMPARISON:  None. FINDINGS: Brain: No intracranial hemorrhage, mass effect or midline shift. Axial image 21 and 22 there is subtle area of decreased attenuation in right parietal lobe peripheral. Evolving ischemia/infarct  cannot be excluded. Clinical correlation is necessary. Further correlation with MRI is recommended. Mild cerebral atrophy. Periventricular and patchy subcortical white matter decreased attenuation probable due to chronic small vessel ischemic changes. Vascular: Atherosclerotic calcifications of carotid siphon Skull: No skull fracture is noted. Sinuses/Orbits: No acute findings Other: None IMPRESSION: No intracranial hemorrhage, mass effect or midline shift. Axial image 21 and 22 there is subtle area of decreased attenuation in right parietal lobe peripheral. Evolving ischemia/infarct is highly suspected. Clinical correlation is necessary. Further correlation with MRI is recommended. Electronically Signed   By: Lahoma Crocker M.D.   On: 02/23/2016 14:18   [name] Head Wo Contrast  Result Date: 02/23/2016 CLINICAL DATA:  Confusion, left-sided weakness and facial droop. Right gaze abnormality. EXAM: MRI HEAD WITHOUT CONTRAST MRA HEAD WITHOUT CONTRAST TECHNIQUE: Multiplanar, multiecho pulse sequences of the brain and surrounding structures were obtained without intravenous contrast. Angiographic images of the head were obtained using MRA technique without contrast. COMPARISON:  None. FINDINGS: MRI HEAD FINDINGS Brain: There is diffusion restriction within the right frontal lobe, predominantly along the precentral gyrus, extending into the anterior right parietal lobe and also involving the right middle frontal gyrus. Small amount of diffusion restriction of the anterior insular cortex, along the short gyrus. The midline structures are normal. There is beginning confluent hyperintense T2-weighted signal within the periventricular white matter, most often seen in the setting of chronic microvascular ischemia. No mass lesion or midline shift. No hydrocephalus or extra-axial fluid collection. No age advanced or lobar predominant atrophy. Vascular: Major intracranial arterial and venous sinus flow voids are preserved. No  evidence of chronic microhemorrhage or amyloid angiopathy. Skull and upper cervical spine: The visualized skull base, calvarium, upper cervical spine and extracranial soft tissues are normal. Sinuses/Orbits: No fluid levels or advanced mucosal thickening. No mastoid effusion. Normal orbits. MRA HEAD FINDINGS Despite efforts by the technologist and patient, motion artifact is present on today's examination and could not be eliminated. This reduces the sensitivity and specificity of the study. Intracranial internal carotid arteries: Moderate narrowing of the distal petrous segment and communicating segment of the right ICA. There is fusiform dilatation of the cavernous segment of the left ICA, measuring up to 9 mm. Anterior cerebral arteries: Normal. Middle cerebral arteries: Normal. Posterior communicating arteries: Absent bilaterally. Posterior cerebral arteries: Normal. Basilar artery: Normal. Vertebral arteries: Codominant. Normal. Superior cerebellar arteries: Normal. Anterior inferior cerebellar arteries: Normal. Posterior inferior cerebellar arteries: Normal. IMPRESSION: 1. Acute ischemic infarct of the right frontal lobe involving the precentral gyrus, middle frontal gyrus and anterior insular cortex. No hemorrhage or significant mass effect. 2. Moderate narrowing of the petrous segment and communicating segment of the right internal carotid artery show no on the MRA, though the images are motion degraded and this appearance may be partially artifactual. 3. Dilated cavernous segment of the left internal carotid artery cavernous segment, measuring up to 9 mm,  suggesting dolichoectasia. As above, this may be exaggerated by motion artifact. Electronically Signed   By: Ulyses Jarred M.D.   On: 02/23/2016 16:56   Mr Brain Wo Contrast  Result Date: 02/23/2016 CLINICAL DATA:  Confusion, left-sided weakness and facial droop. Right gaze abnormality. EXAM: MRI HEAD WITHOUT CONTRAST MRA HEAD WITHOUT CONTRAST  TECHNIQUE: Multiplanar, multiecho pulse sequences of the brain and surrounding structures were obtained without intravenous contrast. Angiographic images of the head were obtained using MRA technique without contrast. COMPARISON:  None. FINDINGS: MRI HEAD FINDINGS Brain: There is diffusion restriction within the right frontal lobe, predominantly along the precentral gyrus, extending into the anterior right parietal lobe and also involving the right middle frontal gyrus. Small amount of diffusion restriction of the anterior insular cortex, along the short gyrus. The midline structures are normal. There is beginning confluent hyperintense T2-weighted signal within the periventricular white matter, most often seen in the setting of chronic microvascular ischemia. No mass lesion or midline shift. No hydrocephalus or extra-axial fluid collection. No age advanced or lobar predominant atrophy. Vascular: Major intracranial arterial and venous sinus flow voids are preserved. No evidence of chronic microhemorrhage or amyloid angiopathy. Skull and upper cervical spine: The visualized skull base, calvarium, upper cervical spine and extracranial soft tissues are normal. Sinuses/Orbits: No fluid levels or advanced mucosal thickening. No mastoid effusion. Normal orbits. MRA HEAD FINDINGS Despite efforts by the technologist and patient, motion artifact is present on today's examination and could not be eliminated. This reduces the sensitivity and specificity of the study. Intracranial internal carotid arteries: Moderate narrowing of the distal petrous segment and communicating segment of the right ICA. There is fusiform dilatation of the cavernous segment of the left ICA, measuring up to 9 mm. Anterior cerebral arteries: Normal. Middle cerebral arteries: Normal. Posterior communicating arteries: Absent bilaterally. Posterior cerebral arteries: Normal. Basilar artery: Normal. Vertebral arteries: Codominant. Normal. Superior  cerebellar arteries: Normal. Anterior inferior cerebellar arteries: Normal. Posterior inferior cerebellar arteries: Normal. IMPRESSION: 1. Acute ischemic infarct of the right frontal lobe involving the precentral gyrus, middle frontal gyrus and anterior insular cortex. No hemorrhage or significant mass effect. 2. Moderate narrowing of the petrous segment and communicating segment of the right internal carotid artery show no on the MRA, though the images are motion degraded and this appearance may be partially artifactual. 3. Dilated cavernous segment of the left internal carotid artery cavernous segment, measuring up to 9 mm, suggesting dolichoectasia. As above, this may be exaggerated by motion artifact. Electronically Signed   By: Ulyses Jarred M.D.   On: 02/23/2016 16:56   US Carotid Bilateral (at [facility] Only)  Result Date: 02/24/2016 CLINICAL DATA:  82 year old male with acute right frontal lobe infarct EXAM: BILATERAL CAROTID DUPLEX ULTRASOUND TECHNIQUE: Pearline Cables scale imaging, color Doppler and duplex ultrasound were performed of bilateral carotid and vertebral arteries in the neck. COMPARISON:  Brain MRI 02/23/2016 FINDINGS: Criteria: Quantification of carotid stenosis is based on velocity parameters that correlate the residual internal carotid diameter with NASCET-based stenosis levels, using the diameter of the distal internal carotid lumen as the denominator for stenosis measurement. The following velocity measurements were obtained: RIGHT ICA:  95/12 cm/sec CCA:  123456 cm/sec SYSTOLIC ICA/CCA RATIO:  1.0 DIASTOLIC ICA/CCA RATIO:  1.6 ECA:  145 cm/sec LEFT ICA:  117/25 cm/sec CCA:  123456 cm/sec SYSTOLIC ICA/CCA RATIO:  1.4 DIASTOLIC ICA/CCA RATIO:  2.4 ECA:  188 cm/sec RIGHT CAROTID ARTERY: Heterogeneous atherosclerotic plaque in the distal common carotid artery and proximal internal  carotid artery. By peak systolic velocity criteria the estimated stenosis remains less than 50%. RIGHT VERTEBRAL  ARTERY:  Patent with antegrade flow. LEFT CAROTID ARTERY: Mild heterogeneous atherosclerotic plaque in the distal common carotid and proximal internal carotid arteries. The peak systolic velocity criteria the estimated stenosis remains less than 50%. LEFT VERTEBRAL ARTERY:  Patent with normal antegrade flow. Other: 1.2 x 0.9 x 1.0 cm mixed cystic and solid nodule in the left gland with internal colloid artifact. This has an appearance most consistent with a benign colloid nodule. No further imaging or follow-up is required. IMPRESSION: 1. Mild (1-49%) stenosis proximal right internal carotid artery secondary to heterogenous atherosclerotic plaque. 2. Mild (1-49%) stenosis proximal left internal carotid artery secondary to heterogenous atherosclerotic plaque. 3. Vertebral arteries are patent with antegrade flow. Signed, Criselda Peaches, MD [facility] Electronically Signed   By: Jacqulynn Cadet M.D.   On: 02/24/2016 08:11     Assessment/Plan:  82 y.o. male with a known history of HTN, hypereledemia and prostate cancer presents with left sided weakness associated with dysarthria and right forsed gaze and left facial droop that a was noted upon awakening from sleep around 4 AM. He was normal night prior The patient denies any syncope, loss of consciousness, focal weakness or incontinence. CT head shows right parietal lobe peripheral ischemic stroke. Pt lives alone at home by himself.     Stroke risk factors: HTN, hypereledemia and prostate cancer causing hypercoagulable state.  Pt was on ASA 81 at home.   MRI brain R frontal lobe infarct   Plan: Current symptoms are explained by L frontal stroke including inability to cross eyes past midline to L side Needs Pt/OT Appreciate speech evaluation, he appears to have delayed swallow  Made it clear to sister and pt that he can't go back home and live by himself He is not to drive as loss of L  Periferal visual field Increase ASA to 325 daily if able to swallow otherwise rectal Appears to be sinus rhythm with occasional PVCs  02/24/2016, 12:39 PM

## 2016-02-24 NOTE — Progress Notes (Signed)
[place] at [facility]                                                                                                                                                                                  Patient Demographics   Juan Mayo, is a 82 y.o. male, DOB - 12-05-1921, [id number]  Admit date - 02/23/2016   Admitting Physician Demetrios Loll, MD  Outpatient Primary MD for the patient is FITZGERALD, DAVID Mamie Nick, MD   LOS - 1  Subjective: Patient admitted with CVA his left-sided weakness is improved his speech is improved. He is having trouble with left-sided eye    Review of Systems:   CONSTITUTIONAL: No documented fever. No fatigue, weakness. No weight gain, no weight loss.  EYES: No blurry or double vision. Positive visual loss ENT: No tinnitus. No postnasal drip. No redness of the oropharynx.  RESPIRATORY: No cough, no wheeze, no hemoptysis. No dyspnea.  CARDIOVASCULAR: No chest pain. No orthopnea. No palpitations. No syncope.  GASTROINTESTINAL: No nausea, no vomiting or diarrhea. No abdominal pain. No melena or hematochezia.  GENITOURINARY: No dysuria or hematuria.  ENDOCRINE: No polyuria or nocturia. No heat or cold intolerance.  HEMATOLOGY: No anemia. No bruising. No bleeding.  INTEGUMENTARY: No rashes. No lesions.  MUSCULOSKELETAL: No arthritis. No swelling. No gout.  NEUROLOGIC: Left upper extremity weakness and difficulty with vision  PSYCHIATRIC: No anxiety. No insomnia. No ADD.    Vitals:   Vitals:   02/24/16 0217 02/24/16 0353 02/24/16 0559 02/24/16 0756  BP: (!) 139/58 140/63 (!) 122/50 (!) 122/58  Pulse: 70 72 68 76  Resp: (!) 22 20 20 16   Temp: 97.8 F (36.6 C) 98.6 F (37 C)  97.3 F (36.3 C)  TempSrc: Oral Oral  Oral  SpO2: 100% 99% 100% 98%  Weight:      Height:        Wt Readings from Last 3 Encounters:  02/23/16 118 lb 14.4 oz (53.9 kg)  05/05/15 125 lb (56.7 kg)  03/21/15 141 lb 6.4 oz (64.1 kg)     Intake/Output  Summary (Last 24 hours) at 02/24/16 1231 Last data filed at 02/24/16 0315  Gross per 24 hour  Intake           498.33 ml  Output             1100 ml  Net          -601.67 ml    Physical Exam:   GENERAL: Pleasant-appearing in no apparent distress.  HEAD, EYES, EARS, NOSE AND THROAT: Atraumatic, normocephalic. Right sided eye droop Pupils equal and reactive to light. Sclerae anicteric. No conjunctival injection. No  oro-pharyngeal erythema.  NECK: Supple. There is no jugular venous distention. No bruits, no lymphadenopathy, no thyromegaly.  HEART: Regular rate and rhythm,. No murmurs, no rubs, no clicks.  LUNGS: Clear to auscultation bilaterally. No rales or rhonchi. No wheezes.  ABDOMEN: Soft, flat, nontender, nondistended. Has good bowel sounds. No hepatosplenomegaly appreciated.  EXTREMITIES: No evidence of any cyanosis, clubbing, or peripheral edema.  +2 pedal and radial pulses bilaterally.  NEUROLOGIC: The patient is alert, awake, and oriented x3 left upper extremity weakness  SKIN: Moist and warm with no rashes appreciated.  Psych: Not anxious, depressed LN: No inguinal LN enlargement    Antibiotics   Anti-infectives    None      Medications   Scheduled Meds: . aspirin  300 mg Rectal Daily   Or  . aspirin  325 mg Oral Daily  . atorvastatin  40 mg Oral q1800  . enoxaparin (LOVENOX) injection  40 mg Subcutaneous Q24H  . mouth rinse  15 mL Mouth Rinse BID   Continuous Infusions: . sodium chloride 50 mL/hr at 02/24/16 0300   PRN Meds:.senna-docusate   Data Review:   Micro Results No results found for this or any previous visit (from the past 240 hour(s)).  Radiology Reports Dg Chest 1 View  Result Date: 02/23/2016 CLINICAL DATA:  Facial droop, confusion. EXAM: CHEST 1 VIEW COMPARISON:  None. FINDINGS: The heart size and mediastinal contours are within normal limits. Both lungs are clear. No pneumothorax or pleural effusion is noted. Status post coronary bypass  graft. The visualized skeletal structures are unremarkable. IMPRESSION: No acute cardiopulmonary abnormality seen. Electronically Signed   By: Marijo Conception, M.D.   On: 02/23/2016 14:12   Ct Head Wo Contrast  Result Date: 02/23/2016 CLINICAL DATA:  Confusion, left side weakness, facial droop EXAM: CT HEAD WITHOUT CONTRAST TECHNIQUE: Contiguous axial images were obtained from the base of the skull through the vertex without intravenous contrast. COMPARISON:  None. FINDINGS: Brain: No intracranial hemorrhage, mass effect or midline shift. Axial image 21 and 22 there is subtle area of decreased attenuation in right parietal lobe peripheral. Evolving ischemia/infarct cannot be excluded. Clinical correlation is necessary. Further correlation with MRI is recommended. Mild cerebral atrophy. Periventricular and patchy subcortical white matter decreased attenuation probable due to chronic small vessel ischemic changes. Vascular: Atherosclerotic calcifications of carotid siphon Skull: No skull fracture is noted. Sinuses/Orbits: No acute findings Other: None IMPRESSION: No intracranial hemorrhage, mass effect or midline shift. Axial image 21 and 22 there is subtle area of decreased attenuation in right parietal lobe peripheral. Evolving ischemia/infarct is highly suspected. Clinical correlation is necessary. Further correlation with MRI is recommended. Electronically Signed   By: Lahoma Crocker M.D.   On: 02/23/2016 14:18   [name] Head Wo Contrast  Result Date: 02/23/2016 CLINICAL DATA:  Confusion, left-sided weakness and facial droop. Right gaze abnormality. EXAM: MRI HEAD WITHOUT CONTRAST MRA HEAD WITHOUT CONTRAST TECHNIQUE: Multiplanar, multiecho pulse sequences of the brain and surrounding structures were obtained without intravenous contrast. Angiographic images of the head were obtained using MRA technique without contrast. COMPARISON:  None. FINDINGS: MRI HEAD FINDINGS Brain: There is diffusion restriction  within the right frontal lobe, predominantly along the precentral gyrus, extending into the anterior right parietal lobe and also involving the right middle frontal gyrus. Small amount of diffusion restriction of the anterior insular cortex, along the short gyrus. The midline structures are normal. There is beginning confluent hyperintense T2-weighted signal within the periventricular white matter, most often seen  in the setting of chronic microvascular ischemia. No mass lesion or midline shift. No hydrocephalus or extra-axial fluid collection. No age advanced or lobar predominant atrophy. Vascular: Major intracranial arterial and venous sinus flow voids are preserved. No evidence of chronic microhemorrhage or amyloid angiopathy. Skull and upper cervical spine: The visualized skull base, calvarium, upper cervical spine and extracranial soft tissues are normal. Sinuses/Orbits: No fluid levels or advanced mucosal thickening. No mastoid effusion. Normal orbits. MRA HEAD FINDINGS Despite efforts by the technologist and patient, motion artifact is present on today's examination and could not be eliminated. This reduces the sensitivity and specificity of the study. Intracranial internal carotid arteries: Moderate narrowing of the distal petrous segment and communicating segment of the right ICA. There is fusiform dilatation of the cavernous segment of the left ICA, measuring up to 9 mm. Anterior cerebral arteries: Normal. Middle cerebral arteries: Normal. Posterior communicating arteries: Absent bilaterally. Posterior cerebral arteries: Normal. Basilar artery: Normal. Vertebral arteries: Codominant. Normal. Superior cerebellar arteries: Normal. Anterior inferior cerebellar arteries: Normal. Posterior inferior cerebellar arteries: Normal. IMPRESSION: 1. Acute ischemic infarct of the right frontal lobe involving the precentral gyrus, middle frontal gyrus and anterior insular cortex. No hemorrhage or significant mass effect.  2. Moderate narrowing of the petrous segment and communicating segment of the right internal carotid artery show no on the MRA, though the images are motion degraded and this appearance may be partially artifactual. 3. Dilated cavernous segment of the left internal carotid artery cavernous segment, measuring up to 9 mm, suggesting dolichoectasia. As above, this may be exaggerated by motion artifact. Electronically Signed   By: Ulyses Jarred M.D.   On: 02/23/2016 16:56   Mr Brain Wo Contrast  Result Date: 02/23/2016 CLINICAL DATA:  Confusion, left-sided weakness and facial droop. Right gaze abnormality. EXAM: MRI HEAD WITHOUT CONTRAST MRA HEAD WITHOUT CONTRAST TECHNIQUE: Multiplanar, multiecho pulse sequences of the brain and surrounding structures were obtained without intravenous contrast. Angiographic images of the head were obtained using MRA technique without contrast. COMPARISON:  None. FINDINGS: MRI HEAD FINDINGS Brain: There is diffusion restriction within the right frontal lobe, predominantly along the precentral gyrus, extending into the anterior right parietal lobe and also involving the right middle frontal gyrus. Small amount of diffusion restriction of the anterior insular cortex, along the short gyrus. The midline structures are normal. There is beginning confluent hyperintense T2-weighted signal within the periventricular white matter, most often seen in the setting of chronic microvascular ischemia. No mass lesion or midline shift. No hydrocephalus or extra-axial fluid collection. No age advanced or lobar predominant atrophy. Vascular: Major intracranial arterial and venous sinus flow voids are preserved. No evidence of chronic microhemorrhage or amyloid angiopathy. Skull and upper cervical spine: The visualized skull base, calvarium, upper cervical spine and extracranial soft tissues are normal. Sinuses/Orbits: No fluid levels or advanced mucosal thickening. No mastoid effusion. Normal orbits.  MRA HEAD FINDINGS Despite efforts by the technologist and patient, motion artifact is present on today's examination and could not be eliminated. This reduces the sensitivity and specificity of the study. Intracranial internal carotid arteries: Moderate narrowing of the distal petrous segment and communicating segment of the right ICA. There is fusiform dilatation of the cavernous segment of the left ICA, measuring up to 9 mm. Anterior cerebral arteries: Normal. Middle cerebral arteries: Normal. Posterior communicating arteries: Absent bilaterally. Posterior cerebral arteries: Normal. Basilar artery: Normal. Vertebral arteries: Codominant. Normal. Superior cerebellar arteries: Normal. Anterior inferior cerebellar arteries: Normal. Posterior inferior cerebellar arteries: Normal. IMPRESSION: 1. Acute  ischemic infarct of the right frontal lobe involving the precentral gyrus, middle frontal gyrus and anterior insular cortex. No hemorrhage or significant mass effect. 2. Moderate narrowing of the petrous segment and communicating segment of the right internal carotid artery show no on the MRA, though the images are motion degraded and this appearance may be partially artifactual. 3. Dilated cavernous segment of the left internal carotid artery cavernous segment, measuring up to 9 mm, suggesting dolichoectasia. As above, this may be exaggerated by motion artifact. Electronically Signed   By: Ulyses Jarred M.D.   On: 02/23/2016 16:56   US Carotid Bilateral (at [facility] And [facility] Only)  Result Date: 02/24/2016 CLINICAL DATA:  82 year old male with acute right frontal lobe infarct EXAM: BILATERAL CAROTID DUPLEX ULTRASOUND TECHNIQUE: Pearline Cables scale imaging, color Doppler and duplex ultrasound were performed of bilateral carotid and vertebral arteries in the neck. COMPARISON:  Brain MRI 02/23/2016 FINDINGS: Criteria: Quantification of carotid stenosis is based on velocity parameters that correlate the residual internal carotid  diameter with NASCET-based stenosis levels, using the diameter of the distal internal carotid lumen as the denominator for stenosis measurement. The following velocity measurements were obtained: RIGHT ICA:  95/12 cm/sec CCA:  123456 cm/sec SYSTOLIC ICA/CCA RATIO:  1.0 DIASTOLIC ICA/CCA RATIO:  1.6 ECA:  145 cm/sec LEFT ICA:  117/25 cm/sec CCA:  123456 cm/sec SYSTOLIC ICA/CCA RATIO:  1.4 DIASTOLIC ICA/CCA RATIO:  2.4 ECA:  188 cm/sec RIGHT CAROTID ARTERY: Heterogeneous atherosclerotic plaque in the distal common carotid artery and proximal internal carotid artery. By peak systolic velocity criteria the estimated stenosis remains less than 50%. RIGHT VERTEBRAL ARTERY:  Patent with antegrade flow. LEFT CAROTID ARTERY: Mild heterogeneous atherosclerotic plaque in the distal common carotid and proximal internal carotid arteries. The peak systolic velocity criteria the estimated stenosis remains less than 50%. LEFT VERTEBRAL ARTERY:  Patent with normal antegrade flow. Other: 1.2 x 0.9 x 1.0 cm mixed cystic and solid nodule in the left gland with internal colloid artifact. This has an appearance most consistent with a benign colloid nodule. No further imaging or follow-up is required. IMPRESSION: 1. Mild (1-49%) stenosis proximal right internal carotid artery secondary to heterogenous atherosclerotic plaque. 2. Mild (1-49%) stenosis proximal left internal carotid artery secondary to heterogenous atherosclerotic plaque. 3. Vertebral arteries are patent with antegrade flow. Signed, Criselda Peaches, MD [facility] Radiology Electronically Signed   By: Jacqulynn Cadet M.D.   On: 02/24/2016 08:11     CBC  Recent Labs Lab 02/23/16 1329  WBC 6.1  HGB 11.7*  HCT 34.3*  PLT 214  MCV 91.1  MCH 31.0  MCHC 34.0  RDW 12.9  LYMPHSABS 0.3*  MONOABS 0.3  EOSABS 0.0  BASOSABS 0.0    Chemistries   Recent Labs Lab 02/23/16 1329  NA 136  K 4.2  CL 99*  CO2 30   GLUCOSE 95  BUN 14  CREATININE 0.72  CALCIUM 8.9  AST 17  ALT 14*  ALKPHOS 98  BILITOT 0.8   ------------------------------------------------------------------------------------------------------------------ estimated creatinine clearance is 44.9 mL/min (by C-G formula based on SCr of 0.72 mg/dL). ------------------------------------------------------------------------------------------------------------------ No results for input(s): HGBA1C in the last 72 hours. ------------------------------------------------------------------------------------------------------------------  Recent Labs  02/24/16 0535  CHOL 155  HDL 46  LDLCALC 100*  TRIG 46  CHOLHDL 3.4   ------------------------------------------------------------------------------------------------------------------ No results for input(s): TSH, T4TOTAL, T3FREE, THYROIDAB in the last 72 hours.  Invalid input(s): FREET3 ------------------------------------------------------------------------------------------------------------------ No results for input(s): VITAMINB12, FOLATE, FERRITIN, TIBC, IRON, RETICCTPCT in the  last 72 hours.  Coagulation profile  Recent Labs Lab 02/23/16 1329  INR 1.05    No results for input(s): DDIMER in the last 72 hours.  Cardiac Enzymes  Recent Labs Lab 02/23/16 1329  TROPONINI <0.03   ------------------------------------------------------------------------------------------------------------------ Invalid input(s): POCBNP    Assessment & Plan  Patient is a 82 year old with acute CVA 1. Acute CVA.  MRI confirms right frontal CVA Continue therapy with aspirin High-dose statins Echo pending  2. HTN. Resume loppresor  3. HLP continue lipitor   4. miscL lovenox for dvt proph.  All the records are reviewed and case discussed with ED provider.     Code Status Orders        Start     Ordered   02/23/16 1557  Full code  Continuous     02/23/16 1556    Code  Status History    Date Active Date Inactive Code Status Order ID Comments User Context   03/18/2015  5:58 PM 03/21/2015  5:03 PM Full Code [id number]  [name], MD Inpatient   03/18/2015  8:20 AM 03/18/2015  5:58 PM Full Code [id number]  Harrie Foreman, MD Inpatient   03/18/2015  5:55 AM 03/18/2015  8:20 AM Full Code [id number]  [name], MD ED    Advance Directive Documentation   Flowsheet Row Most Recent Value  Type of Advance Directive  Healthcare Power of Attorney  Pre-existing out of facility DNR order (yellow form or pink MOST form)  No data  "MOST" Form in Place?  No data     CODE STATUS discussed with patient encouraged DO NOT RESUSCITATE status he will discuss with the rest of the family regarding his CODE STATUS      Consults neuro   DVT Prophylaxis  Lovenox   Lab Results  Component Value Date   PLT 214 02/23/2016     Time Spent in minutes  20min  Greater than 50% of time spent in care coordination and counseling patient regarding the condition and plan of care.   Dustin Flock M.D on 02/24/2016 at 12:31 PM  Between 7am to 6pm - Pager - [phone]  After 6pm go to [email or web address] - password [email or web address] [facility]   Office  [phone]

## 2016-02-25 DIAGNOSIS — R41841 Cognitive communication deficit: Secondary | ICD-10-CM | POA: Diagnosis not present

## 2016-02-25 DIAGNOSIS — Z5189 Encounter for other specified aftercare: Secondary | ICD-10-CM | POA: Diagnosis not present

## 2016-02-25 DIAGNOSIS — E784 Other hyperlipidemia: Secondary | ICD-10-CM | POA: Diagnosis not present

## 2016-02-25 DIAGNOSIS — I639 Cerebral infarction, unspecified: Secondary | ICD-10-CM | POA: Diagnosis not present

## 2016-02-25 DIAGNOSIS — E785 Hyperlipidemia, unspecified: Secondary | ICD-10-CM | POA: Diagnosis not present

## 2016-02-25 DIAGNOSIS — I679 Cerebrovascular disease, unspecified: Secondary | ICD-10-CM | POA: Diagnosis not present

## 2016-02-25 DIAGNOSIS — G464 Cerebellar stroke syndrome: Secondary | ICD-10-CM | POA: Diagnosis not present

## 2016-02-25 DIAGNOSIS — M6281 Muscle weakness (generalized): Secondary | ICD-10-CM | POA: Diagnosis not present

## 2016-02-25 DIAGNOSIS — K5909 Other constipation: Secondary | ICD-10-CM | POA: Diagnosis not present

## 2016-02-25 DIAGNOSIS — R2689 Other abnormalities of gait and mobility: Secondary | ICD-10-CM | POA: Diagnosis not present

## 2016-02-25 DIAGNOSIS — R1312 Dysphagia, oropharyngeal phase: Secondary | ICD-10-CM | POA: Diagnosis not present

## 2016-02-25 DIAGNOSIS — I10 Essential (primary) hypertension: Secondary | ICD-10-CM | POA: Diagnosis not present

## 2016-02-25 DIAGNOSIS — Z7401 Bed confinement status: Secondary | ICD-10-CM | POA: Diagnosis not present

## 2016-02-25 LAB — HEMOGLOBIN A1C
Hgb A1c MFr Bld: 4.6 % — ABNORMAL LOW (ref 4.8–5.6)
MEAN PLASMA GLUCOSE: 85 mg/dL

## 2016-02-25 MED ORDER — CLOPIDOGREL BISULFATE 75 MG PO TABS: 75.0000 mg | ORAL_TABLET | Freq: Every day | ORAL | 0 refills | Status: AC

## 2016-02-25 MED ORDER — ACETAMINOPHEN 325 MG PO TABS
650.0000 mg | ORAL_TABLET | Freq: Four times a day (QID) | ORAL | Status: DC | PRN
  Administered 2016-02-25: 650 mg via ORAL
  Filled 2016-02-25: qty 2

## 2016-02-25 MED ORDER — CLOPIDOGREL BISULFATE 75 MG PO TABS
75.0000 mg | ORAL_TABLET | Freq: Every day | ORAL | Status: DC
Start: 2016-02-26 — End: 2016-02-25

## 2016-02-25 MED ORDER — SENNOSIDES-DOCUSATE SODIUM 8.6-50 MG PO TABS: 1.0000 | ORAL_TABLET | Freq: Every evening | ORAL | 0 refills | Status: DC | PRN

## 2016-02-25 MED ORDER — ORAL CARE MOUTH RINSE: 15.0000 mL | Freq: Two times a day (BID) | OROMUCOSAL | 0 refills | Status: DC

## 2016-02-25 MED ORDER — ATORVASTATIN CALCIUM 40 MG PO TABS: 40.0000 mg | ORAL_TABLET | Freq: Every day | ORAL | 0 refills | Status: AC

## 2016-02-25 NOTE — Progress Notes (Signed)
Physical Therapy Treatment Patient Details Name: WANE CERRETA MRN: [id number] DOB: 1921/07/03 Today's Date: 02/25/2016    History of Present Illness Pt. is a 82 y.o. male who was admitted to [facility] with a right Parietal Lobe Ischemic CVA. Pt. PMHx includes: aortic valve replacement, RIght IM nailing of the Femur, Prostate CA, Neuropathy, CABG, HTN.    PT Comments    Pt agreeable to PT; reports fatigue. Pt participates in supine bed exercises with assist as required more so on the left. Pt transitions to edge of bed with Min A with brief sitting period; pt complains of fatigue and wishes to return to bed. Pt returns with greater assist for trunk control. No further PT attempted at the time. Pt now with current discharge order to skilled nursing facility.   Follow Up Recommendations  SNF     Equipment Recommendations  None recommended by PT    Recommendations for Other Services       Precautions / Restrictions Precautions Precautions: Fall Restrictions Weight Bearing Restrictions: No    Mobility  Bed Mobility Overal bed mobility: Needs Assistance Bed Mobility: Supine to Sit;Sit to Supine     Supine to sit: Min assist Sit to supine: Mod assist   General bed mobility comments: increased assist to return to bed for control of trunk  Transfers                 General transfer comment: Not tested  Ambulation/Gait                 Stairs            Wheelchair Mobility    Modified Rankin (Stroke Patients Only)       Balance                                    Cognition Arousal/Alertness: Awake/alert Behavior During Therapy: Flat affect Overall Cognitive Status: Impaired/Different from baseline Area of Impairment: Memory;Problem solving;Awareness;Following commands     Memory: Decreased recall of precautions;Decreased short-term memory Following Commands: Follows one step commands inconsistently;Follows one step commands with  increased time     Problem Solving: Slow processing      Exercises General Exercises - Lower Extremity Ankle Circles/Pumps: AAROM;Both;10 reps;Supine Quad Sets: Other (comment) (attempted; difficulty comprehending) Short Arc Quad: AAROM;Both;10 reps;Supine Heel Slides: AAROM;Both;10 reps;Supine Hip ABduction/ADduction: AAROM;Both;10 reps;Supine    General Comments        Pertinent Vitals/Pain Pain Assessment: No/denies pain    Home Living                      Prior Function            PT Goals (current goals can now be found in the care plan section) Progress towards PT goals: Progressing toward goals    Frequency    7X/week      PT Plan Current plan remains appropriate    Co-evaluation             End of Session   Activity Tolerance: Patient limited by fatigue Patient left: in bed;with call bell/phone within reach;with bed alarm set     Time: [id number] PT Time Calculation (min) (ACUTE ONLY): 18 min  Charges:  $Therapeutic Exercise: 8-22 mins                    G Codes:  Larae Grooms, PTA 02/25/2016, 2:18 PM

## 2016-02-25 NOTE — Clinical Social Work Placement (Signed)
   CLINICAL SOCIAL WORK PLACEMENT  NOTE  Date:  02/25/2016  Patient Details  Name: Juan Mayo MRN: [id number] Date of Birth: 10/03/1921  Clinical Social Work is seeking post-discharge placement for this patient at the [place] level of care (*CSW will initial, date and re-position this form in  chart as items are completed):  Yes   Patient/family provided with [place] Work Department's list of facilities offering this level of care within the geographic area requested by the patient (or if unable, by the patient's family).  Yes   Patient/family informed of their freedom to choose among providers that offer the needed level of care, that participate in Medicare, Medicaid or managed care program needed by the patient, have an available bed and are willing to accept the patient.  Yes   Patient/family informed of [place]'s ownership interest in [facility] and [facility], as well as of the fact that they are under no obligation to receive care at these facilities.  PASRR submitted to EDS on       PASRR number received on       Existing PASRR number confirmed on 02/25/16     FL2 transmitted to all facilities in geographic area requested by pt/family on 02/25/16     FL2 transmitted to all facilities within larger geographic area on       Patient informed that his/her managed care company has contracts with or will negotiate with certain facilities, including the following:        Yes   Patient/family informed of bed offers received.  Patient chooses bed at  ([facility] )     Physician recommends and patient chooses bed at      Patient to be transferred to  ([facility] ) on 02/25/16.  Patient to be transferred to facility by  [facility] )     Patient family notified on 02/25/16 of transfer.  Name of family member notified:   (Patient's sister [name] is aware of D/C today. )     PHYSICIAN       Additional Comment:     _______________________________________________ Landen Breeland, Veronia Beets, LCSW 02/25/2016, 2:44 PM

## 2016-02-25 NOTE — Progress Notes (Addendum)
Occupational Therapy Evaluation Patient Details Name: Juan Mayo MRN: [id number] DOB: 11-22-1921 Today's Date: 02/25/2016    History of Present Illness Pt. is a 82 y.o. male who was admitted to [facility] with a right Parietal Lobe Ischemic CVA. Pt. PMHx includes: aortic valve replacement, RIght IM nailing of the Femur, Prostate CA, Neuropathy, CABG, HTN.   Clinical Impression   Pt. Is a 82 y.o. Male who was admitted to [facility] with a right PArietal Lobe, Ischemic CVA.  Pt. presents with left sided weakness, impaired left UE motor control, and coordination skills which hinder his ability to complete ADL and IADL functioning. Pt. could benefit from skilled OT services to work on improving UE strength, and coordination skills to be able to engage the LUE in ADL tasks. Pt. Could benefit from follow OT services at SNF level of care.     Follow Up Recommendations  SNF    Equipment Recommendations       Recommendations for Other Services       Precautions / Restrictions Precautions Precautions: Fall Restrictions Weight Bearing Restrictions: No                                                     ADL Overall ADL's : Needs assistance/impaired Eating/Feeding: Minimal assistance (Is on swallowing precautions.)   Grooming: Minimal assistance               Lower Body Dressing: Maximal assistance                 General ADL Comments: Pt. has has difficulty using his left hand to assist in perfroming light self-care tasks, hand to face patterns, opening items in set-up for ADL tasks.     Vision     Perception     Praxis      Pertinent Vitals/Pain       Hand Dominance     Extremity/Trunk Assessment Upper Extremity Assessment Upper Extremity Assessment: Generalized weakness           Communication     Cognition Arousal/Alertness: Awake/alert Behavior During Therapy: Flat affect Overall Cognitive Status: Impaired/Different from  baseline Area of Impairment: Safety/judgement;Awareness;Problem solving                   General Comments       Exercises       Shoulder Instructions      Home Living                                          Prior Functioning/Environment                   OT Problem List:     OT Treatment/Interventions: Self-care/ADL training;Therapeutic exercise;Therapeutic activities;Patient/family education    OT Goals(Current goals can be found in the care plan section)    OT Frequency: Min 1X/week   Barriers to D/C:            Co-evaluation              End of Session    Activity Tolerance: Patient tolerated treatment well Patient left: in bed;with call bell/phone within reach;with bed alarm set   Time: [id number] OT Time Calculation (min): 25  min Charges:  OT General Charges $OT Visit: 1 Procedure OT Evaluation $OT Eval Moderate Complexity: 1 Procedure G-Codes:    Harrel Carina MS, OTR/L 02/25/2016, 10:15 AM

## 2016-02-25 NOTE — Progress Notes (Signed)
Palliative Medicine consult noted. Due to high referral volume, there may be a delay seeing this patient. Please call the Palliative Medicine Team office at [phone] if recommendations are needed in the interim.  Thank you for inviting [place] to see this patient.   Marjie Skiff Augie Vane, RN, BSN, [facility] 02/25/2016 8:42 AM Cell [phone] 8:00-4:00 Monday-Friday Office [phone]

## 2016-02-25 NOTE — Progress Notes (Signed)
Subjective: Patient tearful today due to conversation with family about results of stroke.  No complaints of new neurological changes.    Objective: Current vital signs: BP (!) 163/61 (BP Location: Right Arm)   Pulse 88   Temp 97.5 F (36.4 C) (Oral)   Resp (!) 22   Ht 5\' 7"  (1.702 m)   Wt 53.9 kg (118 lb 14.4 oz)   SpO2 100%   BMI 18.62 kg/m  Vital signs in last 24 hours: Temp:  [97.5 F (36.4 C)-98.3 F (36.8 C)] 97.5 F (36.4 C) (11/27 0900) Pulse Rate:  [74-88] 88 (11/27 0900) Resp:  [16-22] 22 (11/27 0900) BP: (133-164)/(58-74) 163/61 (11/27 0900) SpO2:  [99 %-100 %] 100 % (11/27 0900)  Intake/Output from previous day: 11/26 0701 - 11/27 0700 In: -  Out: 900 [Urine:900] Intake/Output this shift: Total I/O In: 240 [P.O.:240] Out: -  Nutritional status: DIET - DYS 1 Room service appropriate? Yes with Assist; Fluid consistency: Nectar Thick  Neurologic Exam: Mental Status: Alert, oriented, labile.  Speech fluent but dysarthric.  Able to follow 3 step commands without difficulty. Cranial Nerves: II: Discs flat bilaterally; decreased left visual field III,IV, VI: ptosis not present, unable to move eyes beyond midline to the left V,VII: left facial droop VIII: hearing normal bilaterally IX,X: gag reflex present XI: bilateral shoulder shrug XII: midline tongue extension Motor: Right : Upper extremity   5/5    Left:     Upper extremity   5-/5  Lower extremity   5/5     Lower extremity   5-/5 Tone and bulk:normal tone throughout; no atrophy noted Sensory: Pinprick and light touch intact throughout, bilaterally Deep Tendon Reflexes: 1+ and symmetric throughout   Lab Results: Basic Metabolic Panel:  Recent Labs Lab 02/23/16 1329  NA 136  K 4.2  CL 99*  CO2 30  GLUCOSE 95  BUN 14  CREATININE 0.72  CALCIUM 8.9    Liver Function Tests:  Recent Labs Lab 02/23/16 1329  AST 17  ALT 14*  ALKPHOS 98  BILITOT 0.8  PROT 6.8  ALBUMIN 3.8   No results  for input(s): LIPASE, AMYLASE in the last 168 hours. No results for input(s): AMMONIA in the last 168 hours.  CBC:  Recent Labs Lab 02/23/16 1329  WBC 6.1  NEUTROABS 5.5  HGB 11.7*  HCT 34.3*  MCV 91.1  PLT 214    Cardiac Enzymes:  Recent Labs Lab 02/23/16 1329  TROPONINI <0.03    Lipid Panel:  Recent Labs Lab 02/24/16 0535  CHOL 155  TRIG 46  HDL 46  CHOLHDL 3.4  VLDL 9  LDLCALC 100*    CBG: No results for input(s): GLUCAP in the last 168 hours.  Microbiology: Results for orders placed or performed during the hospital encounter of 03/18/15  Surgical pcr screen     Status: None   Collection Time: 03/18/15 12:51 PM  Result Value Ref Range Status   MRSA, PCR NEGATIVE NEGATIVE Final   Staphylococcus aureus NEGATIVE NEGATIVE Final    Comment:        The Xpert SA Assay (FDA approved for NASAL specimens in patients over 13 years of age), is one component of a comprehensive surveillance program.  Test performance has been validated by [facility] for patients greater than or equal to 26 year old. It is not intended to diagnose infection nor to guide or monitor treatment.     Coagulation Studies:  Recent Labs  02/23/16 1329  LABPROT 13.7  INR 1.05    Imaging: Dg Chest 1 View  Result Date: 02/23/2016 CLINICAL DATA:  Facial droop, confusion. EXAM: CHEST 1 VIEW COMPARISON:  None. FINDINGS: The heart size and mediastinal contours are within normal limits. Both lungs are clear. No pneumothorax or pleural effusion is noted. Status post coronary bypass graft. The visualized skeletal structures are unremarkable. IMPRESSION: No acute cardiopulmonary abnormality seen. Electronically Signed   By: Marijo Conception, M.D.   On: 02/23/2016 14:12   Ct Head Wo Contrast  Result Date: 02/23/2016 CLINICAL DATA:  Confusion, left side weakness, facial droop EXAM: CT HEAD WITHOUT CONTRAST TECHNIQUE: Contiguous axial images were obtained from the base of the skull  through the vertex without intravenous contrast. COMPARISON:  None. FINDINGS: Brain: No intracranial hemorrhage, mass effect or midline shift. Axial image 21 and 22 there is subtle area of decreased attenuation in right parietal lobe peripheral. Evolving ischemia/infarct cannot be excluded. Clinical correlation is necessary. Further correlation with MRI is recommended. Mild cerebral atrophy. Periventricular and patchy subcortical white matter decreased attenuation probable due to chronic small vessel ischemic changes. Vascular: Atherosclerotic calcifications of carotid siphon Skull: No skull fracture is noted. Sinuses/Orbits: No acute findings Other: None IMPRESSION: No intracranial hemorrhage, mass effect or midline shift. Axial image 21 and 22 there is subtle area of decreased attenuation in right parietal lobe peripheral. Evolving ischemia/infarct is highly suspected. Clinical correlation is necessary. Further correlation with MRI is recommended. Electronically Signed   By: Lahoma Crocker M.D.   On: 02/23/2016 14:18   [name] Head Wo Contrast  Result Date: 02/23/2016 CLINICAL DATA:  Confusion, left-sided weakness and facial droop. Right gaze abnormality. EXAM: MRI HEAD WITHOUT CONTRAST MRA HEAD WITHOUT CONTRAST TECHNIQUE: Multiplanar, multiecho pulse sequences of the brain and surrounding structures were obtained without intravenous contrast. Angiographic images of the head were obtained using MRA technique without contrast. COMPARISON:  None. FINDINGS: MRI HEAD FINDINGS Brain: There is diffusion restriction within the right frontal lobe, predominantly along the precentral gyrus, extending into the anterior right parietal lobe and also involving the right middle frontal gyrus. Small amount of diffusion restriction of the anterior insular cortex, along the short gyrus. The midline structures are normal. There is beginning confluent hyperintense T2-weighted signal within the periventricular white matter, most often  seen in the setting of chronic microvascular ischemia. No mass lesion or midline shift. No hydrocephalus or extra-axial fluid collection. No age advanced or lobar predominant atrophy. Vascular: Major intracranial arterial and venous sinus flow voids are preserved. No evidence of chronic microhemorrhage or amyloid angiopathy. Skull and upper cervical spine: The visualized skull base, calvarium, upper cervical spine and extracranial soft tissues are normal. Sinuses/Orbits: No fluid levels or advanced mucosal thickening. No mastoid effusion. Normal orbits. MRA HEAD FINDINGS Despite efforts by the technologist and patient, motion artifact is present on today's examination and could not be eliminated. This reduces the sensitivity and specificity of the study. Intracranial internal carotid arteries: Moderate narrowing of the distal petrous segment and communicating segment of the right ICA. There is fusiform dilatation of the cavernous segment of the left ICA, measuring up to 9 mm. Anterior cerebral arteries: Normal. Middle cerebral arteries: Normal. Posterior communicating arteries: Absent bilaterally. Posterior cerebral arteries: Normal. Basilar artery: Normal. Vertebral arteries: Codominant. Normal. Superior cerebellar arteries: Normal. Anterior inferior cerebellar arteries: Normal. Posterior inferior cerebellar arteries: Normal. IMPRESSION: 1. Acute ischemic infarct of the right frontal lobe involving the precentral gyrus, middle frontal gyrus and anterior insular cortex. No hemorrhage or significant mass  effect. 2. Moderate narrowing of the petrous segment and communicating segment of the right internal carotid artery show no on the MRA, though the images are motion degraded and this appearance may be partially artifactual. 3. Dilated cavernous segment of the left internal carotid artery cavernous segment, measuring up to 9 mm, suggesting dolichoectasia. As above, this may be exaggerated by motion artifact.  Electronically Signed   By: Ulyses Jarred M.D.   On: 02/23/2016 16:56   Mr Brain Wo Contrast  Result Date: 02/23/2016 CLINICAL DATA:  Confusion, left-sided weakness and facial droop. Right gaze abnormality. EXAM: MRI HEAD WITHOUT CONTRAST MRA HEAD WITHOUT CONTRAST TECHNIQUE: Multiplanar, multiecho pulse sequences of the brain and surrounding structures were obtained without intravenous contrast. Angiographic images of the head were obtained using MRA technique without contrast. COMPARISON:  None. FINDINGS: MRI HEAD FINDINGS Brain: There is diffusion restriction within the right frontal lobe, predominantly along the precentral gyrus, extending into the anterior right parietal lobe and also involving the right middle frontal gyrus. Small amount of diffusion restriction of the anterior insular cortex, along the short gyrus. The midline structures are normal. There is beginning confluent hyperintense T2-weighted signal within the periventricular white matter, most often seen in the setting of chronic microvascular ischemia. No mass lesion or midline shift. No hydrocephalus or extra-axial fluid collection. No age advanced or lobar predominant atrophy. Vascular: Major intracranial arterial and venous sinus flow voids are preserved. No evidence of chronic microhemorrhage or amyloid angiopathy. Skull and upper cervical spine: The visualized skull base, calvarium, upper cervical spine and extracranial soft tissues are normal. Sinuses/Orbits: No fluid levels or advanced mucosal thickening. No mastoid effusion. Normal orbits. MRA HEAD FINDINGS Despite efforts by the technologist and patient, motion artifact is present on today's examination and could not be eliminated. This reduces the sensitivity and specificity of the study. Intracranial internal carotid arteries: Moderate narrowing of the distal petrous segment and communicating segment of the right ICA. There is fusiform dilatation of the cavernous segment of the left  ICA, measuring up to 9 mm. Anterior cerebral arteries: Normal. Middle cerebral arteries: Normal. Posterior communicating arteries: Absent bilaterally. Posterior cerebral arteries: Normal. Basilar artery: Normal. Vertebral arteries: Codominant. Normal. Superior cerebellar arteries: Normal. Anterior inferior cerebellar arteries: Normal. Posterior inferior cerebellar arteries: Normal. IMPRESSION: 1. Acute ischemic infarct of the right frontal lobe involving the precentral gyrus, middle frontal gyrus and anterior insular cortex. No hemorrhage or significant mass effect. 2. Moderate narrowing of the petrous segment and communicating segment of the right internal carotid artery show no on the MRA, though the images are motion degraded and this appearance may be partially artifactual. 3. Dilated cavernous segment of the left internal carotid artery cavernous segment, measuring up to 9 mm, suggesting dolichoectasia. As above, this may be exaggerated by motion artifact. Electronically Signed   By: Ulyses Jarred M.D.   On: 02/23/2016 16:56   US Carotid Bilateral (at [facility] And [facility] Only)  Result Date: 02/24/2016 CLINICAL DATA:  82 year old male with acute right frontal lobe infarct EXAM: BILATERAL CAROTID DUPLEX ULTRASOUND TECHNIQUE: Pearline Cables scale imaging, color Doppler and duplex ultrasound were performed of bilateral carotid and vertebral arteries in the neck. COMPARISON:  Brain MRI 02/23/2016 FINDINGS: Criteria: Quantification of carotid stenosis is based on velocity parameters that correlate the residual internal carotid diameter with NASCET-based stenosis levels, using the diameter of the distal internal carotid lumen as the denominator for stenosis measurement. The following velocity measurements were obtained: RIGHT ICA:  95/12 cm/sec CCA:  95/8 cm/sec  SYSTOLIC ICA/CCA RATIO:  1.0 DIASTOLIC ICA/CCA RATIO:  1.6 ECA:  145 cm/sec LEFT ICA:  117/25 cm/sec CCA:  123456 cm/sec SYSTOLIC ICA/CCA RATIO:  1.4 DIASTOLIC ICA/CCA  RATIO:  2.4 ECA:  188 cm/sec RIGHT CAROTID ARTERY: Heterogeneous atherosclerotic plaque in the distal common carotid artery and proximal internal carotid artery. By peak systolic velocity criteria the estimated stenosis remains less than 50%. RIGHT VERTEBRAL ARTERY:  Patent with antegrade flow. LEFT CAROTID ARTERY: Mild heterogeneous atherosclerotic plaque in the distal common carotid and proximal internal carotid arteries. The peak systolic velocity criteria the estimated stenosis remains less than 50%. LEFT VERTEBRAL ARTERY:  Patent with normal antegrade flow. Other: 1.2 x 0.9 x 1.0 cm mixed cystic and solid nodule in the left gland with internal colloid artifact. This has an appearance most consistent with a benign colloid nodule. No further imaging or follow-up is required. IMPRESSION: 1. Mild (1-49%) stenosis proximal right internal carotid artery secondary to heterogenous atherosclerotic plaque. 2. Mild (1-49%) stenosis proximal left internal carotid artery secondary to heterogenous atherosclerotic plaque. 3. Vertebral arteries are patent with antegrade flow. Signed, Criselda Peaches, MD [facility] Electronically Signed   By: Jacqulynn Cadet M.D.   On: 02/24/2016 08:11    Medications:  I have reviewed the patient's current medications. Scheduled: . aspirin  300 mg Rectal Daily   Or  . aspirin  325 mg Oral Daily  . atorvastatin  40 mg Oral q1800  . enoxaparin (LOVENOX) injection  40 mg Subcutaneous Q24H  . mouth rinse  15 mL Mouth Rinse BID    Assessment/Plan: 82 year old male with acute right frontal infarct that  Appears embolic in etiology.  ASA increased.  Carotid dopplers show no evidence of hemodynamically significant stenosis.  Echocardiogram shows no cardiac source of emboli with an EF of 55-60%.  A1c 4.6, LDL 100.  Patient started on a statin with goal LDL<70.   Recommendations: 1.  Plavix 75mg  daily to start in AM with  discontinuation of ASA 2.  Continue therapy.      LOS: 2 days   Alexis Goodell, MD Neurology [phone] 02/25/2016  12:14 PM

## 2016-02-25 NOTE — Progress Notes (Signed)
[place] at [facility]                                                                                                                                                                                  Patient Demographics   Juan Mayo, is a 82 y.o. male, DOB - 1921-06-19, [id number]  Admit date - 02/23/2016   Admitting Physician Demetrios Loll, MD  Outpatient Primary MD for the patient is FITZGERALD, DAVID Mamie Nick, MD   LOS - 2  Subjective: Patient admitted with CVA his left-sided weakness is improved his speech is improved. He is having trouble with left-sided eye Working with physical therapy, OT.   Review of Systems:   CONSTITUTIONAL: No documented fever. No fatigue, weakness. No weight gain, no weight loss.  EYES: No blurry or double vision. Positive visual loss ENT: No tinnitus. No postnasal drip. No redness of the oropharynx.  RESPIRATORY: No cough, no wheeze, no hemoptysis. No dyspnea.  CARDIOVASCULAR: No chest pain. No orthopnea. No palpitations. No syncope.  GASTROINTESTINAL: No nausea, no vomiting or diarrhea. No abdominal pain. No melena or hematochezia.  GENITOURINARY: No dysuria or hematuria.  ENDOCRINE: No polyuria or nocturia. No heat or cold intolerance.  HEMATOLOGY: No anemia. No bruising. No bleeding.  INTEGUMENTARY: No rashes. No lesions.  MUSCULOSKELETAL: No arthritis. No swelling. No gout.  NEUROLOGIC: Left upper extremity weakness and difficulty with vision  PSYCHIATRIC: No anxiety. No insomnia. No ADD.    Vitals:   Vitals:   02/24/16 2043 02/24/16 2341 02/25/16 0434 02/25/16 0900  BP: (!) 157/61 (!) 150/64 (!) 164/74 (!) 163/61  Pulse: 84 77 74 88  Resp: 20 20 (!) 21 (!) 22  Temp: 97.5 F (36.4 C) 97.7 F (36.5 C) 98.3 F (36.8 C) 97.5 F (36.4 C)  TempSrc: Oral Oral Oral Oral  SpO2: 100% 100% 99% 100%  Weight:      Height:        Wt Readings from Last 3 Encounters:  02/23/16 53.9 kg (118 lb 14.4 oz)  05/05/15 56.7 kg  (125 lb)  03/21/15 64.1 kg (141 lb 6.4 oz)     Intake/Output Summary (Last 24 hours) at 02/25/16 1056 Last data filed at 02/25/16 1010  Gross per 24 hour  Intake              240 ml  Output              900 ml  Net             -660 ml    Physical Exam:   GENERAL: Pleasant-appearing in no apparent distress.  noted to have dysarthria. HEAD, EYES, EARS, NOSE  AND THROAT: Atraumatic, normocephalic. Right sided eye droop Pupils equal and reactive to light. Sclerae anicteric. No conjunctival injection. No oro-pharyngeal erythema.  NECK: Supple. There is no jugular venous distention. No bruits, no lymphadenopathy, no thyromegaly.  HEART: Regular rate and rhythm,. No murmurs, no rubs, no clicks.  LUNGS: Clear to auscultation bilaterally. No rales or rhonchi. No wheezes.  ABDOMEN: Soft, flat, nontender, nondistended. Has good bowel sounds. No hepatosplenomegaly appreciated.  EXTREMITIES: No evidence of any cyanosis, clubbing, or peripheral edema.  +2 pedal and radial pulses bilaterally.  NEUROLOGIC: The patient is alert, awake, and oriented x3 left upper extremity weakness /patient has intact. On right 5/5 UE and LE,    SKIN: Moist and warm with no rashes appreciated.  Psych: Not anxious, depressed LN: No inguinal LN enlargement    Antibiotics   Anti-infectives    None      Medications   Scheduled Meds: . aspirin  300 mg Rectal Daily   Or  . aspirin  325 mg Oral Daily  . atorvastatin  40 mg Oral q1800  . enoxaparin (LOVENOX) injection  40 mg Subcutaneous Q24H  . mouth rinse  15 mL Mouth Rinse BID   Continuous Infusions:  PRN Meds:.acetaminophen, senna-docusate   Data Review:   Micro Results No results found for this or any previous visit (from the past 240 hour(s)).  Radiology Reports Dg Chest 1 View  Result Date: 02/23/2016 CLINICAL DATA:  Facial droop, confusion. EXAM: CHEST 1 VIEW COMPARISON:  None. FINDINGS: The heart size and mediastinal contours are within  normal limits. Both lungs are clear. No pneumothorax or pleural effusion is noted. Status post coronary bypass graft. The visualized skeletal structures are unremarkable. IMPRESSION: No acute cardiopulmonary abnormality seen. Electronically Signed   By: Marijo Conception, M.D.   On: 02/23/2016 14:12   Ct Head Wo Contrast  Result Date: 02/23/2016 CLINICAL DATA:  Confusion, left side weakness, facial droop EXAM: CT HEAD WITHOUT CONTRAST TECHNIQUE: Contiguous axial images were obtained from the base of the skull through the vertex without intravenous contrast. COMPARISON:  None. FINDINGS: Brain: No intracranial hemorrhage, mass effect or midline shift. Axial image 21 and 22 there is subtle area of decreased attenuation in right parietal lobe peripheral. Evolving ischemia/infarct cannot be excluded. Clinical correlation is necessary. Further correlation with MRI is recommended. Mild cerebral atrophy. Periventricular and patchy subcortical white matter decreased attenuation probable due to chronic small vessel ischemic changes. Vascular: Atherosclerotic calcifications of carotid siphon Skull: No skull fracture is noted. Sinuses/Orbits: No acute findings Other: None IMPRESSION: No intracranial hemorrhage, mass effect or midline shift. Axial image 21 and 22 there is subtle area of decreased attenuation in right parietal lobe peripheral. Evolving ischemia/infarct is highly suspected. Clinical correlation is necessary. Further correlation with MRI is recommended. Electronically Signed   By: Lahoma Crocker M.D.   On: 02/23/2016 14:18   [name] Head Wo Contrast  Result Date: 02/23/2016 CLINICAL DATA:  Confusion, left-sided weakness and facial droop. Right gaze abnormality. EXAM: MRI HEAD WITHOUT CONTRAST MRA HEAD WITHOUT CONTRAST TECHNIQUE: Multiplanar, multiecho pulse sequences of the brain and surrounding structures were obtained without intravenous contrast. Angiographic images of the head were obtained using MRA  technique without contrast. COMPARISON:  None. FINDINGS: MRI HEAD FINDINGS Brain: There is diffusion restriction within the right frontal lobe, predominantly along the precentral gyrus, extending into the anterior right parietal lobe and also involving the right middle frontal gyrus. Small amount of diffusion restriction of the anterior insular cortex, along  the short gyrus. The midline structures are normal. There is beginning confluent hyperintense T2-weighted signal within the periventricular white matter, most often seen in the setting of chronic microvascular ischemia. No mass lesion or midline shift. No hydrocephalus or extra-axial fluid collection. No age advanced or lobar predominant atrophy. Vascular: Major intracranial arterial and venous sinus flow voids are preserved. No evidence of chronic microhemorrhage or amyloid angiopathy. Skull and upper cervical spine: The visualized skull base, calvarium, upper cervical spine and extracranial soft tissues are normal. Sinuses/Orbits: No fluid levels or advanced mucosal thickening. No mastoid effusion. Normal orbits. MRA HEAD FINDINGS Despite efforts by the technologist and patient, motion artifact is present on today's examination and could not be eliminated. This reduces the sensitivity and specificity of the study. Intracranial internal carotid arteries: Moderate narrowing of the distal petrous segment and communicating segment of the right ICA. There is fusiform dilatation of the cavernous segment of the left ICA, measuring up to 9 mm. Anterior cerebral arteries: Normal. Middle cerebral arteries: Normal. Posterior communicating arteries: Absent bilaterally. Posterior cerebral arteries: Normal. Basilar artery: Normal. Vertebral arteries: Codominant. Normal. Superior cerebellar arteries: Normal. Anterior inferior cerebellar arteries: Normal. Posterior inferior cerebellar arteries: Normal. IMPRESSION: 1. Acute ischemic infarct of the right frontal lobe involving  the precentral gyrus, middle frontal gyrus and anterior insular cortex. No hemorrhage or significant mass effect. 2. Moderate narrowing of the petrous segment and communicating segment of the right internal carotid artery show no on the MRA, though the images are motion degraded and this appearance may be partially artifactual. 3. Dilated cavernous segment of the left internal carotid artery cavernous segment, measuring up to 9 mm, suggesting dolichoectasia. As above, this may be exaggerated by motion artifact. Electronically Signed   By: Ulyses Jarred M.D.   On: 02/23/2016 16:56   Mr Brain Wo Contrast  Result Date: 02/23/2016 CLINICAL DATA:  Confusion, left-sided weakness and facial droop. Right gaze abnormality. EXAM: MRI HEAD WITHOUT CONTRAST MRA HEAD WITHOUT CONTRAST TECHNIQUE: Multiplanar, multiecho pulse sequences of the brain and surrounding structures were obtained without intravenous contrast. Angiographic images of the head were obtained using MRA technique without contrast. COMPARISON:  None. FINDINGS: MRI HEAD FINDINGS Brain: There is diffusion restriction within the right frontal lobe, predominantly along the precentral gyrus, extending into the anterior right parietal lobe and also involving the right middle frontal gyrus. Small amount of diffusion restriction of the anterior insular cortex, along the short gyrus. The midline structures are normal. There is beginning confluent hyperintense T2-weighted signal within the periventricular white matter, most often seen in the setting of chronic microvascular ischemia. No mass lesion or midline shift. No hydrocephalus or extra-axial fluid collection. No age advanced or lobar predominant atrophy. Vascular: Major intracranial arterial and venous sinus flow voids are preserved. No evidence of chronic microhemorrhage or amyloid angiopathy. Skull and upper cervical spine: The visualized skull base, calvarium, upper cervical spine and extracranial soft  tissues are normal. Sinuses/Orbits: No fluid levels or advanced mucosal thickening. No mastoid effusion. Normal orbits. MRA HEAD FINDINGS Despite efforts by the technologist and patient, motion artifact is present on today's examination and could not be eliminated. This reduces the sensitivity and specificity of the study. Intracranial internal carotid arteries: Moderate narrowing of the distal petrous segment and communicating segment of the right ICA. There is fusiform dilatation of the cavernous segment of the left ICA, measuring up to 9 mm. Anterior cerebral arteries: Normal. Middle cerebral arteries: Normal. Posterior communicating arteries: Absent bilaterally. Posterior cerebral arteries: Normal. Basilar  artery: Normal. Vertebral arteries: Codominant. Normal. Superior cerebellar arteries: Normal. Anterior inferior cerebellar arteries: Normal. Posterior inferior cerebellar arteries: Normal. IMPRESSION: 1. Acute ischemic infarct of the right frontal lobe involving the precentral gyrus, middle frontal gyrus and anterior insular cortex. No hemorrhage or significant mass effect. 2. Moderate narrowing of the petrous segment and communicating segment of the right internal carotid artery show no on the MRA, though the images are motion degraded and this appearance may be partially artifactual. 3. Dilated cavernous segment of the left internal carotid artery cavernous segment, measuring up to 9 mm, suggesting dolichoectasia. As above, this may be exaggerated by motion artifact. Electronically Signed   By: Ulyses Jarred M.D.   On: 02/23/2016 16:56   US Carotid Bilateral (at [facility] And [facility] Only)  Result Date: 02/24/2016 CLINICAL DATA:  82 year old male with acute right frontal lobe infarct EXAM: BILATERAL CAROTID DUPLEX ULTRASOUND TECHNIQUE: Pearline Cables scale imaging, color Doppler and duplex ultrasound were performed of bilateral carotid and vertebral arteries in the neck. COMPARISON:  Brain MRI 02/23/2016 FINDINGS:  Criteria: Quantification of carotid stenosis is based on velocity parameters that correlate the residual internal carotid diameter with NASCET-based stenosis levels, using the diameter of the distal internal carotid lumen as the denominator for stenosis measurement. The following velocity measurements were obtained: RIGHT ICA:  95/12 cm/sec CCA:  123456 cm/sec SYSTOLIC ICA/CCA RATIO:  1.0 DIASTOLIC ICA/CCA RATIO:  1.6 ECA:  145 cm/sec LEFT ICA:  117/25 cm/sec CCA:  123456 cm/sec SYSTOLIC ICA/CCA RATIO:  1.4 DIASTOLIC ICA/CCA RATIO:  2.4 ECA:  188 cm/sec RIGHT CAROTID ARTERY: Heterogeneous atherosclerotic plaque in the distal common carotid artery and proximal internal carotid artery. By peak systolic velocity criteria the estimated stenosis remains less than 50%. RIGHT VERTEBRAL ARTERY:  Patent with antegrade flow. LEFT CAROTID ARTERY: Mild heterogeneous atherosclerotic plaque in the distal common carotid and proximal internal carotid arteries. The peak systolic velocity criteria the estimated stenosis remains less than 50%. LEFT VERTEBRAL ARTERY:  Patent with normal antegrade flow. Other: 1.2 x 0.9 x 1.0 cm mixed cystic and solid nodule in the left gland with internal colloid artifact. This has an appearance most consistent with a benign colloid nodule. No further imaging or follow-up is required. IMPRESSION: 1. Mild (1-49%) stenosis proximal right internal carotid artery secondary to heterogenous atherosclerotic plaque. 2. Mild (1-49%) stenosis proximal left internal carotid artery secondary to heterogenous atherosclerotic plaque. 3. Vertebral arteries are patent with antegrade flow. Signed, Criselda Peaches, MD [name] Radiology Electronically Signed   By: Jacqulynn Cadet M.D.   On: 02/24/2016 08:11     CBC  Recent Labs Lab 02/23/16 1329  WBC 6.1  HGB 11.7*  HCT 34.3*  PLT 214  MCV 91.1  MCH 31.0  MCHC 34.0  RDW 12.9  LYMPHSABS 0.3*  MONOABS  0.3  EOSABS 0.0  BASOSABS 0.0    Chemistries   Recent Labs Lab 02/23/16 1329  NA 136  K 4.2  CL 99*  CO2 30  GLUCOSE 95  BUN 14  CREATININE 0.72  CALCIUM 8.9  AST 17  ALT 14*  ALKPHOS 98  BILITOT 0.8   ------------------------------------------------------------------------------------------------------------------ estimated creatinine clearance is 44.9 mL/min (by C-G formula based on SCr of 0.72 mg/dL). ------------------------------------------------------------------------------------------------------------------  Recent Labs  02/24/16 0535  HGBA1C 4.6*   ------------------------------------------------------------------------------------------------------------------  Recent Labs  02/24/16 0535  CHOL 155  HDL 46  LDLCALC 100*  TRIG 46  CHOLHDL 3.4   ------------------------------------------------------------------------------------------------------------------ No results for input(s): TSH, T4TOTAL,  T3FREE, THYROIDAB in the last 72 hours.  Invalid input(s): FREET3 ------------------------------------------------------------------------------------------------------------------ No results for input(s): VITAMINB12, FOLATE, FERRITIN, TIBC, IRON, RETICCTPCT in the last 72 hours.  Coagulation profile  Recent Labs Lab 02/23/16 1329  INR 1.05    No results for input(s): DDIMER in the last 72 hours.  Cardiac Enzymes  Recent Labs Lab 02/23/16 1329  TROPONINI <0.03   ------------------------------------------------------------------------------------------------------------------ Invalid input(s): POCBNP    Assessment & Plan  Patient is a 82 year old with acute CVA 1. Acute CVA.  MRI confirms right frontal CVA Continue therapy with aspirin High-dose statins Echo pending Culture therapy recommended SNF  Placement,  2. HTN. Resume loppresor  3. HLP continue lipitor   4. miscL lovenox for dvt proph.  All the records are reviewed and  case discussed with ED provider.     Code Status Orders        Start     Ordered   02/23/16 1557  Full code  Continuous     02/23/16 1556    Code Status History    Date Active Date Inactive Code Status Order ID Comments User Context   03/18/2015  5:58 PM 03/21/2015  5:03 PM Full Code [id number]  [name], MD Inpatient   03/18/2015  8:20 AM 03/18/2015  5:58 PM Full Code [id number]  Harrie Foreman, MD Inpatient   03/18/2015  5:55 AM 03/18/2015  8:20 AM Full Code [id number]  [name], MD ED    Advance Directive Documentation   Flowsheet Row Most Recent Value  Type of Advance Directive  Healthcare Power of Attorney  Pre-existing out of facility DNR order (yellow form or pink MOST form)  No data  "MOST" Form in Place?  No data     CODE STATUS discussed with patient encouraged DO NOT RESUSCITATE status he will discuss with the rest of the family regarding his CODE STATUS      Consults neuro   DVT Prophylaxis  Lovenox   Lab Results  Component Value Date   PLT 214 02/23/2016     Time Spent in minutes  56min  Greater than 50% of time spent in care coordination and counseling patient regarding the condition and plan of care.   Epifanio Lesches M.D on 02/25/2016 at 10:56 AM  Between 7am to 6pm - Pager - [phone]  After 6pm go to [email or web address] - password [email or web address] [facility]   Office  [phone]

## 2016-02-25 NOTE — Discharge Summary (Addendum)
Juan Mayo, is a 82 y.o. male  DOB March 28, 1922  MRN [id number].  Admission date:  02/23/2016  Admitting Physician  Demetrios Loll, MD  Discharge Date:  02/25/2016   Primary MD  Leonel Ramsay, MD  Recommendations for primary care physician for things to follow:  Follow up with PMD in one week   Admission Diagnosis  CVA (cerebral vascular accident) ([name]) [I63.9] (HFpEF) heart failure with preserved ejection fraction ([name]) [I50.30]   Discharge Diagnosis  CVA (cerebral vascular accident) ([place]) [I63.9] (HFpEF) heart failure with preserved ejection fraction ([place]) [I50.30]    Active Problems:   CVA (cerebral vascular accident) [name])      Past Medical History:  Diagnosis Date  . Hypercholesteremia   . Neuropathy ([name])   . Prostate cancer [facility])     Past Surgical History:  Procedure Laterality Date  . AORTIC VALVE REPLACEMENT    . BACK SURGERY    . CORONARY ARTERY BYPASS GRAFT    . FRACTURE SURGERY Right   . INTRAMEDULLARY (IM) NAIL INTERTROCHANTERIC Right 03/18/2015   Procedure: INTRAMEDULLARY (IM) NAIL INTERTROCHANTRIC;  Surgeon: [name], MD;  Location: [facility] ORS;  Service: Orthopedics;  Laterality: Right;  . PROSTATECTOMY    . REPLACEMENT TOTAL KNEE BILATERAL         History of present illness and  Hospital Course:     Kindly see H&P for history of present illness and admission details, please review complete Labs, Consult reports and Test reports for all details in brief  HPI  from the history and physical done on the day of admission Due to-year-old male patient with hypertension, hyperlipidemia, prostate cancer admitted because of left-sided weakness, dysarthria, right-sided gaze palsy, left facial droop, found to have acute stroke.   Hospital Course  #1 acute right frontal infarct appears to be  embolic as per neurology discussion: Patient started on aspirin, ultrasound of carotids did not show hemodynamically significant stenosis. Echo showed no cardiac source of emboli, EF more than 55%. LDL 100. Seen by neurology, started on Plavix and discontinue aspirin. Physical therapy recommended rehabilitation, pt [facility]. patient  Can be discharge when the bed is available /MRI of the brain confirmed with acute ischemic infarct in the right frontal lobe. Speech therapy recommended. Puree  Diet with nectar thick liquids. #2 essential hypertension: Controlled with Lopressor #3 history of prostate cancer #4 hyperlipidemia;  Discharge Condition: stable   Follow UP      Discharge Instructions  and  Discharge Medications    Chopped diet with NTL    Medication List    STOP taking these medications   aspirin 81 MG tablet     TAKE these medications   atorvastatin 40 MG tablet Commonly known as:  LIPITOR Take 1 tablet (40 mg total) by mouth daily at 6 PM.   clopidogrel 75 MG tablet Commonly known as:  PLAVIX Take 1 tablet (75 mg total) by mouth daily. Start taking on:  02/26/2016   metoprolol tartrate 25 MG tablet Commonly known as:  LOPRESSOR Take 25 mg by mouth 2 (two) times daily.   mouth rinse Liqd solution 15 mLs by Mouth Rinse route 2 (two) times daily.   senna-docusate 8.6-50 MG tablet Commonly known as:  Senokot-S Take 1 tablet by mouth at bedtime as needed for mild constipation.         Diet and Activity recommendation: See Discharge Instructions above   Consults obtained -Neurology,PHysical therapy,OT   Major procedures and Radiology Reports -  PLEASE review detailed and final reports for all details, in brief -     Dg Chest 1 View  Result Date: 02/23/2016 CLINICAL DATA:  Facial droop, confusion. EXAM: CHEST 1 VIEW COMPARISON:  None. FINDINGS: The heart size and mediastinal contours are within normal limits. Both lungs are clear. No  pneumothorax or pleural effusion is noted. Status post coronary bypass graft. The visualized skeletal structures are unremarkable. IMPRESSION: No acute cardiopulmonary abnormality seen. Electronically Signed   By: Marijo Conception, M.D.   On: 02/23/2016 14:12   Ct Head Wo Contrast  Result Date: 02/23/2016 CLINICAL DATA:  Confusion, left side weakness, facial droop EXAM: CT HEAD WITHOUT CONTRAST TECHNIQUE: Contiguous axial images were obtained from the base of the skull through the vertex without intravenous contrast. COMPARISON:  None. FINDINGS: Brain: No intracranial hemorrhage, mass effect or midline shift. Axial image 21 and 22 there is subtle area of decreased attenuation in right parietal lobe peripheral. Evolving ischemia/infarct cannot be excluded. Clinical correlation is necessary. Further correlation with MRI is recommended. Mild cerebral atrophy. Periventricular and patchy subcortical white matter decreased attenuation probable due to chronic small vessel ischemic changes. Vascular: Atherosclerotic calcifications of carotid siphon Skull: No skull fracture is noted. Sinuses/Orbits: No acute findings Other: None IMPRESSION: No intracranial hemorrhage, mass effect or midline shift. Axial image 21 and 22 there is subtle area of decreased attenuation in right parietal lobe peripheral. Evolving ischemia/infarct is highly suspected. Clinical correlation is necessary. Further correlation with MRI is recommended. Electronically Signed   By: Lahoma Crocker M.D.   On: 02/23/2016 14:18   [name] Head Wo Contrast  Result Date: 02/23/2016 CLINICAL DATA:  Confusion, left-sided weakness and facial droop. Right gaze abnormality. EXAM: MRI HEAD WITHOUT CONTRAST MRA HEAD WITHOUT CONTRAST TECHNIQUE: Multiplanar, multiecho pulse sequences of the brain and surrounding structures were obtained without intravenous contrast. Angiographic images of the head were obtained using MRA technique without contrast. COMPARISON:  None.  FINDINGS: MRI HEAD FINDINGS Brain: There is diffusion restriction within the right frontal lobe, predominantly along the precentral gyrus, extending into the anterior right parietal lobe and also involving the right middle frontal gyrus. Small amount of diffusion restriction of the anterior insular cortex, along the short gyrus. The midline structures are normal. There is beginning confluent hyperintense T2-weighted signal within the periventricular white matter, most often seen in the setting of chronic microvascular ischemia. No mass lesion or midline shift. No hydrocephalus or extra-axial fluid collection. No age advanced or lobar predominant atrophy. Vascular: Major intracranial arterial and venous sinus flow voids are preserved. No evidence of chronic microhemorrhage or amyloid angiopathy. Skull and upper cervical spine: The visualized skull base, calvarium, upper cervical spine and extracranial soft tissues are normal. Sinuses/Orbits: No fluid levels or advanced mucosal thickening. No mastoid effusion. Normal orbits. MRA HEAD FINDINGS Despite efforts by the technologist and patient, motion artifact is present on today's examination and could not be eliminated. This reduces the sensitivity and specificity of the study. Intracranial internal carotid arteries: Moderate narrowing of the distal petrous segment and communicating segment of the right ICA. There is fusiform dilatation of the cavernous segment of the left ICA, measuring up to 9 mm. Anterior cerebral arteries: Normal. Middle cerebral arteries: Normal. Posterior communicating arteries: Absent bilaterally. Posterior cerebral arteries: Normal. Basilar artery: Normal. Vertebral arteries: Codominant. Normal. Superior cerebellar arteries: Normal. Anterior inferior cerebellar arteries: Normal. Posterior inferior cerebellar arteries: Normal. IMPRESSION: 1. Acute ischemic infarct of the right frontal lobe involving the precentral gyrus, middle frontal  gyrus and  anterior insular cortex. No hemorrhage or significant mass effect. 2. Moderate narrowing of the petrous segment and communicating segment of the right internal carotid artery show no on the MRA, though the images are motion degraded and this appearance may be partially artifactual. 3. Dilated cavernous segment of the left internal carotid artery cavernous segment, measuring up to 9 mm, suggesting dolichoectasia. As above, this may be exaggerated by motion artifact. Electronically Signed   By: Ulyses Jarred M.D.   On: 02/23/2016 16:56   Mr Brain Wo Contrast  Result Date: 02/23/2016 CLINICAL DATA:  Confusion, left-sided weakness and facial droop. Right gaze abnormality. EXAM: MRI HEAD WITHOUT CONTRAST MRA HEAD WITHOUT CONTRAST TECHNIQUE: Multiplanar, multiecho pulse sequences of the brain and surrounding structures were obtained without intravenous contrast. Angiographic images of the head were obtained using MRA technique without contrast. COMPARISON:  None. FINDINGS: MRI HEAD FINDINGS Brain: There is diffusion restriction within the right frontal lobe, predominantly along the precentral gyrus, extending into the anterior right parietal lobe and also involving the right middle frontal gyrus. Small amount of diffusion restriction of the anterior insular cortex, along the short gyrus. The midline structures are normal. There is beginning confluent hyperintense T2-weighted signal within the periventricular white matter, most often seen in the setting of chronic microvascular ischemia. No mass lesion or midline shift. No hydrocephalus or extra-axial fluid collection. No age advanced or lobar predominant atrophy. Vascular: Major intracranial arterial and venous sinus flow voids are preserved. No evidence of chronic microhemorrhage or amyloid angiopathy. Skull and upper cervical spine: The visualized skull base, calvarium, upper cervical spine and extracranial soft tissues are normal. Sinuses/Orbits: No fluid levels  or advanced mucosal thickening. No mastoid effusion. Normal orbits. MRA HEAD FINDINGS Despite efforts by the technologist and patient, motion artifact is present on today's examination and could not be eliminated. This reduces the sensitivity and specificity of the study. Intracranial internal carotid arteries: Moderate narrowing of the distal petrous segment and communicating segment of the right ICA. There is fusiform dilatation of the cavernous segment of the left ICA, measuring up to 9 mm. Anterior cerebral arteries: Normal. Middle cerebral arteries: Normal. Posterior communicating arteries: Absent bilaterally. Posterior cerebral arteries: Normal. Basilar artery: Normal. Vertebral arteries: Codominant. Normal. Superior cerebellar arteries: Normal. Anterior inferior cerebellar arteries: Normal. Posterior inferior cerebellar arteries: Normal. IMPRESSION: 1. Acute ischemic infarct of the right frontal lobe involving the precentral gyrus, middle frontal gyrus and anterior insular cortex. No hemorrhage or significant mass effect. 2. Moderate narrowing of the petrous segment and communicating segment of the right internal carotid artery show no on the MRA, though the images are motion degraded and this appearance may be partially artifactual. 3. Dilated cavernous segment of the left internal carotid artery cavernous segment, measuring up to 9 mm, suggesting dolichoectasia. As above, this may be exaggerated by motion artifact. Electronically Signed   By: Ulyses Jarred M.D.   On: 02/23/2016 16:56   US Carotid Bilateral (at [facility] Only)  Result Date: 02/24/2016 CLINICAL DATA:  82 year old male with acute right frontal lobe infarct EXAM: BILATERAL CAROTID DUPLEX ULTRASOUND TECHNIQUE: Pearline Cables scale imaging, color Doppler and duplex ultrasound were performed of bilateral carotid and vertebral arteries in the neck. COMPARISON:  Brain MRI 02/23/2016 FINDINGS: Criteria: Quantification of carotid stenosis is based on  velocity parameters that correlate the residual internal carotid diameter with NASCET-based stenosis levels, using the diameter of the distal internal carotid lumen as the denominator for stenosis measurement. The following velocity measurements  were obtained: RIGHT ICA:  95/12 cm/sec CCA:  123456 cm/sec SYSTOLIC ICA/CCA RATIO:  1.0 DIASTOLIC ICA/CCA RATIO:  1.6 ECA:  145 cm/sec LEFT ICA:  117/25 cm/sec CCA:  123456 cm/sec SYSTOLIC ICA/CCA RATIO:  1.4 DIASTOLIC ICA/CCA RATIO:  2.4 ECA:  188 cm/sec RIGHT CAROTID ARTERY: Heterogeneous atherosclerotic plaque in the distal common carotid artery and proximal internal carotid artery. By peak systolic velocity criteria the estimated stenosis remains less than 50%. RIGHT VERTEBRAL ARTERY:  Patent with antegrade flow. LEFT CAROTID ARTERY: Mild heterogeneous atherosclerotic plaque in the distal common carotid and proximal internal carotid arteries. The peak systolic velocity criteria the estimated stenosis remains less than 50%. LEFT VERTEBRAL ARTERY:  Patent with normal antegrade flow. Other: 1.2 x 0.9 x 1.0 cm mixed cystic and solid nodule in the left gland with internal colloid artifact. This has an appearance most consistent with a benign colloid nodule. No further imaging or follow-up is required. IMPRESSION: 1. Mild (1-49%) stenosis proximal right internal carotid artery secondary to heterogenous atherosclerotic plaque. 2. Mild (1-49%) stenosis proximal left internal carotid artery secondary to heterogenous atherosclerotic plaque. 3. Vertebral arteries are patent with antegrade flow. Signed, Criselda Peaches, MD [facility] Radiology Electronically Signed   By: Jacqulynn Cadet M.D.   On: 02/24/2016 08:11    Micro Results    No results found for this or any previous visit (from the past 240 hour(s)).     Today   Subjective:   Cathlean Cower today  ,no  New neuro deficits.  Objective:   Blood pressure (!)  154/77, pulse 75, temperature 98 F (36.7 C), temperature source Oral, resp. rate 18, height 5\' 7"  (1.702 m), weight 53.9 kg (118 lb 14.4 oz), SpO2 100 %.   Intake/Output Summary (Last 24 hours) at 02/25/16 1430 Last data filed at 02/25/16 1404  Gross per 24 hour  Intake              480 ml  Output              600 ml  Net             -120 ml    Exam Awake Alert, Oriented x 3, slightly weak on left side,left gaze defect [place].AT,PERRAL Supple Neck,No JVD, No cervical lymphadenopathy appriciated.  Symmetrical Chest wall movement, Good air movement bilaterally, CTAB RRR,No Gallops,Rubs or new Murmurs, No Parasternal Heave +ve B.Sounds, Abd Soft, Non tender, No organomegaly appriciated, No rebound -guarding or rigidity. No Cyanosis, Clubbing or edema, No new Rash or bruise  Data Review   CBC w Diff:  Lab Results  Component Value Date   WBC 6.1 02/23/2016   HGB 11.7 (L) 02/23/2016   HGB 11.6 (L) 08/11/2013   HCT 34.3 (L) 02/23/2016   HCT 34.0 (L) 08/11/2013   PLT 214 02/23/2016   PLT 161 08/11/2013   LYMPHOPCT 6 02/23/2016   LYMPHOPCT 13.3 11/08/2012   MONOPCT 5 02/23/2016   MONOPCT 9.3 11/08/2012   EOSPCT 0 02/23/2016   EOSPCT 2.4 11/08/2012   BASOPCT 1 02/23/2016   BASOPCT 0.8 11/08/2012    CMP:  Lab Results  Component Value Date   NA 136 02/23/2016   NA 139 08/11/2013   K 4.2 02/23/2016   K 4.9 08/11/2013   CL 99 (L) 02/23/2016   CL 105 08/11/2013   CO2 30 02/23/2016   CO2 30 08/11/2013   BUN 14 02/23/2016   BUN 20 (H) 08/11/2013   CREATININE 0.72  02/23/2016   CREATININE 0.77 08/11/2013   PROT 6.8 02/23/2016   ALBUMIN 3.8 02/23/2016   BILITOT 0.8 02/23/2016   ALKPHOS 98 02/23/2016   AST 17 02/23/2016   ALT 14 (L) 02/23/2016  .   Total Time in preparing paper work, data evaluation and todays exam - 33 minutes  Marsha Hillman M.D on 02/25/2016 at 2:30 PM    Note: This dictation was prepared with Dragon dictation along with smaller phrase  technology. Any transcriptional errors that result from this process are unintentional.

## 2016-02-25 NOTE — NC FL2 (Signed)
  [place] LEVEL OF CARE SCREENING TOOL     IDENTIFICATION  Patient Name: Juan Mayo Birthdate: 27-Apr-1921 Sex: male Admission Date (Current Location): 02/23/2016  [place] and [place] Number:  Engineering geologist and Address:  [facility], [address] [place], [place] [address]      Provider Number: [id number]  Attending Physician Name and Address:  Epifanio Lesches, MD  Relative Name and Phone Number:       Current Level of Care: Hospital Recommended Level of Care: [place] Prior Approval Number:    Date Approved/Denied:   PASRR Number:  ([id number])  Discharge Plan: SNF    Current Diagnoses: Patient Active Problem List   Diagnosis Date Noted  . CVA (cerebral vascular accident) ([place]) 02/23/2016  . Hip fracture ([place]) 03/18/2015    Orientation RESPIRATION BLADDER Height & Weight     Self, Time, Situation, Place  Normal Continent Weight: 118 lb 14.4 oz (53.9 kg) Height:  5\' 7"  (170.2 cm)  BEHAVIORAL SYMPTOMS/MOOD NEUROLOGICAL BOWEL NUTRITION STATUS   (none )  (none) Continent Diet (DYS 1)  AMBULATORY STATUS COMMUNICATION OF NEEDS Skin   Extensive Assist Verbally Normal                       Personal Care Assistance Level of Assistance  Bathing, Feeding, Dressing Bathing Assistance: Limited assistance Feeding assistance: Independent Dressing Assistance: Limited assistance     Functional Limitations Info  Sight, Hearing, Speech Sight Info: Impaired Hearing Info: Impaired Speech Info: Adequate    SPECIAL CARE FACTORS FREQUENCY  PT (By licensed PT), OT (By licensed OT), Speech therapy     PT Frequency:  (5) OT Frequency:  (5)     Speech Therapy Frequency:  (determined by facility )      Contractures      Additional Factors Info  Code Status, Allergies Code Status Info:  (Full Code. ) Allergies Info:  (No Known Allergies. )           Current Medications (02/25/2016):  This  is the current hospital active medication list Current Facility-Administered Medications  Medication Dose Route Frequency Provider Last Rate Last Dose  . acetaminophen (TYLENOL) tablet 650 mg  650 mg Oral Q6H PRN Saundra Shelling, MD   650 mg at 02/25/16 0648  . aspirin suppository 300 mg  300 mg Rectal Daily Demetrios Loll, MD   300 mg at 02/23/16 1801   Or  . aspirin tablet 325 mg  325 mg Oral Daily Demetrios Loll, MD   325 mg at 02/25/16 0953  . atorvastatin (LIPITOR) tablet 40 mg  40 mg Oral q1800 Demetrios Loll, MD   40 mg at 02/24/16 1752  . enoxaparin (LOVENOX) injection 40 mg  40 mg Subcutaneous Q24H Demetrios Loll, MD   40 mg at 02/24/16 1947  . MEDLINE mouth rinse  15 mL Mouth Rinse BID Dustin Flock, MD   15 mL at 02/25/16 0953  . senna-docusate (Senokot-S) tablet 1 tablet  1 tablet Oral QHS PRN Demetrios Loll, MD         Discharge Medications: Please see discharge summary for a list of discharge medications.  Relevant Imaging Results:  Relevant Lab Results:   Additional Information  SSN: [id number]  Ravindra Baranek, Veronia Beets, LCSW

## 2016-02-25 NOTE — Clinical Social Work Note (Signed)
Clinical Social Work Assessment  Patient Details  Name: Juan Mayo MRN: [id number] Date of Birth: 1921-12-03  Date of referral:  02/25/16               Reason for consult:  Facility Placement                Permission sought to share information with:  Chartered certified accountant granted to share information::  Yes, Verbal Permission Granted  Name::      [facility]   Agency::   [place]   Relationship::     Contact Information:     Housing/Transportation Living arrangements for the past 2 months:  [place] of Information:  Patient, Other (Comment Required) (Sister Juan Mayo ) Patient Interpreter Needed:  None Criminal Activity/Legal Involvement Pertinent to Current Situation/Hospitalization:  No - Comment as needed Significant Relationships:  Other Family Members, Siblings Lives with:  Self Do you feel safe going back to the place where you live?  Yes Need for family participation in patient care:  Yes (Comment)  Care giving concerns:  Patient lives alone in [place].    Social Worker assessment / plan:  Holiday representative (CSW) received SNF consult and that patient is ready for D/C today. PT is recommending SNF. Patient had a CVA. CSW met with patient and his sister/ HPOA Juan Mayo [phone] was at bedside. CSW introduced self and explained role of CSW department. Patient reported that he lives alone and his sister is his primary support. CSW explained SNF process and that patient is ready for D/C today. CSW also explained that patient's [employer] insurance requires authorization. Patient and sister are agreeable to SNF search in [place]. Patient reported that he has been to [facility] before when he broke his hip a year ago however he does not want to go back there.   CSW presented bed offers to patient he chose [facility]. Patient is medically stable for D/C to [facility] today. Per Tammy admissions coordinator at [facility] patient will go  to room 508. RN will call report to Theressa Stamps RN at [facility] and arrange EMS for transport. [employer] authorization has been received. Auth # [id number]. CSW sent D/C orders to Tammy via [place]. Please reconsult if future social work needs arise. CSW signing off.     Employment status:  Retired Nurse, adult PT Recommendations:  [place] / Referral to community resources:  [place]  Patient/Family's Response to care:  Patient and sister are agreeable for patient to go to [facility].   Patient/Family's Understanding of and Emotional Response to Diagnosis, Current Treatment, and Prognosis:  Patient was pleasant and thanked CSW for assistance.   Emotional Assessment Appearance:  Appears stated age Attitude/Demeanor/Rapport:    Affect (typically observed):  Accepting, Adaptable, Pleasant Orientation:  Oriented to Self, Oriented to Place, Oriented to  Time, Oriented to Situation Alcohol / Substance use:  Not Applicable Psych involvement (Current and /or in the community):  No (Comment)  Discharge Needs  Concerns to be addressed:  Discharge Planning Concerns Readmission within the last 30 days:  No Current discharge risk:  Dependent with Mobility Barriers to Discharge:  Continued Medical Work up   [employer], Veronia Beets, LCSW 02/25/2016, 2:56 PM

## 2016-02-25 NOTE — Progress Notes (Signed)
Report called to Marliss Coots, Therapist, sports at [employer]. Awaiting transportation. Madlyn Frankel, RN

## 2016-02-25 NOTE — Progress Notes (Signed)
EMS called for transport. Siyana Erney S, RN  

## 2016-02-25 NOTE — Plan of Care (Signed)
Problem: SLP Dysphagia Goals Goal: Misc Dysphagia Goal Pt will safely tolerate po diet of least restrictive consistency w/ no overt s/s of aspiration noted by Staff/pt/family x3 sessions.    

## 2016-02-25 NOTE — Progress Notes (Signed)
Speech Language Pathology Treatment: Dysphagia  Patient Details Name: Juan Mayo MRN: [id number] DOB: 16-Mar-1922 Today's Date: 02/25/2016 Time: 1250-1400 SLP Time Calculation (min) (ACUTE ONLY): 70 min  Assessment / Plan / Recommendation Clinical Impression  Pt continues to present w/ oropharyngeal phase dysphagia w/ inconsistent, mild s/s of aspiration w/ apparent pharyngeal residue from po trials as well as native saliva - pt exhibited a wet vocal quality talking w/ SLP and Sister prior to any po trials. Pt appeared to better tolerate trials of Nectar liquids via cup and straw (new attempts today) and Purees w/ no immediate s/s of aspiration noted when following strict aspiration precautions. Pt did not tilt head back as much today to aid A-P transfer(suspected) and noted less min lingual protrusion during the swallow w/ trials of liquids. Of note, pt exhibit a change in vocal quality (wetness) post consecutive boluses. Pt exhibited oral phase deficits c/b reduced labial seal on Left side of mouth w/ slight-min leakage w/ po trials intermittently(moreso liquids). Noted slower A-P transfer of bolus material and slight-min oral residue post swallow, but w/ verbal cues to use lingual sweep and f/u swallow, pt was able to clear his mouth. When trials of Dysphagia Level 2 foods (well-chopped) were attempted, pt exhibited increased mastication time w/ increased pocketing on the Left side of the trials. Pt was able to use lingual/finger sweeping and alternated food trials w/ liquid sips to aid clearing. Suspect the increased oral phase deficits were related to the increased texture of the foods and the increased oral management needs.  Noted intermittent, delayed throat clearing/coughing w/ a min wet vocal quality w/ po intake. Pt appeared less sensate to the increased (suspected) pharyngeal residue and oral residue (Left cheek) thus requiring verbal cues for follow through w/ strategies and precautions  recommended, especially the instruction on using a min LEFT Head Turn and Look Down to aid swallowing; f/u, effortful swallow to clear fully.  Due to pt's moderate desire for an increased textured diet ("real food" - not all purees), MD consulted and agreed w/ trial upgrade to a Dysphagia Level 2, Nectar liquids. Due to pt's continued increased risk for aspiration at this time, recommend 100% monitoring at all meals; strict aspiration precautions and strategies to include a Head Turn to the Left w/ min Look Down; f/u, dry swallows; lingual/finger sweeping to clear Left cheek. NSG instructed to monitor and hold po's if pt begins to exhibit increased s/s of aspiration during any oral intake/meal. Family in room (Sister)/pt updated; signage posted in room. Recommend challenging pt on his LEFT side as pt appears to present w/ Left Neglect. Pt is favoring his Right side w/ gaze preference but is able to turn head to the Left when verbally cued though it is slow and incomplete. Family educated on this. Noted Dysarthria is slightly improved. Recommend continued ST services f/u for Dysphagia, Dysarthria, Education.      HPI HPI: Pt is a 82 y.o. male with a known history of HTN, HLP and prostate cancer. presents with left sided weakness associated with dysarthria and right sided gaze palsy and left facial droop that a was noted upon awakening from sleep around 4 AM. He was normal last night. The patient denies any syncope, loss of consciousness, focal weakness or incontinence. CT head shows right parietal lobe peripheral CVA. Significant Left oral-facial weakness noted; Dysarthria; anterior/Left drooling noted at rest.       SLP Plan  Continue with current plan of care  Recommendations  Diet recommendations: Dysphagia 2 (fine chop);Dysphagia 1 (puree);Nectar-thick liquid Liquids provided via: Cup;Straw ((straw if no coughing noted)) Medication Administration: Crushed with puree Supervision: Patient able to  self feed;Full supervision/cueing for compensatory strategies Compensations: Minimize environmental distractions;Slow rate;Small sips/bites;Lingual sweep for clearance of pocketing;Monitor for anterior loss;Multiple dry swallows after each bite/sip;Follow solids with liquid;Clear throat intermittently Postural Changes and/or Swallow Maneuvers: Seated upright 90 degrees;Upright 30-60 min after meal                General recommendations:  (Dietician consult) Oral Care Recommendations: Oral care BID;Staff/trained caregiver to provide oral care Follow up Recommendations: Skilled Nursing facility Plan: Continue with current plan of care       [name], [place], CCC-SLP  Juan Mayo 02/25/2016, 2:13 PM

## 2016-02-25 NOTE — Progress Notes (Signed)
Patient discharged via EMS. Colie Josten S, RN  

## 2016-02-25 NOTE — Care Management Important Message (Signed)
Important Message  Patient Details  Name: Juan Mayo MRN: [id number] Date of Birth: [birth year]   Medicare Important Message Given:  Yes    Shelbie Ammons, RN 02/25/2016, 11:40 AM

## 2016-02-28 DIAGNOSIS — E784 Other hyperlipidemia: Secondary | ICD-10-CM | POA: Diagnosis not present

## 2016-02-28 DIAGNOSIS — I10 Essential (primary) hypertension: Secondary | ICD-10-CM | POA: Diagnosis not present

## 2016-02-28 DIAGNOSIS — I6350 Cerebral infarction due to unspecified occlusion or stenosis of unspecified cerebral artery: Secondary | ICD-10-CM | POA: Diagnosis not present

## 2016-02-28 DIAGNOSIS — K5900 Constipation, unspecified: Secondary | ICD-10-CM | POA: Diagnosis not present

## 2016-02-29 DIAGNOSIS — E784 Other hyperlipidemia: Secondary | ICD-10-CM | POA: Diagnosis not present

## 2016-02-29 DIAGNOSIS — R1312 Dysphagia, oropharyngeal phase: Secondary | ICD-10-CM | POA: Diagnosis not present

## 2016-02-29 DIAGNOSIS — G464 Cerebellar stroke syndrome: Secondary | ICD-10-CM | POA: Diagnosis not present

## 2016-02-29 DIAGNOSIS — K5909 Other constipation: Secondary | ICD-10-CM | POA: Diagnosis not present

## 2016-02-29 DIAGNOSIS — M6281 Muscle weakness (generalized): Secondary | ICD-10-CM | POA: Diagnosis not present

## 2016-02-29 DIAGNOSIS — I10 Essential (primary) hypertension: Secondary | ICD-10-CM | POA: Diagnosis not present

## 2016-02-29 DIAGNOSIS — R41841 Cognitive communication deficit: Secondary | ICD-10-CM | POA: Diagnosis not present

## 2016-02-29 DIAGNOSIS — Z5189 Encounter for other specified aftercare: Secondary | ICD-10-CM | POA: Diagnosis not present

## 2016-02-29 DIAGNOSIS — R2689 Other abnormalities of gait and mobility: Secondary | ICD-10-CM | POA: Diagnosis not present

## 2016-03-05 DIAGNOSIS — K5900 Constipation, unspecified: Secondary | ICD-10-CM | POA: Diagnosis not present

## 2016-03-05 DIAGNOSIS — I6350 Cerebral infarction due to unspecified occlusion or stenosis of unspecified cerebral artery: Secondary | ICD-10-CM | POA: Diagnosis not present

## 2016-03-05 DIAGNOSIS — I10 Essential (primary) hypertension: Secondary | ICD-10-CM | POA: Diagnosis not present

## 2016-03-05 DIAGNOSIS — R0782 Intercostal pain: Secondary | ICD-10-CM | POA: Diagnosis not present

## 2016-03-06 ENCOUNTER — Other Ambulatory Visit: Payer: Self-pay | Admitting: *Deleted

## 2016-03-06 NOTE — Patient Outreach (Signed)
[place] [facility]) Care Management  03/06/2016  Juan Mayo 18-Apr-1921 [id number]   Spoke with patient at bedside regarding [facility] program services.  Patient reports he is independent, has a brother and sister who assist but his wife is deceased and he has no children.  Patient states he is still able to push mow his lawn.  He states he is working on swallowing at this time he is on special diet and thick liquids. No discharge date set as of yet.  Plan to check in with facility discharge planner and follow for discharge needs.  Royetta Crochet. Laymond Purser, RN, BSN, [employer] [phone]) Business Cell  [phone]) Toll Free Office

## 2016-03-13 ENCOUNTER — Other Ambulatory Visit: Payer: Self-pay | Admitting: *Deleted

## 2016-03-13 DIAGNOSIS — I6789 Other cerebrovascular disease: Secondary | ICD-10-CM

## 2016-03-13 NOTE — Patient Outreach (Signed)
[name]  03/13/2016  Juan Mayo 1922-01-19 [id number]   Met with patient, his sister and POA, Juan Mayo and his brother at facility. Discussed [facility] program services, reviewed difference in Care management and home care.  Patient states he is planning to go home, he was "cut by the insurance" but they have appealed.  Concerns include special diet, sister worried about patient living alone and asking about support systems. Patient was independent with everything, even cut his own lawn with walk behind mower, before recent stroke. Patient and family agree to [facility] program services. Consent obtained.  Met with Juan Mayo, SW at facility, he states that LCD is today and discharge 12/15, they are due to hear from appeal by 5pm.  Discussed that patient agrees to [facility] program services.   Plan: Send referral order for [facility] community for [facility] Send referral order for LCSW to assist with community resources.  Discharge planner at facility to notify this [facility] of appeal results and definitive discharge plan.  Juan Mayo. Juan Purser, RN, BSN, [place] [facility] Care Coordinator [phone]

## 2016-03-14 ENCOUNTER — Other Ambulatory Visit: Payer: Self-pay | Admitting: *Deleted

## 2016-03-14 NOTE — Patient Outreach (Signed)
[facility]) Care Management  03/14/2016  Juan Mayo 14-Dec-1921 [id number]   Message left this am with LCSW re: discharge and appeal, requested call back.  No return call from LCSW, Call to patient sister and POA to check on appeal, she states they have not heard about appeal. Discussed to expect a call from community nurse and SW next week regarding TOC.  Plan to sign off unless discharge needs change.  Will report to [facility] and LCSW. Royetta Crochet. Juan Purser, RN, BSN, [place] [facility] Care Coordinator [phone]

## 2016-03-18 ENCOUNTER — Emergency Department: Payer: PPO

## 2016-03-18 ENCOUNTER — Emergency Department
Admission: EM | Admit: 2016-03-18 | Discharge: 2016-03-18 | Disposition: A | Discharge status: Home / Self Care | Payer: PPO | Attending: Emergency Medicine | Admitting: Emergency Medicine

## 2016-03-18 ENCOUNTER — Other Ambulatory Visit: Payer: Self-pay | Admitting: *Deleted

## 2016-03-18 DIAGNOSIS — Z87891 Personal history of nicotine dependence: Secondary | ICD-10-CM | POA: Diagnosis not present

## 2016-03-18 DIAGNOSIS — R1011 Right upper quadrant pain: Secondary | ICD-10-CM | POA: Insufficient documentation

## 2016-03-18 DIAGNOSIS — Z8546 Personal history of malignant neoplasm of prostate: Secondary | ICD-10-CM | POA: Insufficient documentation

## 2016-03-18 DIAGNOSIS — Z8673 Personal history of transient ischemic attack (TIA), and cerebral infarction without residual deficits: Secondary | ICD-10-CM | POA: Diagnosis not present

## 2016-03-18 DIAGNOSIS — R0781 Pleurodynia: Secondary | ICD-10-CM | POA: Diagnosis not present

## 2016-03-18 DIAGNOSIS — R634 Abnormal weight loss: Secondary | ICD-10-CM | POA: Diagnosis not present

## 2016-03-18 DIAGNOSIS — R5383 Other fatigue: Secondary | ICD-10-CM | POA: Diagnosis not present

## 2016-03-18 DIAGNOSIS — Z79899 Other long term (current) drug therapy: Secondary | ICD-10-CM | POA: Insufficient documentation

## 2016-03-18 DIAGNOSIS — I25810 Atherosclerosis of coronary artery bypass graft(s) without angina pectoris: Secondary | ICD-10-CM | POA: Diagnosis not present

## 2016-03-18 LAB — COMPREHENSIVE METABOLIC PANEL
ALBUMIN: 4.1 g/dL (ref 3.5–5.0)
ALK PHOS: 92 U/L (ref 38–126)
ALT: 13 U/L — AB (ref 17–63)
AST: 21 U/L (ref 15–41)
Anion gap: 10 (ref 5–15)
BILIRUBIN TOTAL: 0.9 mg/dL (ref 0.3–1.2)
BUN: 29 mg/dL — AB (ref 6–20)
CALCIUM: 9.2 mg/dL (ref 8.9–10.3)
CO2: 29 mmol/L (ref 22–32)
Chloride: 108 mmol/L (ref 101–111)
Creatinine, Ser: 0.89 mg/dL (ref 0.61–1.24)
GFR calc Af Amer: >60 mL/min (ref >60)
GFR calc non Af Amer: >60 mL/min (ref >60)
GLUCOSE: 81 mg/dL (ref 65–99)
Potassium: 4.3 mmol/L (ref 3.5–5.1)
SODIUM: 147 mmol/L — AB (ref 135–145)
TOTAL PROTEIN: 7.4 g/dL (ref 6.5–8.1)

## 2016-03-18 LAB — CBC
HEMATOCRIT: 41.6 % (ref 40.0–52.0)
Hemoglobin: 14.3 g/dL (ref 13.0–18.0)
MCH: 30.8 pg (ref 26.0–34.0)
MCHC: 34.4 g/dL (ref 32.0–36.0)
MCV: 89.5 fL (ref 80.0–100.0)
Platelets: 234 10*3/uL (ref 150–440)
RBC: 4.65 MIL/uL (ref 4.40–5.90)
RDW: 13.2 % (ref 11.5–14.5)
WBC: 5.5 10*3/uL (ref 3.8–10.6)

## 2016-03-18 LAB — LIPASE, BLOOD: Lipase: 35 U/L (ref 11–51)

## 2016-03-18 MED ORDER — SODIUM CHLORIDE 0.9 % IV SOLN
Freq: Once | INTRAVENOUS | Status: AC
  Administered 2016-03-18: 17:00:00 via INTRAVENOUS

## 2016-03-18 NOTE — ED Provider Notes (Signed)
[facility] Emergency Department Provider Note  Time seen: 6:35 PM  I have reviewed the triage vital signs and the nursing notes.   HISTORY  Chief Complaint Abdominal Pain; Fatigue; and Dysphagia    HPI Juan Mayo is a 82 y.o. male with a past medical history of hyperlipidemia, prostate cancer, CVA in November, who presents the emergency department right upper quadrant pain. According to the patient and family he has been home for approximately one week from rehabilitation. He has been experiencing right upper quadrant discomfort. He states is not really a "pain" is more of a chronic discomfort. Denies any worsening with change of position or with eating. Denies any fever, cough or congestion. Family states they have noticed him looking a little more short of breath since getting home from rehabilitation and states he has lost a considerable amount of weight over the past 1 month as well. Currently the patient appears well, states very minimal right upper quadrant pain.  Past Medical History:  Diagnosis Date  . Hypercholesteremia   . Neuropathy ([place])   . Prostate cancer [facility])     Patient Active Problem List   Diagnosis Date Noted  . CVA (cerebral vascular accident) ([place]) 02/23/2016  . Hip fracture ([place]) 03/18/2015    Past Surgical History:  Procedure Laterality Date  . AORTIC VALVE REPLACEMENT    . BACK SURGERY    . CORONARY ARTERY BYPASS GRAFT    . FRACTURE SURGERY Right   . INTRAMEDULLARY (IM) NAIL INTERTROCHANTERIC Right 03/18/2015   Procedure: INTRAMEDULLARY (IM) NAIL INTERTROCHANTRIC;  Surgeon: [name], MD;  Location: [facility] ORS;  Service: Orthopedics;  Laterality: Right;  . PROSTATECTOMY    . REPLACEMENT TOTAL KNEE BILATERAL      Prior to Admission medications   Medication Sig Start Date End Date Taking? Authorizing Provider  atorvastatin (LIPITOR) 40 MG tablet Take 1 tablet (40 mg total) by mouth daily at 6 PM. 02/25/16   Epifanio Lesches, MD  clopidogrel (PLAVIX) 75 MG tablet Take 1 tablet (75 mg total) by mouth daily. 02/26/16   Epifanio Lesches, MD  metoprolol tartrate (LOPRESSOR) 25 MG tablet Take 25 mg by mouth 2 (two) times daily.    Historical Provider, MD  mouth rinse LIQD solution 15 mLs by Mouth Rinse route 2 (two) times daily. 02/25/16   Epifanio Lesches, MD  senna-docusate (SENOKOT-S) 8.6-50 MG tablet Take 1 tablet by mouth at bedtime as needed for mild constipation. 02/25/16   Epifanio Lesches, MD    No Known Allergies  Family History  Problem Relation Age of Onset  . Hypertension Other     Social History Social History  Substance Use Topics  . Smoking status: Former Research scientist (life sciences)  . Smokeless tobacco: Never Used  . Alcohol use No    Review of Systems Constitutional: Negative for fever. Cardiovascular: Negative for chest pain. Respiratory: Negative for shortness of breath. Gastrointestinal: Right upper quadrant discomfort, negative for nausea, vomiting, diarrhea Neurological: Negative for headache 10-point ROS otherwise negative.  ____________________________________________   PHYSICAL EXAM:  VITAL SIGNS: ED Triage Vitals  Enc Vitals Group     BP 03/18/16 1647 118/66     Pulse Rate 03/18/16 1647 72     Resp 03/18/16 1647 18     Temp 03/18/16 1647 97.5 F (36.4 C)     Temp Source 03/18/16 1647 Oral     SpO2 03/18/16 1647 98 %     Weight 03/18/16 1648 103 lb 9.6 oz (47 kg)  Height 03/18/16 1648 5\' 7"  (1.702 m)     Head Circumference --      [facility] Flow --      Pain Score 03/18/16 1735 0     Pain Loc --      Pain Edu? --      Excl. in [place]? --     Constitutional: Alert and oriented. Well appearing and in no distress. Eyes: Normal exam ENT   Head: Normocephalic and atraumatic.   Mouth/Throat: Mucous membranes are moist. Cardiovascular: Normal rate, regular rhythm. No murmur Respiratory: Normal respiratory effort without tachypnea nor retractions. Breath sounds are  clear Gastrointestinal: Soft and nontender. No distention.   Musculoskeletal: Nontender with normal range of motion in all extremities Neurologic:  Normal speech and language. No gross focal neurologic deficits Skin:  Skin is warm, dry and intact.  Psychiatric: Mood and affect are normal.   ____________________________________________     RADIOLOGY  Chest x-ray negative.   ____________________________________________   INITIAL IMPRESSION / ASSESSMENT AND PLAN / ED COURSE  Pertinent labs & imaging results that were available during my care of the patient were reviewed by me and considered in my medical decision making (see chart for details).  Patient presents the emergency department with right upper quadrant pain for the past month, states it is more of a discomfort than a pain and is fairly constant. Denies any changes with position or with eating. Family also concerned that the patient may be dehydrated, he is on a thickened liquid diet and has not been eating or drinking much. Currently the patient appears well, no distress, has no complaints besides very mild right upper quadrant pain which she states is largely unchanged. Otherwise largely negative review of systems, no nausea, vomiting, diarrhea. Patient's labs are largely within normal limits besides a mild hypernatremia likely due to dehydration. We will dose IV fluids. We'll obtain an ultrasound of the right upper quadrant as well as a chest x-ray to further evaluate. Patient agreeable.  Chest x-ray and ultrasound are negative. Patient will be discharged home with PCP follow-up.  ____________________________________________   FINAL CLINICAL IMPRESSION(S) / ED DIAGNOSES  Right upper quadrant pain    Harvest Dark, MD 03/18/16 [id number]

## 2016-03-18 NOTE — Patient Outreach (Signed)
Attempt made to contact pt, follow up on referral from [facility] post  acute coordinator to follow pt for transition of care.  Recent hospitalization 11/25-11/27 for CVA, then discharged to [facility].  Was informed by [facility]  Post acute coordinator possible discharge was 12/15 to which pt has appealed and as of 12/15- no word on appeal.  HIPAA compliant voice message left with contact name and number.     Plan: RN CM to follow up with [facility] CSW.     Zara Chess.   [place] Care Management  [phone]

## 2016-03-18 NOTE — ED Notes (Signed)
Per family pt had recent stroke and was attending rehab , was sent home last week and c/o decreased PO intake with weakness. Pt A&OX4. Pt denies any pain at this time time

## 2016-03-18 NOTE — ED Triage Notes (Signed)
Brought over from [facility] with possible dehydration  Hx of recent cva

## 2016-03-18 NOTE — ED Triage Notes (Addendum)
Pt arrives here from home with reports of RUQ abdominal pain that has been ongoing for at least one week  "under my ribs"  Pt returned home from [facility] after a CVA  He has been home approx 1 week and he has been hurting ever since  Pt also reports increased fatigue  Dysphagia present since CVA thickened liquid in use

## 2016-03-19 ENCOUNTER — Other Ambulatory Visit: Payer: Self-pay | Admitting: *Deleted

## 2016-03-19 NOTE — Patient Outreach (Addendum)
Transition of care call successful, follow up on referral from [facility] post acute care  coordinator to follow pt for transition of care.  Recent hospitalization 11/25-11/27 for CVA, then discharged to [facility].   Spoke with pt, HIPAA/identity verified, discussed purpose of call- follow up post discharge. Pt  reports he discharged from [facility] 12/16, went to ED yesterday(RUQ pain)- was told did not have enough fluid in his body.  Pt reports the stroke affected his swallowing, does not  eat very much.  Pt reports his diet consists of  oatmeal,soup,yogurt, water/cranberry juice doing sips.    Pt reports he lives alone, friend Hedy Camara currently present with pt has been staying over night, plans to stay one more night.  Pt reports he is going to try and get more help this week,knows a lady that said she could stay.   Pt reports he was doing his medications before the stroke but sister Loni Beckwith has been coming over daily to give him his medications.   RN CM discussed with pt the  [facility] transition of care program- follow up 31 days (weekly phone calls, a home visit).   Plan:  As discussed with pt, plan to follow up again next week- initial home visit.            Plan to inform Dr. Ola Spurr of [facility] involvement- fax barrier letter.    Zara Chess.   [place] Care Management  [phone]

## 2016-03-20 ENCOUNTER — Encounter: Payer: Self-pay | Admitting: *Deleted

## 2016-03-21 ENCOUNTER — Other Ambulatory Visit: Payer: Self-pay | Admitting: *Deleted

## 2016-03-21 DIAGNOSIS — E785 Hyperlipidemia, unspecified: Secondary | ICD-10-CM | POA: Diagnosis not present

## 2016-03-21 DIAGNOSIS — I69392 Facial weakness following cerebral infarction: Secondary | ICD-10-CM | POA: Diagnosis not present

## 2016-03-21 DIAGNOSIS — Z7902 Long term (current) use of antithrombotics/antiplatelets: Secondary | ICD-10-CM | POA: Diagnosis not present

## 2016-03-21 DIAGNOSIS — I509 Heart failure, unspecified: Secondary | ICD-10-CM | POA: Diagnosis not present

## 2016-03-21 DIAGNOSIS — G629 Polyneuropathy, unspecified: Secondary | ICD-10-CM | POA: Diagnosis not present

## 2016-03-21 DIAGNOSIS — I69322 Dysarthria following cerebral infarction: Secondary | ICD-10-CM | POA: Diagnosis not present

## 2016-03-21 DIAGNOSIS — Z8546 Personal history of malignant neoplasm of prostate: Secondary | ICD-10-CM | POA: Diagnosis not present

## 2016-03-21 DIAGNOSIS — I69354 Hemiplegia and hemiparesis following cerebral infarction affecting left non-dominant side: Secondary | ICD-10-CM | POA: Diagnosis not present

## 2016-03-21 DIAGNOSIS — I110 Hypertensive heart disease with heart failure: Secondary | ICD-10-CM | POA: Diagnosis not present

## 2016-03-21 NOTE — Patient Outreach (Signed)
[facility]) Care Management  03/21/2016  Juan Mayo Aug 29, 1921 [id number]   Phone call to patient's sister with the verbal permission of patient to discuss personal cafre services.  [facility] care management discussed.  Per patient's sister, she along with her niece and brother are taking turns staying with patient, however she is not aware of how long they will be able to continue this schedule.  Patient's sister willing to discuss alternative options.   Plan: This social worker will call back within 7 business days to review list of private duty agencies with her.   Sheralyn Boatman [facility] Care Management [phone]

## 2016-03-21 NOTE — Patient Outreach (Addendum)
[place] [facility] [facility]) Care Management  03/21/2016  GABREIL KILNER [birth year] [id number]   Phone call to patient to assess for community resource needs. Per patient, he has [facility] services with [place] and is not sure if he will need assistance at this time.  This social worker explained that [facility] will provide services for a limited amount of time. Patient requested that I speak with his sister regarding these resources as she is his POA.   Plan:  This social worker will give patient's sister a call with 7 business days to provide [employer] information for private duty assistance.   Sheralyn Boatman [facility] Care Management [phone]

## 2016-03-25 DIAGNOSIS — I69392 Facial weakness following cerebral infarction: Secondary | ICD-10-CM | POA: Diagnosis not present

## 2016-03-25 DIAGNOSIS — I509 Heart failure, unspecified: Secondary | ICD-10-CM | POA: Diagnosis not present

## 2016-03-25 DIAGNOSIS — Z7902 Long term (current) use of antithrombotics/antiplatelets: Secondary | ICD-10-CM | POA: Diagnosis not present

## 2016-03-25 DIAGNOSIS — G629 Polyneuropathy, unspecified: Secondary | ICD-10-CM | POA: Diagnosis not present

## 2016-03-25 DIAGNOSIS — I69354 Hemiplegia and hemiparesis following cerebral infarction affecting left non-dominant side: Secondary | ICD-10-CM | POA: Diagnosis not present

## 2016-03-25 DIAGNOSIS — Z8546 Personal history of malignant neoplasm of prostate: Secondary | ICD-10-CM | POA: Diagnosis not present

## 2016-03-25 DIAGNOSIS — E785 Hyperlipidemia, unspecified: Secondary | ICD-10-CM | POA: Diagnosis not present

## 2016-03-25 DIAGNOSIS — I110 Hypertensive heart disease with heart failure: Secondary | ICD-10-CM | POA: Diagnosis not present

## 2016-03-25 DIAGNOSIS — I69322 Dysarthria following cerebral infarction: Secondary | ICD-10-CM | POA: Diagnosis not present

## 2016-03-26 DIAGNOSIS — I110 Hypertensive heart disease with heart failure: Secondary | ICD-10-CM | POA: Diagnosis not present

## 2016-03-26 DIAGNOSIS — G629 Polyneuropathy, unspecified: Secondary | ICD-10-CM | POA: Diagnosis not present

## 2016-03-26 DIAGNOSIS — I69392 Facial weakness following cerebral infarction: Secondary | ICD-10-CM | POA: Diagnosis not present

## 2016-03-26 DIAGNOSIS — E785 Hyperlipidemia, unspecified: Secondary | ICD-10-CM | POA: Diagnosis not present

## 2016-03-26 DIAGNOSIS — Z7902 Long term (current) use of antithrombotics/antiplatelets: Secondary | ICD-10-CM | POA: Diagnosis not present

## 2016-03-26 DIAGNOSIS — Z8546 Personal history of malignant neoplasm of prostate: Secondary | ICD-10-CM | POA: Diagnosis not present

## 2016-03-26 DIAGNOSIS — I69322 Dysarthria following cerebral infarction: Secondary | ICD-10-CM | POA: Diagnosis not present

## 2016-03-26 DIAGNOSIS — I509 Heart failure, unspecified: Secondary | ICD-10-CM | POA: Diagnosis not present

## 2016-03-26 DIAGNOSIS — I69354 Hemiplegia and hemiparesis following cerebral infarction affecting left non-dominant side: Secondary | ICD-10-CM | POA: Diagnosis not present

## 2016-03-27 ENCOUNTER — Encounter: Payer: Self-pay | Admitting: *Deleted

## 2016-03-27 ENCOUNTER — Other Ambulatory Visit: Payer: Self-pay | Admitting: *Deleted

## 2016-03-27 NOTE — Patient Outreach (Signed)
[place] [facility]) Care Management   03/27/2016  DELIA BEDILLION 16-Dec-1921 [id number]  Juan Mayo is an 82 y.o. male  Subjective:  Pt reports been drooling  since discharge from rehab, appetite better, has  Yogurt/cottage cheese/cube steak and  last recorded weight was 100.3 lbs.  Pt reports both  Eyes were effected by stroke because prior to stroke saw Eye MD and was told no changes  Then.   Pt reports he has been doing his stationary bike for 30 minutes a day.   Objective:   Vitals:   03/27/16 1155  BP: 128/64  Pulse: 67  Resp: 20    ROS  Physical Exam  Constitutional: He is oriented to person, place, and time.  Frail, thin.   Cardiovascular: Normal rate.   Irregular rhythm noted   Respiratory: Effort normal and breath sounds normal.  GI: Soft. Bowel sounds are normal.  Musculoskeletal: Normal range of motion. He exhibits no edema.  Uses walker as needed.   Neurological: He is alert and oriented to person, place, and time.  Skin: Skin is warm and dry.  Psychiatric: He has a normal mood and affect. His behavior is normal. Judgment and thought content normal.    Encounter Medications:  Patient was recently discharged from hospital and all medications have been reviewed. Outpatient Encounter Prescriptions as of 03/27/2016  Medication Sig  . atorvastatin (LIPITOR) 40 MG tablet Take 1 tablet (40 mg total) by mouth daily at 6 PM.  . clopidogrel (PLAVIX) 75 MG tablet Take 1 tablet (75 mg total) by mouth daily.  . metoprolol tartrate (LOPRESSOR) 25 MG tablet Take 25 mg by mouth 2 (two) times daily.  . Multiple Vitamins-Minerals (CENTRUM SILVER) tablet Take 1 tablet by mouth daily.  Marland Kitchen mouth rinse LIQD solution 15 mLs by Mouth Rinse route 2 (two) times daily. (Patient not taking: Reported on 03/27/2016)  . senna-docusate (SENOKOT-S) 8.6-50 MG tablet Take 1 tablet by mouth at bedtime as needed for mild constipation. (Patient not taking: Reported on 03/27/2016)   No  facility-administered encounter medications on file as of 03/27/2016.     Functional Status:   In your present state of health, do you have any difficulty performing the following activities: 03/27/2016 02/23/2016  Hearing? Y N  Vision? Y Y  Difficulty concentrating or making decisions? N N  Walking or climbing stairs? N Y  Dressing or bathing? N N  Doing errands, shopping? Tempie Donning  Preparing Food and eating ? N -  Using the Toilet? N -  In the past six months, have you accidently leaked urine? Y -  Do you have problems with loss of bowel control? N -  Managing your Medications? Y -  Managing your Finances? Y -  Housekeeping or managing your Housekeeping? Y -  Some recent data might be hidden    Fall/Depression Screening:    PHQ 2/9 Scores 03/27/2016  PHQ - 2 Score 0    Assessment:  Pleasant 82 year old gentleman, lives alone.  Niece Mechele Claude present during home visit. Also [place] RN from [facility] finishing up assessment upon my arrival.      Recent CVA:  Drooping noted near left side of lip.   No c/o sob, pain.   Ambulated with walker no        problems.  Emmi information on Preventing second stroke- know the signs of stroke (provided/       Reviewed with pt.     Nutrition:  Thin,  frail looking.  Low BMI.   Discussed with small frequent meals that include protein,         Whole grains, nutritional supplements.    Plan: RN CM to continue to follow pt for transition of care, follow up again next week telephonically.           Plan to fax  Dr. Adrian Prows 12/28 home visit encounter.               [facility] CM Care Plan Problem One   Flowsheet Row Most Recent Value  Care Plan Problem One  Risk for readmission related to SNF discharge, recent hospitalization for CVA   Role Documenting the Problem One  Care Management Coordinator  Care Plan for Problem One  Active  [facility] Long Term Goal (31-90 days)  Pt would not readmit within the next 31 days   [facility]N Long Term Goal Start Date   03/19/16  Interventions for Problem One Long Term Goal  Provided/reviewed with pt Emmi information - Preventing second stroke, know the signs of stroke   THN CM Short Term Goal #1 (0-30 days)  Pt's nutritional status would improve within the next 30 days   THN CM Short Term Goal #1 Start Date  03/19/16  Interventions for Short Term Goal #1  Discussed with pt adding protein with meals, small frequent meals.   THN CM Short Term Goal #2 (0-30 days)  Pt would have no issues with hydration in the next 30 days   THN CM Short Term Goal #2 Start Date  03/19/16  Interventions for Short Term Goal #2  Discussed with pt importance of hydration,continue with sips,add thick it as needed.- 12/20     Zara Chess.   [place] Care Management  [phone]

## 2016-04-02 ENCOUNTER — Other Ambulatory Visit: Payer: Self-pay | Admitting: *Deleted

## 2016-04-02 ENCOUNTER — Encounter: Payer: Self-pay | Admitting: *Deleted

## 2016-04-02 DIAGNOSIS — I69354 Hemiplegia and hemiparesis following cerebral infarction affecting left non-dominant side: Secondary | ICD-10-CM | POA: Diagnosis not present

## 2016-04-02 DIAGNOSIS — I69322 Dysarthria following cerebral infarction: Secondary | ICD-10-CM | POA: Diagnosis not present

## 2016-04-02 DIAGNOSIS — Z7902 Long term (current) use of antithrombotics/antiplatelets: Secondary | ICD-10-CM | POA: Diagnosis not present

## 2016-04-02 DIAGNOSIS — E785 Hyperlipidemia, unspecified: Secondary | ICD-10-CM | POA: Diagnosis not present

## 2016-04-02 DIAGNOSIS — I69392 Facial weakness following cerebral infarction: Secondary | ICD-10-CM | POA: Diagnosis not present

## 2016-04-02 DIAGNOSIS — I509 Heart failure, unspecified: Secondary | ICD-10-CM | POA: Diagnosis not present

## 2016-04-02 DIAGNOSIS — G629 Polyneuropathy, unspecified: Secondary | ICD-10-CM | POA: Diagnosis not present

## 2016-04-02 DIAGNOSIS — I110 Hypertensive heart disease with heart failure: Secondary | ICD-10-CM | POA: Diagnosis not present

## 2016-04-02 DIAGNOSIS — Z8546 Personal history of malignant neoplasm of prostate: Secondary | ICD-10-CM | POA: Diagnosis not present

## 2016-04-02 NOTE — Patient Outreach (Addendum)
Juan Mayo [facility]) Care Management  04/02/2016  Juan Mayo May 16, 1921 [id number]   Phone call to patient's sister with patient's sister Juan Mayo on [facility] consent form to discussed community resource needs. Patient was referred by post acute RN to provide patient's family with resources for private duty in home assistance.  Per Juan Mayo, patient's niece is staying with patient at this time, however would like resources for private duty agencies just in case patient may need it in the future.  Per patient's sister, he does not need it right now but would like to be prepared. Juan Mayo verbalized having no additional social work needs at this time.  Resources will be set to patient's home by sister's request.  Case to be closed at this time to social work.  [facility] RNCM and patient's provider to be notified of community resources mailed to patient and of case closure.   Juan Mayo [facility] Care Management [phone]

## 2016-04-03 ENCOUNTER — Encounter: Payer: Self-pay | Admitting: *Deleted

## 2016-04-03 ENCOUNTER — Other Ambulatory Visit: Payer: Self-pay | Admitting: *Deleted

## 2016-04-03 NOTE — Patient Outreach (Signed)
Transition of care call completed, ongoing follow up on referral from [facility] post acute care coordinator - follow pt for transition of care.  Pt discharged from  [facility] 12/16- rehab following recent hospitalization  11/25-11/27 for CVA.  Spoke with pt, HIPAA/identity  Verified.  Pt reports nurses [facility]) came out this week, [place] PT came yesterday- doing well with therapy.   Pt reports going on his stationary bike 30 minutes a day.  Pt reports still having issues with his saliva glands (drooling), doing facial exercises, told it would take awhile.  Pt reports his niece has been staying with him.   Pt reports appetite is good, gained weight, went from 116 lbs to 120 lbs.     Plan:  As discussed with pt, plan to follow up again next week telephonically (part of ongoing transition of care).   Zara Chess.   [place] Care Management  [phone]

## 2016-04-09 DIAGNOSIS — I350 Nonrheumatic aortic (valve) stenosis: Secondary | ICD-10-CM | POA: Diagnosis not present

## 2016-04-09 DIAGNOSIS — E782 Mixed hyperlipidemia: Secondary | ICD-10-CM | POA: Diagnosis not present

## 2016-04-09 DIAGNOSIS — I639 Cerebral infarction, unspecified: Secondary | ICD-10-CM | POA: Diagnosis not present

## 2016-04-09 DIAGNOSIS — I25810 Atherosclerosis of coronary artery bypass graft(s) without angina pectoris: Secondary | ICD-10-CM | POA: Diagnosis not present

## 2016-04-10 ENCOUNTER — Other Ambulatory Visit: Payer: Self-pay | Admitting: *Deleted

## 2016-04-10 DIAGNOSIS — Z7902 Long term (current) use of antithrombotics/antiplatelets: Secondary | ICD-10-CM | POA: Diagnosis not present

## 2016-04-10 DIAGNOSIS — E785 Hyperlipidemia, unspecified: Secondary | ICD-10-CM | POA: Diagnosis not present

## 2016-04-10 DIAGNOSIS — I69392 Facial weakness following cerebral infarction: Secondary | ICD-10-CM | POA: Diagnosis not present

## 2016-04-10 DIAGNOSIS — I69322 Dysarthria following cerebral infarction: Secondary | ICD-10-CM | POA: Diagnosis not present

## 2016-04-10 DIAGNOSIS — I69354 Hemiplegia and hemiparesis following cerebral infarction affecting left non-dominant side: Secondary | ICD-10-CM | POA: Diagnosis not present

## 2016-04-10 DIAGNOSIS — G629 Polyneuropathy, unspecified: Secondary | ICD-10-CM | POA: Diagnosis not present

## 2016-04-10 DIAGNOSIS — I509 Heart failure, unspecified: Secondary | ICD-10-CM | POA: Diagnosis not present

## 2016-04-10 DIAGNOSIS — Z8546 Personal history of malignant neoplasm of prostate: Secondary | ICD-10-CM | POA: Diagnosis not present

## 2016-04-10 DIAGNOSIS — I110 Hypertensive heart disease with heart failure: Secondary | ICD-10-CM | POA: Diagnosis not present

## 2016-04-10 NOTE — Patient Outreach (Signed)
Transition of care call successful,ongoing follow up on recent discharge from [facility] 12/16 for rehab, hospitalization 11/25-11/27  CVA.  Spoke with pt, difficulty understanding RN CM on the phone so  gave phone to niece Mechele Claude.   Spoke with Mechele Claude (on consent form), HIPAA/identity verified on pt.  Niece reports pt went to MD (Dr. Ola Spurr - view in [place]), received a good report.  Niece reports pt was disappointed because MD said he could not drive, would have to take drivers test.   Niece reports pt's appetite is good, current weight 123 lbs, gained 20 lbs since [facility], MD pleased but still wants pt to gain more weight.   Niece reports [place] speech Therapist had her first visit today, more concerned about pt's  swallowing than drooling, did a test with pt which showed swallowing good.  Niece reports [place] to follow up again next week and work on the drooling. Niece reports [place] and [place] PT both to do their  last visit next week.   Niece reports she is staying with pt during the day, 2 brothers takes turns at night and sister does the weekends.    Niece reports pt would probably do okay on his own but likes the company.      Plan:  As discussed with pt's niece, plan to follow up again with pt  next week telephonically.     Zara Chess.   [place] Care Management  [phone]

## 2016-04-14 DIAGNOSIS — I69322 Dysarthria following cerebral infarction: Secondary | ICD-10-CM | POA: Diagnosis not present

## 2016-04-14 DIAGNOSIS — G629 Polyneuropathy, unspecified: Secondary | ICD-10-CM | POA: Diagnosis not present

## 2016-04-14 DIAGNOSIS — I509 Heart failure, unspecified: Secondary | ICD-10-CM | POA: Diagnosis not present

## 2016-04-14 DIAGNOSIS — Z8546 Personal history of malignant neoplasm of prostate: Secondary | ICD-10-CM | POA: Diagnosis not present

## 2016-04-14 DIAGNOSIS — I69354 Hemiplegia and hemiparesis following cerebral infarction affecting left non-dominant side: Secondary | ICD-10-CM | POA: Diagnosis not present

## 2016-04-14 DIAGNOSIS — E785 Hyperlipidemia, unspecified: Secondary | ICD-10-CM | POA: Diagnosis not present

## 2016-04-14 DIAGNOSIS — I69392 Facial weakness following cerebral infarction: Secondary | ICD-10-CM | POA: Diagnosis not present

## 2016-04-14 DIAGNOSIS — I110 Hypertensive heart disease with heart failure: Secondary | ICD-10-CM | POA: Diagnosis not present

## 2016-04-14 DIAGNOSIS — Z7902 Long term (current) use of antithrombotics/antiplatelets: Secondary | ICD-10-CM | POA: Diagnosis not present

## 2016-04-15 DIAGNOSIS — I69392 Facial weakness following cerebral infarction: Secondary | ICD-10-CM | POA: Diagnosis not present

## 2016-04-15 DIAGNOSIS — E785 Hyperlipidemia, unspecified: Secondary | ICD-10-CM | POA: Diagnosis not present

## 2016-04-15 DIAGNOSIS — I110 Hypertensive heart disease with heart failure: Secondary | ICD-10-CM | POA: Diagnosis not present

## 2016-04-15 DIAGNOSIS — Z7902 Long term (current) use of antithrombotics/antiplatelets: Secondary | ICD-10-CM | POA: Diagnosis not present

## 2016-04-15 DIAGNOSIS — G629 Polyneuropathy, unspecified: Secondary | ICD-10-CM | POA: Diagnosis not present

## 2016-04-15 DIAGNOSIS — Z8546 Personal history of malignant neoplasm of prostate: Secondary | ICD-10-CM | POA: Diagnosis not present

## 2016-04-15 DIAGNOSIS — I69354 Hemiplegia and hemiparesis following cerebral infarction affecting left non-dominant side: Secondary | ICD-10-CM | POA: Diagnosis not present

## 2016-04-15 DIAGNOSIS — I509 Heart failure, unspecified: Secondary | ICD-10-CM | POA: Diagnosis not present

## 2016-04-15 DIAGNOSIS — I69322 Dysarthria following cerebral infarction: Secondary | ICD-10-CM | POA: Diagnosis not present

## 2016-04-16 ENCOUNTER — Other Ambulatory Visit: Payer: Self-pay | Admitting: *Deleted

## 2016-04-16 NOTE — Patient Outreach (Signed)
Transition of care call successful, ongoing follow up on recent discharge from [facility] 12/16 (rehab), hospitalization 11/25-11/27 CVA.   Spoke with pt, HIPAA/identity verified, discussed purpose of call.  Pt reports doing good, Home health speech therapist came yesterday, continue to do exercises for the drooling- getting a little better.  Pt reports no problems swallowing, staying hydrated, drinking all kinds of fluids.   Pt reports he is careful to drink/eat small amounts at a time, does not have to use thick it.   Pt reports his niece Mechele Claude is staying with him during the day except today because of the weather.  Pt reports all this week he has been staying by himself at night, brother available to stay if needed.  Pt reports his eyesight is not as good since the stroke, problems reading, due for an Eye exam soon.  RN CM reviewed with pt Emmi information (provided at home visit) on preventing a second stroke, know the signs to which pt was able to state back several signs/symptoms.     Plan:  As discussed with pt, coworker Journalist, newspaper CM covering for this RN CM will do another transition of care call (final) next week.             As discussed with pt, this RN CM will continue to provide pt  with community nurse case management services, follow up again             Two weeks - check on status, schedule home visit to which pt agreed.     Zara Chess.   [place] Care Management  [phone]

## 2016-04-21 ENCOUNTER — Encounter: Payer: Self-pay | Admitting: *Deleted

## 2016-04-21 ENCOUNTER — Other Ambulatory Visit: Payer: Self-pay | Admitting: *Deleted

## 2016-04-21 NOTE — Patient Outreach (Signed)
[facility]) Care Management [place] Telephone Outreach, Transition of Care  04/21/2016  ROMUALD SHA 07/15/1921 [id number]  Successful telephone outreach to Cathlean Cower, 83 y/o male followed by [place] for transition of Care after recent hospitalization November 25-27, 2017 for CVA; patient was discharged to SNF for rehabilitation, and was discharged home from [facility] March 15, 2016 with home health [facility]) services for speech therapy.  HIPAA/ identity verified with patient during phone call today.  Explained to patient that I was calling to follow up on his progress as coverage for his primary [facility] RN CM, Rose Pierzchala.   Patient reports that he had a "good weekend," and denies issues, concerns, or problems today.  Reports that he has continued staying at home alone and "has had no problems."  [place]-- patient reports that [place] completed "last visit last week."  States he is still having issues with drooling, which is "embarassing and upsetting."  States he has continued doing exercises he learned from [place] to improve.  Encouragement provided, advised patient to keep his medical providers updated on his progress, which he agreed to do.  Eye Doc appt-- states planning to attend upcoming eye doctor appointment.  Medications-- states he has all of his medications and is taking all as prescribed.  Denies questions or concerns around his medications.  I confirmed that patient has the main [facility] CM office phone number, and the [facility] CM 24-hour nurse advice phone number should issues arise prior to next scheduled [place] RN CM outreach.  Explained to patient that [facility] RN CM would contact him to schedule next home visit, and he verbalized understanding and agreement.  Plan:  Will update patient's primary [facility] RN CM on successful telephone outreach today.  Oneta Rack, RN, BSN, [employer] [facility] Care Management  [phone]

## 2016-04-29 ENCOUNTER — Other Ambulatory Visit: Payer: Self-pay | Admitting: *Deleted

## 2016-04-29 NOTE — Patient Outreach (Signed)
Updated care plan.    Plan:  RN CM to follow up with pt this week telephonically, check on status (hospitalization 11/25-11/27 for CVA).   Zara Chess.   [place] Care Management  [phone]

## 2016-05-02 ENCOUNTER — Other Ambulatory Visit: Payer: Self-pay | Admitting: *Deleted

## 2016-05-02 NOTE — Patient Outreach (Addendum)
Follow up phone call successful.  Spoke with pt, HIPAA/identity verified, discussed purpose of call (check on status, hospitalization 11/25-11/27 CVA).   Pt reports doing okay, niece comes every day to do housework, does some meals/does some himself, staying by himself at night.   Pt reports appetite is good, current weight 116 lbs, no weight loss since coming home.  Pt reports to see Eye MD 2/13, Heart MD 2/12.  Pt reports drooling bad, still doing exercises (provided by [place]), don't see where helps, embarrassing.  Pt reports [place] stopped coming.   Pt reports at [facility] facility they had a device for face exercises, could not see if it helped as only did it 3 times.   RN CM discussed with pt calling his Primary Care MD about this matter to which he said he would.   Plan:  As discussed with pt, plan to follow up again next month - do home visit.   Zara Chess.   [place] Management  [phone]   2/26- no addendum made.  Zara Chess.   [place] Care Management  [phone]

## 2016-05-09 DIAGNOSIS — L218 Other seborrheic dermatitis: Secondary | ICD-10-CM | POA: Diagnosis not present

## 2016-05-12 DIAGNOSIS — Z961 Presence of intraocular lens: Secondary | ICD-10-CM | POA: Diagnosis not present

## 2016-05-22 DIAGNOSIS — I25810 Atherosclerosis of coronary artery bypass graft(s) without angina pectoris: Secondary | ICD-10-CM | POA: Diagnosis not present

## 2016-05-22 DIAGNOSIS — I63412 Cerebral infarction due to embolism of left middle cerebral artery: Secondary | ICD-10-CM | POA: Diagnosis not present

## 2016-05-22 DIAGNOSIS — I340 Nonrheumatic mitral (valve) insufficiency: Secondary | ICD-10-CM | POA: Diagnosis not present

## 2016-05-22 DIAGNOSIS — R002 Palpitations: Secondary | ICD-10-CM | POA: Diagnosis not present

## 2016-05-22 DIAGNOSIS — I071 Rheumatic tricuspid insufficiency: Secondary | ICD-10-CM | POA: Diagnosis not present

## 2016-05-23 ENCOUNTER — Other Ambulatory Visit: Payer: Self-pay | Admitting: *Deleted

## 2016-05-23 NOTE — Patient Outreach (Signed)
[facility]) Care Management   05/23/2016  EADEN CRADDICK [birth year] [id number]  JAHMEL TINA is an 83 y.o. male  Subjective:  Pt reports followed up with Dr. Nehemiah Massed  yesterday, discussed with MD heart getting out of rhythm to  Which is now wearing a heart monitor, to take off tomorrow.  Pt reports no chest pain, rests when heart starts  Racing, to see MD again 3/7.  Pt reports he continues to  improve from recent stroke, appetite good- last weight was 128 lbs., getting stronger- mowed the grass yesterday. Pt  reports saw Eye MD recently, received a good report  on eyesight (no effect from stroke).   Pt reports swallowing is good, continues to have drooling, exercises HH ST Provided might be helping a little.  Pt reports he is going to quit chewing gum to see if  that helps.    Objective:   ROS  Physical Exam  Constitutional: He is oriented to person, place, and time. He appears well-developed and well-nourished.  Cardiovascular:  Irregular rhythm   Respiratory: Effort normal and breath sounds normal.  GI: Soft. Bowel sounds are normal.  Musculoskeletal: Normal range of motion. He exhibits edema.  +1 pitting edema right lower extremity  Neurological: He is alert and oriented to person, place, and time.  Skin: Skin is warm and dry.  Psychiatric: He has a normal mood and affect. His behavior is normal. Judgment and thought content normal.    Encounter Medications:   Outpatient Encounter Prescriptions as of 05/23/2016  Medication Sig  . atorvastatin (LIPITOR) 40 MG tablet Take 1 tablet (40 mg total) by mouth daily at 6 PM.  . clopidogrel (PLAVIX) 75 MG tablet Take 1 tablet (75 mg total) by mouth daily.  Marland Kitchen gabapentin (NEURONTIN) 600 MG tablet Take 600 mg by mouth 3 (three) times daily. Pt takes 1/2 tablet (300 mg) twice a day  . metoprolol tartrate (LOPRESSOR) 25 MG tablet Take 25 mg by mouth 2 (two) times daily.  . Multiple Vitamins-Minerals (CENTRUM SILVER) tablet  Take 1 tablet by mouth daily.  Marland Kitchen mouth rinse LIQD solution 15 mLs by Mouth Rinse route 2 (two) times daily. (Patient not taking: Reported on 03/27/2016)  . senna-docusate (SENOKOT-S) 8.6-50 MG tablet Take 1 tablet by mouth at bedtime as needed for mild constipation. (Patient not taking: Reported on 03/27/2016)   No facility-administered encounter medications on file as of 05/23/2016.     Functional Status:   In your present state of health, do you have any difficulty performing the following activities: 03/27/2016 02/23/2016  Hearing? Y N  Vision? Y Y  Difficulty concentrating or making decisions? N N  Walking or climbing stairs? N Y  Dressing or bathing? N N  Doing errands, shopping? Tempie Donning  Preparing Food and eating ? N -  Using the Toilet? N -  In the past six months, have you accidently leaked urine? Y -  Do you have problems with loss of bowel control? N -  Managing your Medications? Y -  Managing your Finances? Y -  Housekeeping or managing your Housekeeping? Y -  Some recent data might be hidden    Fall/Depression Screening:    PHQ 2/9 Scores 03/27/2016  PHQ - 2 Score 0    Assessment:  Pleasant 83 year old man, lives alone, family close by/very supportive.   CVA:  Per pt continues with drooling (effect of stroke), doing facial exercises, per pt to try stop  Chewing gum. CAD:  Per pt recent visit with Dr. Nehemiah Massed (heart racing).  Pt has heart monitor on, no c/o chest            Pain.   Edema:  +1 pitting edema right lower leg.   Plan:  Pt to follow up with Dr. Nehemiah Massed 3/8, results of heart monitor.             As discussed with pt, RN CM to follow up again telephonically in 2 weeks, if stable to discharge.    [facility] CM Care Plan Problem One   Flowsheet Row Most Recent Value  Care Plan Problem One  Recent CVA   Role Documenting the Problem One  Care Management Coordinator  Care Plan for Problem One  Active  [facility] Long Term Goal (31-90 days)  re establised- pt  would continue to see improvement from recent CVA within the next 40 days   [facility] Long Term Goal Start Date  05/02/16  Interventions for Problem One Long Term Goal  Reviewed with pt recent appoinement with Eye MD- eyesight good, was not effected by stroke   [facility] CM Short Term Goal #1 (0-30 days)  Pt would see some improvement in drooling within the next 14 days   [facility] CM Short Term Goal #1 Start Date  05/23/16  Interventions for Short Term Goal #1  Reinforced with pt his attempt to quit chewing gum to help decrease drooling (one effect from CVA)      Zara Chess.   [place] Care Management  [phone]

## 2016-05-29 DIAGNOSIS — I4891 Unspecified atrial fibrillation: Secondary | ICD-10-CM | POA: Diagnosis not present

## 2016-06-05 DIAGNOSIS — I471 Supraventricular tachycardia: Secondary | ICD-10-CM | POA: Diagnosis not present

## 2016-06-05 DIAGNOSIS — I63412 Cerebral infarction due to embolism of left middle cerebral artery: Secondary | ICD-10-CM | POA: Diagnosis not present

## 2016-06-05 DIAGNOSIS — I350 Nonrheumatic aortic (valve) stenosis: Secondary | ICD-10-CM | POA: Diagnosis not present

## 2016-06-05 DIAGNOSIS — I25810 Atherosclerosis of coronary artery bypass graft(s) without angina pectoris: Secondary | ICD-10-CM | POA: Diagnosis not present

## 2016-06-06 ENCOUNTER — Other Ambulatory Visit: Payer: Self-pay | Admitting: *Deleted

## 2016-06-06 ENCOUNTER — Encounter: Payer: Self-pay | Admitting: *Deleted

## 2016-06-06 NOTE — Patient Outreach (Signed)
Follow up phone call successful.   Spoke with pt, HIPAA verified.  Pt reports saw Heart MD yesterday for results of heart monitor, was told everything okay, no changes made in anything.  Pt reports  has a history of irregular heart rate.  Pt reports quit chewing gum, did not see an improving in drooling (result of stroke), inquired where to get help to which RN CM suggested calling Primary Care MD.   Pt reports has a little swelling in right leg, down when gets up in the morning, no edema in left leg.  Pt reports no problems staying by himself, has a girl come in/cleans house for him, back to riding his stationary bike.  RN CM discussed with pt plan to discharge from community nurse case management services, no further case management needs - pt made aware can contact this RN CM/[facility] office if needs arise in the future.  Plan:  As discussed with pt, plan to discharge from community nurse case management services- goals met.           Plan to inform Dr. Adrian Prows of case closure- fax case closure letter.           Plan to inform [facility] care management assistant to close case.    Zara Chess.   [place] Care Management  [phone]

## 2016-06-12 DIAGNOSIS — B351 Tinea unguium: Secondary | ICD-10-CM | POA: Diagnosis not present

## 2016-06-12 DIAGNOSIS — M79675 Pain in left toe(s): Secondary | ICD-10-CM | POA: Diagnosis not present

## 2016-06-12 DIAGNOSIS — M79674 Pain in right toe(s): Secondary | ICD-10-CM | POA: Diagnosis not present

## 2016-08-06 DIAGNOSIS — E782 Mixed hyperlipidemia: Secondary | ICD-10-CM | POA: Diagnosis not present

## 2016-08-06 DIAGNOSIS — I63412 Cerebral infarction due to embolism of left middle cerebral artery: Secondary | ICD-10-CM | POA: Diagnosis not present

## 2016-08-06 DIAGNOSIS — I071 Rheumatic tricuspid insufficiency: Secondary | ICD-10-CM | POA: Diagnosis not present

## 2016-08-06 DIAGNOSIS — I25810 Atherosclerosis of coronary artery bypass graft(s) without angina pectoris: Secondary | ICD-10-CM | POA: Diagnosis not present

## 2016-08-06 DIAGNOSIS — I340 Nonrheumatic mitral (valve) insufficiency: Secondary | ICD-10-CM | POA: Diagnosis not present

## 2016-08-06 DIAGNOSIS — I350 Nonrheumatic aortic (valve) stenosis: Secondary | ICD-10-CM | POA: Diagnosis not present

## 2016-08-06 DIAGNOSIS — I493 Ventricular premature depolarization: Secondary | ICD-10-CM | POA: Diagnosis not present

## 2016-10-03 DIAGNOSIS — I25810 Atherosclerosis of coronary artery bypass graft(s) without angina pectoris: Secondary | ICD-10-CM | POA: Diagnosis not present

## 2016-10-03 DIAGNOSIS — I63412 Cerebral infarction due to embolism of left middle cerebral artery: Secondary | ICD-10-CM | POA: Diagnosis not present

## 2016-10-03 DIAGNOSIS — E782 Mixed hyperlipidemia: Secondary | ICD-10-CM | POA: Diagnosis not present

## 2016-11-05 DIAGNOSIS — R001 Bradycardia, unspecified: Secondary | ICD-10-CM | POA: Diagnosis not present

## 2016-11-05 DIAGNOSIS — I071 Rheumatic tricuspid insufficiency: Secondary | ICD-10-CM | POA: Diagnosis not present

## 2016-11-05 DIAGNOSIS — I63412 Cerebral infarction due to embolism of left middle cerebral artery: Secondary | ICD-10-CM | POA: Diagnosis not present

## 2016-11-05 DIAGNOSIS — I25810 Atherosclerosis of coronary artery bypass graft(s) without angina pectoris: Secondary | ICD-10-CM | POA: Diagnosis not present

## 2016-11-05 DIAGNOSIS — E782 Mixed hyperlipidemia: Secondary | ICD-10-CM | POA: Diagnosis not present

## 2016-11-05 DIAGNOSIS — I340 Nonrheumatic mitral (valve) insufficiency: Secondary | ICD-10-CM | POA: Diagnosis not present

## 2016-11-05 DIAGNOSIS — I350 Nonrheumatic aortic (valve) stenosis: Secondary | ICD-10-CM | POA: Diagnosis not present

## 2016-11-21 IMAGING — CR DG WRIST COMPLETE 3+V*R*
5 series · 5 of 5 positions shown · non-contrast
Comparison: None.

CLINICAL DATA: Status post fall.  Severe edema posteriorly.

EXAM:
RIGHT WRIST - COMPLETE 3+ VIEW

[wrist pa (1 of 2)]
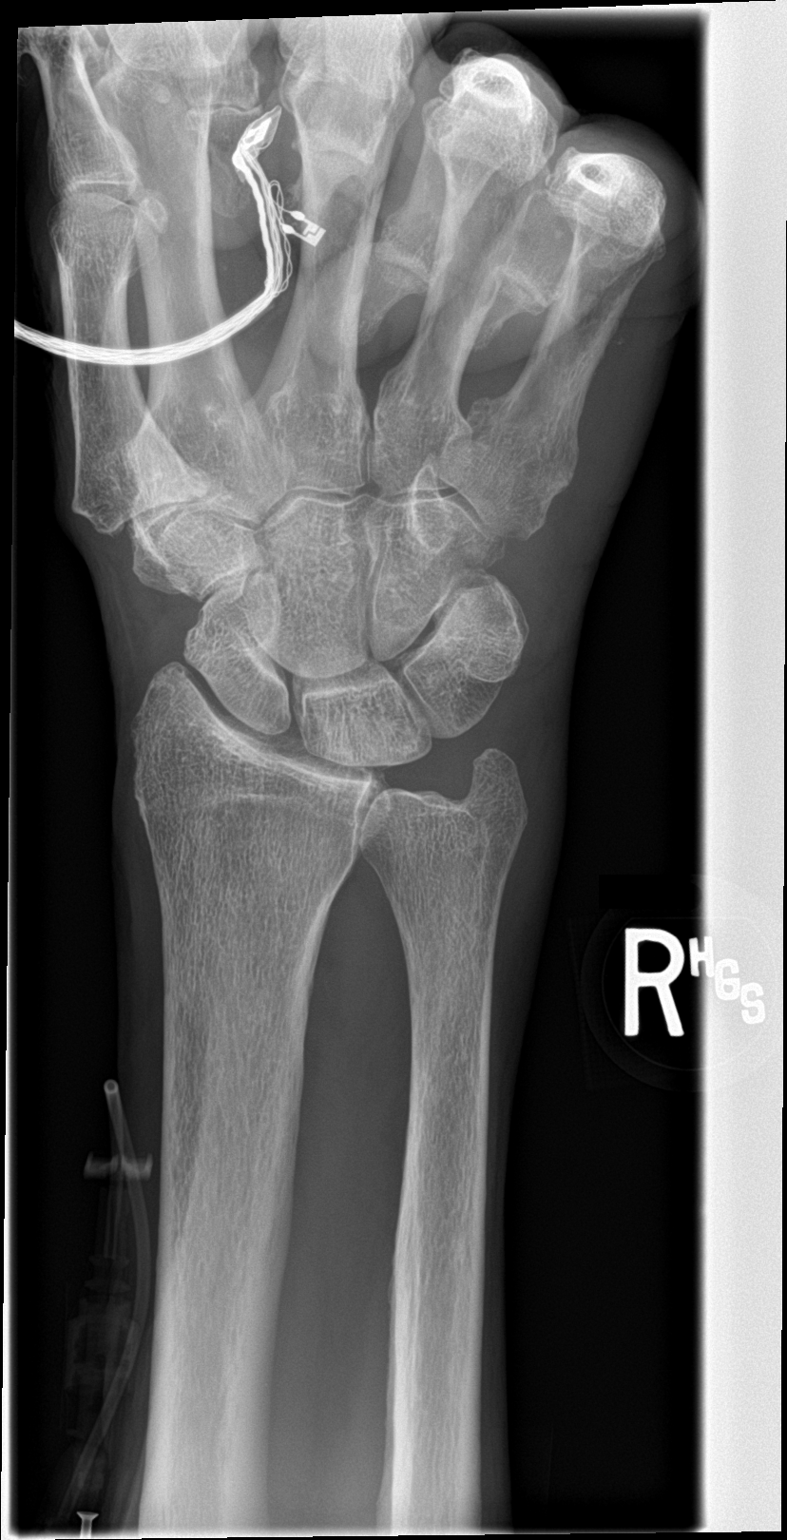

[wrist obl]
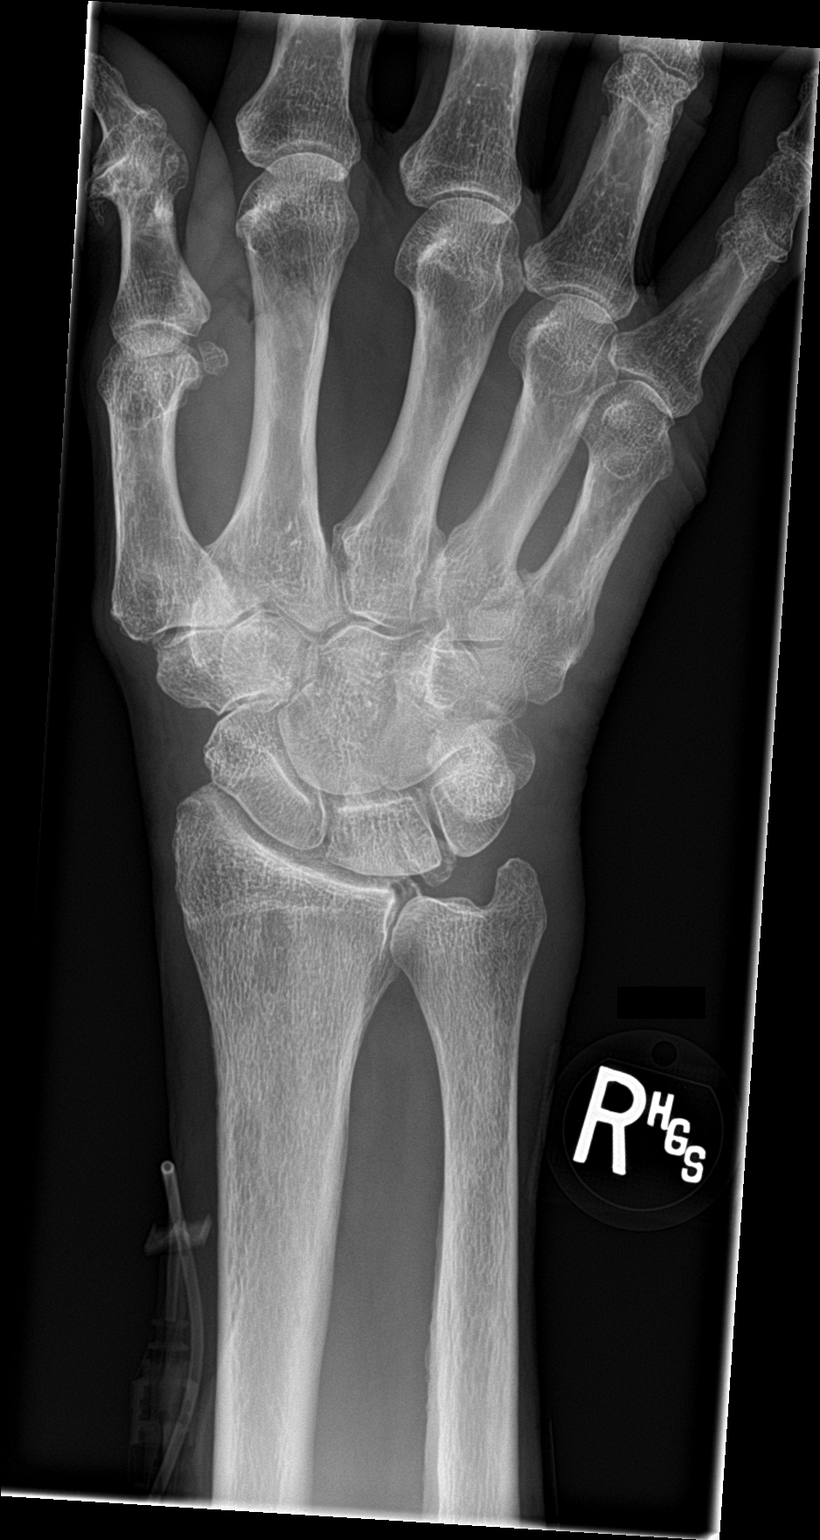

[wrist lat]
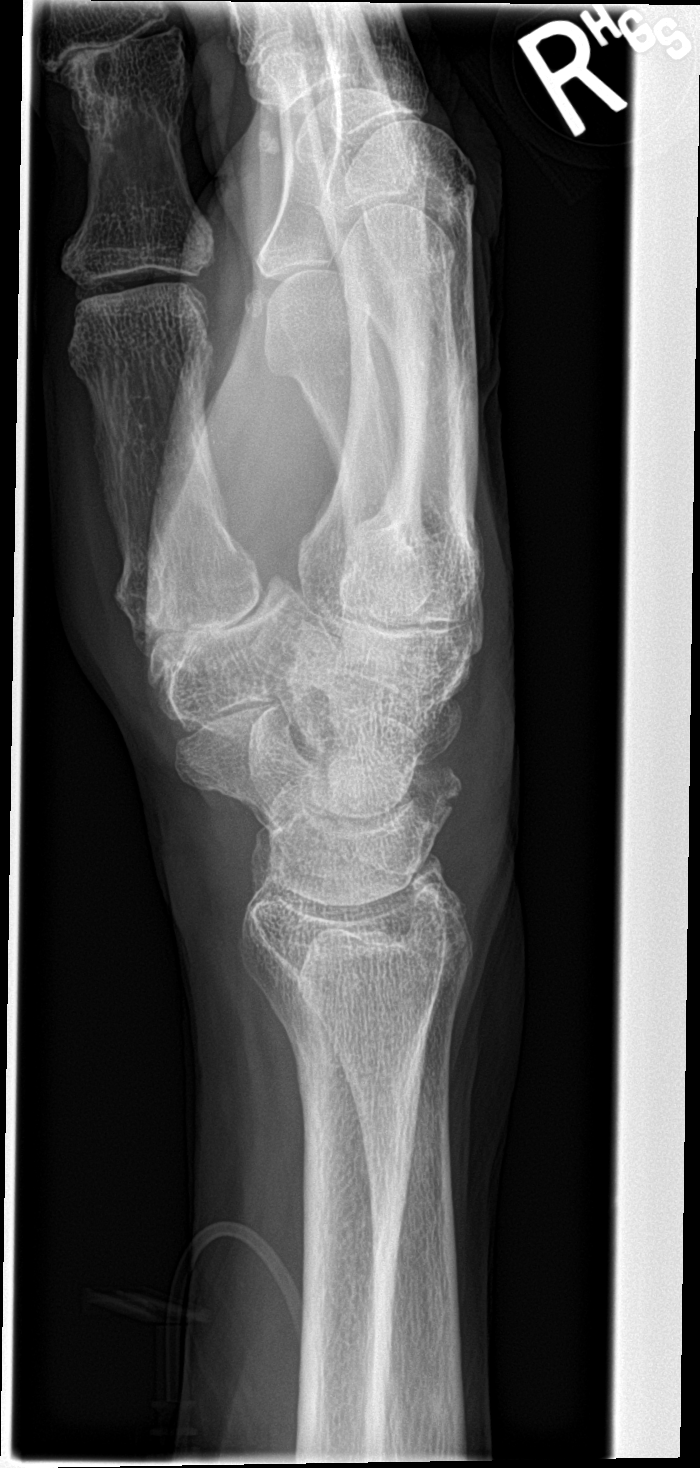

[navicular]
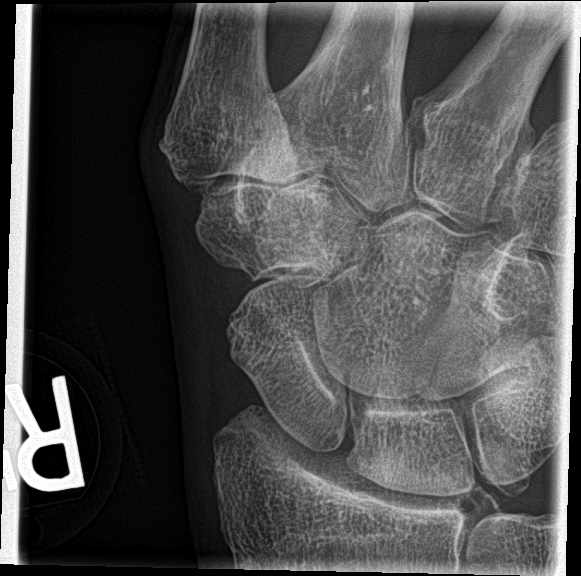

[wrist pa (2 of 2)]
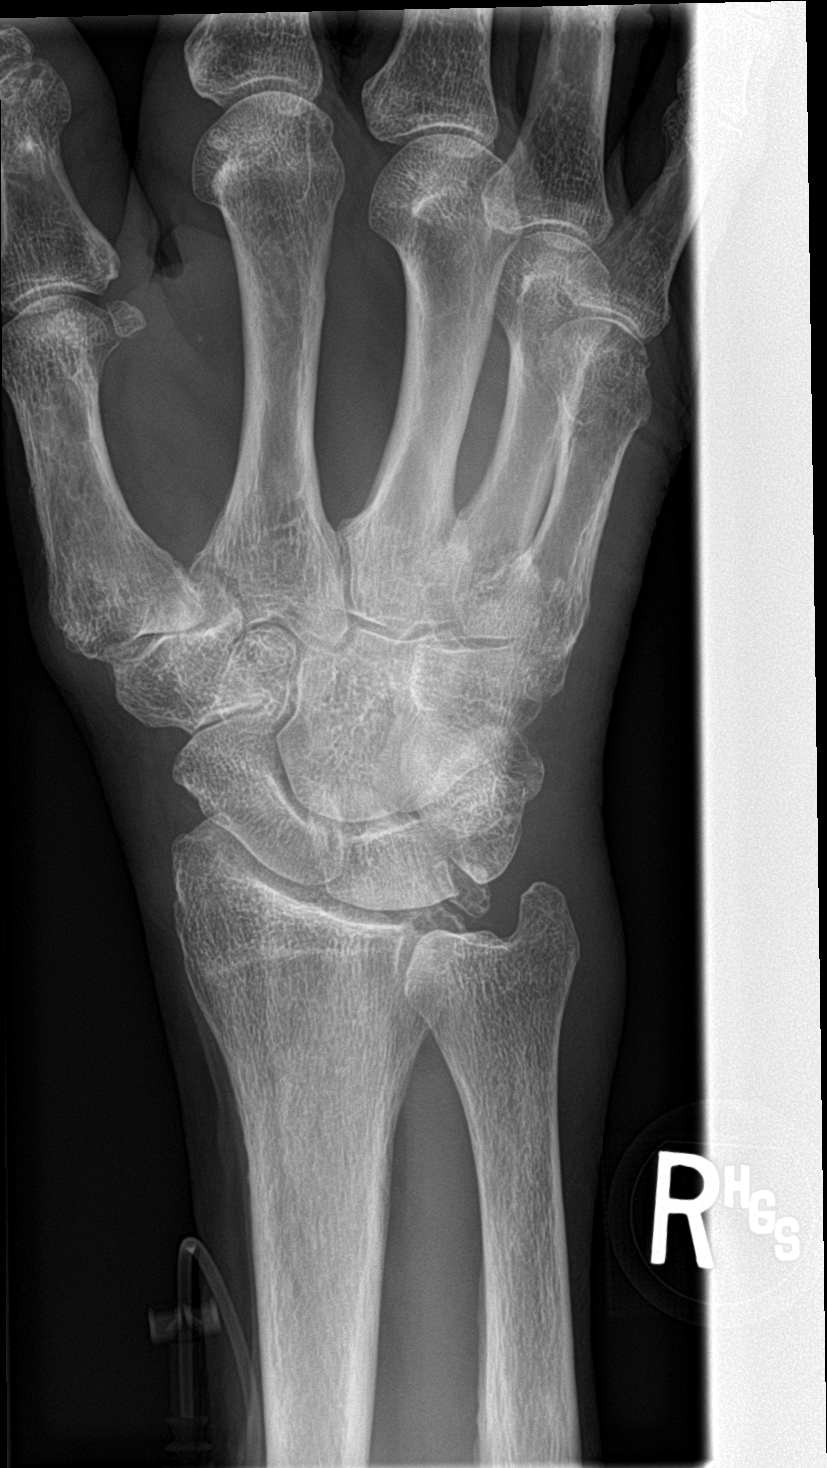

[5 of 5 positions shown; findings below may reference images not displayed]

FINDINGS: There is no evidence of fracture or dislocation. There is mild
osteoarthritis of the first CMC joint and first MCP joint. There is
moderate osteoarthritis of the first IP joint. There is mild
osteoarthritis of the distal radioulnar joint. Soft tissues are
unremarkable.
IMPRESSION: No acute osseous injury of the right wrist.

## 2016-12-02 ENCOUNTER — Encounter: Payer: Self-pay | Admitting: *Deleted

## 2016-12-02 ENCOUNTER — Observation Stay
Admission: EM | Admit: 2016-12-02 | Discharge: 2016-12-04 | Disposition: A | Discharge status: Home / Self Care | Payer: PPO | Attending: Specialist | Admitting: Specialist

## 2016-12-02 ENCOUNTER — Emergency Department: Payer: PPO

## 2016-12-02 DIAGNOSIS — I479 Paroxysmal tachycardia, unspecified: Secondary | ICD-10-CM | POA: Diagnosis not present

## 2016-12-02 DIAGNOSIS — R0602 Shortness of breath: Secondary | ICD-10-CM | POA: Diagnosis not present

## 2016-12-02 DIAGNOSIS — Z951 Presence of aortocoronary bypass graft: Secondary | ICD-10-CM | POA: Diagnosis not present

## 2016-12-02 DIAGNOSIS — Z8673 Personal history of transient ischemic attack (TIA), and cerebral infarction without residual deficits: Secondary | ICD-10-CM | POA: Diagnosis not present

## 2016-12-02 DIAGNOSIS — I110 Hypertensive heart disease with heart failure: Secondary | ICD-10-CM | POA: Diagnosis not present

## 2016-12-02 DIAGNOSIS — E43 Unspecified severe protein-calorie malnutrition: Secondary | ICD-10-CM | POA: Insufficient documentation

## 2016-12-02 DIAGNOSIS — J9621 Acute and chronic respiratory failure with hypoxia: Secondary | ICD-10-CM | POA: Insufficient documentation

## 2016-12-02 DIAGNOSIS — Z7902 Long term (current) use of antithrombotics/antiplatelets: Secondary | ICD-10-CM | POA: Diagnosis not present

## 2016-12-02 DIAGNOSIS — J90 Pleural effusion, not elsewhere classified: Principal | ICD-10-CM | POA: Insufficient documentation

## 2016-12-02 DIAGNOSIS — Z9889 Other specified postprocedural states: Secondary | ICD-10-CM | POA: Diagnosis not present

## 2016-12-02 DIAGNOSIS — E785 Hyperlipidemia, unspecified: Secondary | ICD-10-CM | POA: Insufficient documentation

## 2016-12-02 DIAGNOSIS — I352 Nonrheumatic aortic (valve) stenosis with insufficiency: Secondary | ICD-10-CM | POA: Diagnosis not present

## 2016-12-02 DIAGNOSIS — Z681 Body mass index (BMI) 19 or less, adult: Secondary | ICD-10-CM | POA: Insufficient documentation

## 2016-12-02 DIAGNOSIS — I471 Supraventricular tachycardia: Secondary | ICD-10-CM | POA: Insufficient documentation

## 2016-12-02 DIAGNOSIS — I2510 Atherosclerotic heart disease of native coronary artery without angina pectoris: Secondary | ICD-10-CM | POA: Insufficient documentation

## 2016-12-02 DIAGNOSIS — Z79899 Other long term (current) drug therapy: Secondary | ICD-10-CM | POA: Insufficient documentation

## 2016-12-02 DIAGNOSIS — G629 Polyneuropathy, unspecified: Secondary | ICD-10-CM | POA: Insufficient documentation

## 2016-12-02 DIAGNOSIS — Z8546 Personal history of malignant neoplasm of prostate: Secondary | ICD-10-CM | POA: Insufficient documentation

## 2016-12-02 DIAGNOSIS — Z87891 Personal history of nicotine dependence: Secondary | ICD-10-CM | POA: Diagnosis not present

## 2016-12-02 DIAGNOSIS — R002 Palpitations: Secondary | ICD-10-CM | POA: Diagnosis not present

## 2016-12-02 DIAGNOSIS — Z96653 Presence of artificial knee joint, bilateral: Secondary | ICD-10-CM | POA: Insufficient documentation

## 2016-12-02 DIAGNOSIS — Z953 Presence of xenogenic heart valve: Secondary | ICD-10-CM | POA: Diagnosis not present

## 2016-12-02 DIAGNOSIS — D638 Anemia in other chronic diseases classified elsewhere: Secondary | ICD-10-CM | POA: Insufficient documentation

## 2016-12-02 DIAGNOSIS — J9620 Acute and chronic respiratory failure, unspecified whether with hypoxia or hypercapnia: Secondary | ICD-10-CM | POA: Diagnosis not present

## 2016-12-02 DIAGNOSIS — I509 Heart failure, unspecified: Secondary | ICD-10-CM | POA: Diagnosis not present

## 2016-12-02 DIAGNOSIS — I5032 Chronic diastolic (congestive) heart failure: Secondary | ICD-10-CM | POA: Diagnosis not present

## 2016-12-02 HISTORY — DX: Presence of aortocoronary bypass graft: Z95.1

## 2016-12-02 HISTORY — DX: Supraventricular tachycardia, unspecified: I47.10

## 2016-12-02 HISTORY — DX: Supraventricular tachycardia: I47.1

## 2016-12-02 HISTORY — DX: Atherosclerotic heart disease of native coronary artery without angina pectoris: I25.10

## 2016-12-02 LAB — CBC
HCT: 31.6 % — ABNORMAL LOW (ref 40.0–52.0)
HEMOGLOBIN: 10.8 g/dL — AB (ref 13.0–18.0)
MCH: 31.3 pg (ref 26.0–34.0)
MCHC: 34.4 g/dL (ref 32.0–36.0)
MCV: 91.2 fL (ref 80.0–100.0)
Platelets: 256 10*3/uL (ref 150–440)
RBC: 3.46 MIL/uL — AB (ref 4.40–5.90)
RDW: 12.9 % (ref 11.5–14.5)
WBC: 6.9 10*3/uL (ref 3.8–10.6)

## 2016-12-02 LAB — COMPREHENSIVE METABOLIC PANEL
ALK PHOS: 79 U/L (ref 38–126)
ALT: 15 U/L — AB (ref 17–63)
AST: 17 U/L (ref 15–41)
Albumin: 3 g/dL — ABNORMAL LOW (ref 3.5–5.0)
Anion gap: 7 (ref 5–15)
BUN: 11 mg/dL (ref 6–20)
CALCIUM: 8.2 mg/dL — AB (ref 8.9–10.3)
CO2: 29 mmol/L (ref 22–32)
CREATININE: 0.66 mg/dL (ref 0.61–1.24)
Chloride: 103 mmol/L (ref 101–111)
Glucose, Bld: 82 mg/dL (ref 65–99)
Potassium: 4.3 mmol/L (ref 3.5–5.1)
SODIUM: 139 mmol/L (ref 135–145)
Total Bilirubin: 0.9 mg/dL (ref 0.3–1.2)
Total Protein: 6.2 g/dL — ABNORMAL LOW (ref 6.5–8.1)

## 2016-12-02 LAB — TSH: TSH: 3.744 u[IU]/mL (ref 0.350–4.500)

## 2016-12-02 LAB — TROPONIN I
Troponin I: <0.03 ng/mL (ref <0.03)
Troponin I: <0.03 ng/mL (ref <0.03)

## 2016-12-02 MED ORDER — SODIUM CHLORIDE 0.9% FLUSH
3.0000 mL | Freq: Two times a day (BID) | INTRAVENOUS | Status: DC
  Administered 2016-12-03: 3 mL via INTRAVENOUS

## 2016-12-02 MED ORDER — ATORVASTATIN CALCIUM 20 MG PO TABS
40.0000 mg | ORAL_TABLET | Freq: Every day | ORAL | Status: DC
  Administered 2016-12-02 – 2016-12-03 (×2): 40 mg via ORAL
  Filled 2016-12-02 (×2): qty 2

## 2016-12-02 MED ORDER — SODIUM CHLORIDE 0.9% FLUSH
3.0000 mL | Freq: Two times a day (BID) | INTRAVENOUS | Status: DC
  Administered 2016-12-02 – 2016-12-04 (×5): 3 mL via INTRAVENOUS

## 2016-12-02 MED ORDER — SODIUM CHLORIDE 0.9 % IV SOLN
250.0000 mL | INTRAVENOUS | Status: DC | PRN
Start: 2016-12-02 — End: 2016-12-04

## 2016-12-02 MED ORDER — GABAPENTIN 600 MG PO TABS
300.0000 mg | ORAL_TABLET | Freq: Two times a day (BID) | ORAL | Status: DC
Start: 2016-12-02 — End: 2016-12-04
  Administered 2016-12-02 – 2016-12-04 (×5): 300 mg via ORAL
  Filled 2016-12-02 (×5): qty 1

## 2016-12-02 MED ORDER — ALBUTEROL SULFATE (2.5 MG/3ML) 0.083% IN NEBU: 2.5000 mg | INHALATION_SOLUTION | RESPIRATORY_TRACT | Status: DC | PRN

## 2016-12-02 MED ORDER — ONDANSETRON HCL 4 MG/2ML IJ SOLN: 4.0000 mg | Freq: Four times a day (QID) | INTRAMUSCULAR | Status: DC | PRN

## 2016-12-02 MED ORDER — SODIUM CHLORIDE 0.9% FLUSH
3.0000 mL | INTRAVENOUS | Status: DC | PRN
  Administered 2016-12-04: 3 mL via INTRAVENOUS
  Filled 2016-12-02: qty 3

## 2016-12-02 MED ORDER — ACETAMINOPHEN 325 MG PO TABS: 650.0000 mg | ORAL_TABLET | Freq: Four times a day (QID) | ORAL | Status: DC | PRN

## 2016-12-02 MED ORDER — METOPROLOL TARTRATE 25 MG PO TABS
25.0000 mg | ORAL_TABLET | Freq: Two times a day (BID) | ORAL | Status: DC
  Administered 2016-12-02 – 2016-12-04 (×5): 25 mg via ORAL
  Filled 2016-12-02 (×5): qty 1

## 2016-12-02 MED ORDER — ONDANSETRON HCL 4 MG PO TABS: 4.0000 mg | ORAL_TABLET | Freq: Four times a day (QID) | ORAL | Status: DC | PRN

## 2016-12-02 MED ORDER — TRAMADOL HCL 50 MG PO TABS: 50.0000 mg | ORAL_TABLET | Freq: Four times a day (QID) | ORAL | Status: DC | PRN

## 2016-12-02 MED ORDER — POLYETHYLENE GLYCOL 3350 17 G PO PACK: 17.0000 g | PACK | Freq: Every day | ORAL | Status: DC | PRN

## 2016-12-02 MED ORDER — CLOPIDOGREL BISULFATE 75 MG PO TABS
75.0000 mg | ORAL_TABLET | Freq: Every day | ORAL | Status: DC
  Administered 2016-12-02 – 2016-12-04 (×2): 75 mg via ORAL
  Filled 2016-12-02 (×3): qty 1

## 2016-12-02 MED ORDER — ACETAMINOPHEN 650 MG RE SUPP: 650.0000 mg | Freq: Four times a day (QID) | RECTAL | Status: DC | PRN

## 2016-12-02 NOTE — Consult Note (Signed)
[facility] Cardiology Consultation Note  Patient ID: Juan Mayo, MRN: [id number], DOB/AGE: 1921/12/11 83 y.o. Admit date: 12/02/2016   Date of Consult: 12/02/2016 Primary Physician: Leonel Ramsay, MD Primary Cardiologist: Nehemiah Massed  Chief Complaint:  Chief Complaint  Patient presents with  . Shortness of Breath   Reason for Consult: palpitations with pleural effusion and possible heart failure  HPI: 83 y.o. male with known coronary artery disease status post coronary bypass graft and aortic valve stenosis status post aortic valve replacement many years ago who has done fairly well over the last many years on appropriate medication management including metoprolol for hypertension control and lipid management with atorvastatin. The patient has had some significant new issues and symptoms concerning for her shortness of breath weakness and fatigue and heart failure. He also has had palpitations and irregular heartbeat. Echocardiogram last year showed normal LV systolic function with mild aortic valve stenosis and mild aortic insufficiency moderate to mitral and tricuspid regurgitation. Additionally Holter monitor has shown the frequent preventricular contractions and short runs of SVT. Currently his symptoms suggest that his SVT is longer than a few beats. The patient also has been coughing and congestion with shortness of breath and possibly consistent with this recent episode of pleural effusion. All of this new symptoms suggestive of diastolic and/or systolic heart failure for which needs further investigation  Past Medical History:  Diagnosis Date  . CAD (coronary artery disease)   . Hx of CABG   . Hypercholesteremia   . Neuropathy   . Prostate cancer ([place])   . Stroke ([place])   . SVT (supraventricular tachycardia) [facility])       Surgical History:  Past Surgical History:  Procedure Laterality Date  . AORTIC VALVE REPLACEMENT    . BACK SURGERY    . CORONARY ARTERY BYPASS GRAFT     . FRACTURE SURGERY Right   . INTRAMEDULLARY (IM) NAIL INTERTROCHANTERIC Right 03/18/2015   Procedure: INTRAMEDULLARY (IM) NAIL INTERTROCHANTRIC;  Surgeon: [name], MD;  Location: [facility] ORS;  Service: Orthopedics;  Laterality: Right;  . PROSTATECTOMY    . REPLACEMENT TOTAL KNEE BILATERAL       Home Meds: Prior to Admission medications   Medication Sig Start Date End Date Taking? Authorizing Provider  atorvastatin (LIPITOR) 40 MG tablet Take 1 tablet (40 mg total) by mouth daily at 6 PM. Patient taking differently: Take 40 mg by mouth at bedtime.  02/25/16  Yes Epifanio Lesches, MD  clopidogrel (PLAVIX) 75 MG tablet Take 1 tablet (75 mg total) by mouth daily. 02/26/16  Yes Epifanio Lesches, MD  gabapentin (NEURONTIN) 600 MG tablet Take 300 mg by mouth 2 (two) times daily.    Yes [provider]  metoprolol tartrate (LOPRESSOR) 25 MG tablet Take 25 mg by mouth 2 (two) times daily.   Yes [provider]  Multiple Vitamins-Minerals (CENTRUM SILVER) tablet Take 1 tablet by mouth daily.   Yes [provider]    Inpatient Medications:  . atorvastatin  40 mg Oral QHS  . clopidogrel  75 mg Oral Daily  . gabapentin  300 mg Oral BID  . metoprolol tartrate  25 mg Oral BID  . sodium chloride flush  3 mL Intravenous Q12H  . sodium chloride flush  3 mL Intravenous Q12H   . sodium chloride      Allergies: No Known Allergies  Social History   Social History  . Marital status: Widowed    Spouse name: N/A  . Number of children:  N/A  . Years of education: N/A   Occupational History  . Not on file.   Social History Main Topics  . Smoking status: Former Smoker    Types: Cigarettes  . Smokeless tobacco: Never Used  . Alcohol use No  . Drug use: No  . Sexual activity: Not on file   Other Topics Concern  . Not on file   Social History Narrative  . No narrative on file     Family History  Problem Relation Age of Onset  . Hypertension Other   .  Parkinson's disease Mother   . Alzheimer's disease Father   . Cancer Sister   . Heart disease Brother   . Heart disease Brother   . Heart disease Brother      Review of Systems Positive for Palpitations shortness of breath cough congestion Negative for: General:  chills, fever, night sweats or weight changes.  Cardiovascular: PND orthopnea syncope dizziness  Dermatological skin lesions rashes Respiratory: Positive for Cough congestion Urologic: Frequent urination urination at night and hematuria Abdominal: negative for nausea, vomiting, diarrhea, bright red blood per rectum, melena, or hematemesis Neurologic: negative for visual changes, and/or hearing changes  All other systems reviewed and are otherwise negative except as noted above.  Labs:  Recent Labs  12/02/16 1004 12/02/16 1544  TROPONINI <0.03 <0.03   Lab Results  Component Value Date   WBC 6.9 12/02/2016   HGB 10.8 (L) 12/02/2016   HCT 31.6 (L) 12/02/2016   MCV 91.2 12/02/2016   PLT 256 12/02/2016    Recent Labs Lab 12/02/16 1004  NA 139  K 4.3  CL 103  CO2 29  BUN 11  CREATININE 0.66  CALCIUM 8.2*  PROT 6.2*  BILITOT 0.9  ALKPHOS 79  ALT 15*  AST 17  GLUCOSE 82   Lab Results  Component Value Date   CHOL 155 02/24/2016   HDL 46 02/24/2016   LDLCALC 100 (H) 02/24/2016   TRIG 46 02/24/2016   No results found for: DDIMER  Radiology/Studies:  Dg Chest Portable 1 View  Result Date: 12/02/2016 CLINICAL DATA:  Shortness of breath. EXAM: PORTABLE CHEST 1 VIEW COMPARISON:  03/18/2016 FINDINGS: Normal heart size. Previous median sternotomy and CABG procedure. There is a moderate right pleural effusion with overlying atelectasis. No interstitial edema. IMPRESSION: 1. Moderate right pleural effusion with overlying atelectasis. Electronically Signed   By: Kerby Moors M.D.   On: 12/02/2016 09:35    EKG: Normal sinus rhythm with left ventricular hypertrophy and pre-atrial contractions  Weights: Filed  Weights   12/02/16 0910 12/02/16 1430  Weight: 50.8 kg (112 lb) 53 kg (116 lb 14.4 oz)     Physical Exam: Blood pressure (!) 170/86, pulse 94, temperature 98.2 F (36.8 C), resp. rate 18, height 5\' 7"  (1.702 m), weight 53 kg (116 lb 14.4 oz), SpO2 100 %. Body mass index is 18.31 kg/m. General: Well developed, well nourished, in no acute distress. Head eyes ears nose throat: Normocephalic, atraumatic, sclera non-icteric, no xanthomas, nares are without discharge. No apparent thyromegaly and/or mass  Lungs: Normal respiratory effort. Few wheezes, no rales, no rhonchi. Left basilar decreased breath sounds Heart: Irregular with normal S1 S2. 2-3 out of 6 right upper sternal border murmur radiating through the apex gallop, no rub, PMI is normal size and placement, carotid upstroke normal without bruit, jugular venous pressure is normal Abdomen: Soft, non-tender, non-distended with normoactive bowel sounds. No hepatomegaly. No rebound/guarding. No obvious abdominal masses. Abdominal aorta is normal  size without bruit Extremities: Trace edema. no cyanosis, no clubbing, no ulcers  Peripheral : 2+ bilateral upper extremity pulses, 2+ bilateral femoral pulses, 2+ bilateral dorsal pedal pulse Neuro: Alert and oriented. No facial asymmetry. No focal deficit. Moves all extremities spontaneously. Musculoskeletal: Normal muscle tone without kyphosis Psych:  Responds to questions appropriately with a normal affect.    Assessment: 83 year old male with known coronary artery disease status post coronary bypass graft aortic valve replacement essential hypertension mixed hyperlipidemia and palpitations with shortness of breath to having new onset pleural effusion and possible congestive heart failure without current evidence of myocardial infarction  Plan: 1. Continue supportive care of palpitations 2. Telemetry for extent of supraventricular tachycardia and further treatment if necessary 3. Echocardiogram  for LV systolic dysfunction valvular heart disease causing heart failure and pleural effusion 4. Possible thoracentesis to improve shortness of breath and pleural effusion with some diagnostics to assess for transudate versus exudate 5. Continue metoprolol for heart rate control and blood pressure control 6. Increase metoprolol as necessary to supraventricular tachycardias occurring 7. Further diagnostic testing and treatment options after above  Signed, Corey Skains M.D. [facility] 12/02/2016, 4:40 PM

## 2016-12-02 NOTE — ED Notes (Signed)
Pt given crackers, peanut butter, and sprite

## 2016-12-02 NOTE — H&P (Signed)
[facility] NAME: Juan Mayo    MR#:  [id number]  DATE OF BIRTH:  22-Dec-1921  DATE OF ADMISSION:  12/02/2016  PRIMARY CARE PHYSICIAN: Leonel Ramsay, MD   REQUESTING/REFERRING PHYSICIAN: Dr. Corky Downs  CHIEF COMPLAINT:   Chief Complaint  Patient presents with  . Shortness of Breath    HISTORY OF PRESENT ILLNESS:  Juan Mayo  is a 83 y.o. male with a known history of CAD status post CABG in 2006, bioprosthetic aortic valve replacement, CVA, SVT, bradycardia presents to the emergency room complaining of acute onset of nausea, palpitations and not feeling well. This is resolved at this time. The symptoms are similar to his prior SVT episodes. But were more severe and lasted longer. No arrhythmias on EKG or telemetry other than PVCs. Chest x-ray shows new right sided moderate pleural effusion. He does not have any shortness of breath, fever, orthopnea.  Case was discussed from emergency room with his cardiologist. Patient is being admitted under observation.  PAST MEDICAL HISTORY:   Past Medical History:  Diagnosis Date  . CAD (coronary artery disease)   . Hx of CABG   . Hypercholesteremia   . Neuropathy   . Prostate cancer ([place])   . Stroke ([place])   . SVT (supraventricular tachycardia) ([place])     PAST SURGICAL HISTORY:   Past Surgical History:  Procedure Laterality Date  . AORTIC VALVE REPLACEMENT    . BACK SURGERY    . CORONARY ARTERY BYPASS GRAFT    . FRACTURE SURGERY Right   . INTRAMEDULLARY (IM) NAIL INTERTROCHANTERIC Right 03/18/2015   Procedure: INTRAMEDULLARY (IM) NAIL INTERTROCHANTRIC;  Surgeon: [name], MD;  Location: [facility] ORS;  Service: Orthopedics;  Laterality: Right;  . PROSTATECTOMY    . REPLACEMENT TOTAL KNEE BILATERAL      SOCIAL HISTORY:   Social History  Substance Use Topics  . Smoking status: Former Smoker    Types: Cigarettes  . Smokeless tobacco: Never Used  . Alcohol use No    FAMILY  HISTORY:   Family History  Problem Relation Age of Onset  . Hypertension Other   . Parkinson's disease Mother   . Alzheimer's disease Father   . Cancer Sister   . Heart disease Brother   . Heart disease Brother   . Heart disease Brother     DRUG ALLERGIES:  No Known Allergies  REVIEW OF SYSTEMS:   Review of Systems  Constitutional: Positive for malaise/fatigue. Negative for chills and fever.  HENT: Negative for sore throat.   Eyes: Negative for blurred vision, double vision and pain.  Respiratory: Negative for cough, hemoptysis, shortness of breath and wheezing.   Cardiovascular: Positive for palpitations. Negative for chest pain, orthopnea and leg swelling.  Gastrointestinal: Positive for nausea. Negative for abdominal pain, constipation, diarrhea, heartburn and vomiting.  Genitourinary: Negative for dysuria and hematuria.  Musculoskeletal: Negative for back pain and joint pain.  Skin: Negative for rash.  Neurological: Positive for weakness. Negative for sensory change, speech change, focal weakness and headaches.  Endo/Heme/Allergies: Does not bruise/bleed easily.  Psychiatric/Behavioral: Negative for depression. The patient is not nervous/anxious.     MEDICATIONS AT HOME:   Prior to Admission medications   Medication Sig Start Date End Date Taking? Authorizing Provider  atorvastatin (LIPITOR) 40 MG tablet Take 1 tablet (40 mg total) by mouth daily at 6 PM. Patient taking differently: Take 40 mg by mouth at bedtime.  02/25/16  Yes Epifanio Lesches, MD  clopidogrel (PLAVIX) 75 MG tablet Take 1 tablet (75 mg total) by mouth daily. 02/26/16  Yes Epifanio Lesches, MD  gabapentin (NEURONTIN) 600 MG tablet Take 300 mg by mouth 2 (two) times daily.    Yes [provider]  metoprolol tartrate (LOPRESSOR) 25 MG tablet Take 25 mg by mouth 2 (two) times daily.   Yes [provider]  Multiple Vitamins-Minerals (CENTRUM SILVER) tablet Take 1 tablet by mouth  daily.   Yes [provider]     VITAL SIGNS:  Blood pressure 119/88, pulse 83, temperature 97.7 F (36.5 C), temperature source Oral, resp. rate (!) 22, height 5\' 7"  (1.702 m), weight 50.8 kg (112 lb), SpO2 100 %.  PHYSICAL EXAMINATION:  Physical Exam  GENERAL:  83 y.o.-year-old patient lying in the bed with no acute distress.  EYES: Pupils equal, round, reactive to light and accommodation. No scleral icterus. Extraocular muscles intact.  HEENT: Head atraumatic, normocephalic. Oropharynx and nasopharynx clear. No oropharyngeal erythema, moist oral mucosa  NECK:  Supple, no jugular venous distention. No thyroid enlargement, no tenderness.  LUNGS: Normal breath sounds bilaterally, no wheezing, rales, rhonchi. No use of accessory muscles of respiration. Decreased air entry at the right base CARDIOVASCULAR: S1, S2 normal. No murmurs, rubs, or gallops.  ABDOMEN: Soft, nontender, nondistended. Bowel sounds present. No organomegaly or mass.  EXTREMITIES: No pedal edema, cyanosis, or clubbing. + 2 pedal & radial pulses b/l.   NEUROLOGIC: Cranial nerves II through XII are intact. No focal Motor or sensory deficits appreciated b/l PSYCHIATRIC: The patient is alert and oriented x 3. Good affect.  SKIN: No obvious rash, lesion, or ulcer.   LABORATORY PANEL:   CBC  Recent Labs Lab 12/02/16 1004  WBC 6.9  HGB 10.8*  HCT 31.6*  PLT 256   ------------------------------------------------------------------------------------------------------------------  Chemistries   Recent Labs Lab 12/02/16 1004  NA 139  K 4.3  CL 103  CO2 29  GLUCOSE 82  BUN 11  CREATININE 0.66  CALCIUM 8.2*  AST 17  ALT 15*  ALKPHOS 79  BILITOT 0.9   ------------------------------------------------------------------------------------------------------------------  Cardiac Enzymes  Recent Labs Lab 12/02/16 1004  TROPONINI <0.03    ------------------------------------------------------------------------------------------------------------------  RADIOLOGY:  Dg Chest Portable 1 View  Result Date: 12/02/2016 CLINICAL DATA:  Shortness of breath. EXAM: PORTABLE CHEST 1 VIEW COMPARISON:  03/18/2016 FINDINGS: Normal heart size. Previous median sternotomy and CABG procedure. There is a moderate right pleural effusion with overlying atelectasis. No interstitial edema. IMPRESSION: 1. Moderate right pleural effusion with overlying atelectasis. Electronically Signed   By: Kerby Moors M.D.   On: 12/02/2016 09:35     IMPRESSION AND PLAN:   * Moderate right pleural effusion. New No fever or cough. No shortness of breath. No mass. It is unclear if the SVT is a result of the pleural effusion or this is just chronic palpitations and SVT the patient has. We'll consult pulmonary for further input. Will need outpatient follow-up.  * Palpitations which are most likely SVT. Patient's symptoms are similar to his episodes in the past but more severe. Presently he is back to baseline. Has had episodes of bradycardia in the past. Would not start any new beta blockers or calcium channel blockers at this time. Consult cardiology Dr. Nehemiah Massed.  * History of CAD status post CABG. On Plavix and statin.  DVT prophylaxis. SCDs ordered. No Lovenox given in case patient needs thoracentesis.  All the records are reviewed and case discussed with ED provider. Management plans discussed with the patient,  family and they are in agreement.  CODE STATUS: FULL CODE  TOTAL TIME TAKING CARE OF THIS PATIENT: 40 minutes.   Hillary Bow R M.D on 12/02/2016 at 1:10 PM  Between 7am to 6pm - Pager - [phone]  After 6pm go to [email or web address] - password [email or web address] [facility]  [facility]  Office  [phone]  CC: Primary care physician; Leonel Ramsay, MD  Note: This dictation was prepared with Dragon dictation along with smaller phrase  technology. Any transcriptional errors that result from this process are unintentional.

## 2016-12-02 NOTE — ED Provider Notes (Signed)
[facility] Emergency Department Provider Note   ____________________________________________    I have reviewed the triage vital signs and the nursing notes.   HISTORY  Chief Complaint Shortness of Breath     HPI Juan Mayo is a 84 y.o. male who presents with palpitations shortness of breath. Patient reports that this morning he felt as if his heart skipped a beat and then he developed palpitations. He reports this has improved since he has been here many continues to feel that his heart is irregular. He denies a history of this. He does have a history of CABG and aortic valve replacement. He denies chest pain. Reports his breathing became labored when his heart was beating rapidly, he also reports he felt nauseated. No fevers or chills or cough. He has never had this before. He sees Dr. Nehemiah Massed of cardiology   Past Medical History:  Diagnosis Date  . Hypercholesteremia   . Neuropathy   . Prostate cancer ([place])   . Stroke [facility])     Patient Active Problem List   Diagnosis Date Noted  . CVA (cerebral vascular accident) ([place]) 02/23/2016  . Hip fracture ([place]) 03/18/2015    Past Surgical History:  Procedure Laterality Date  . AORTIC VALVE REPLACEMENT    . BACK SURGERY    . CORONARY ARTERY BYPASS GRAFT    . FRACTURE SURGERY Right   . INTRAMEDULLARY (IM) NAIL INTERTROCHANTERIC Right 03/18/2015   Procedure: INTRAMEDULLARY (IM) NAIL INTERTROCHANTRIC;  Surgeon: [name], MD;  Location: [facility] ORS;  Service: Orthopedics;  Laterality: Right;  . PROSTATECTOMY    . REPLACEMENT TOTAL KNEE BILATERAL      Prior to Admission medications   Medication Sig Start Date End Date Taking? Authorizing Provider  atorvastatin (LIPITOR) 40 MG tablet Take 1 tablet (40 mg total) by mouth daily at 6 PM. Patient taking differently: Take 40 mg by mouth at bedtime.  02/25/16  Yes Epifanio Lesches, MD  clopidogrel (PLAVIX) 75 MG tablet Take 1 tablet (75 mg total)  by mouth daily. 02/26/16  Yes Epifanio Lesches, MD  gabapentin (NEURONTIN) 600 MG tablet Take 300 mg by mouth 2 (two) times daily.    Yes [provider]  metoprolol tartrate (LOPRESSOR) 25 MG tablet Take 25 mg by mouth 2 (two) times daily.   Yes [provider]  Multiple Vitamins-Minerals (CENTRUM SILVER) tablet Take 1 tablet by mouth daily.   Yes [provider]     Allergies Patient has no known allergies.  Family History  Problem Relation Age of Onset  . Hypertension Other   . Parkinson's disease Mother   . Alzheimer's disease Father   . Cancer Sister   . Heart disease Brother   . Heart disease Brother   . Heart disease Brother     Social History Social History  Substance Use Topics  . Smoking status: Former Smoker    Types: Cigarettes  . Smokeless tobacco: Never Used  . Alcohol use No    Review of Systems  Constitutional: No fever/chills Eyes: No visual changes.  ENT: No sore throat. Cardiovascular: As above Respiratory: Denies shortness of breath. Gastrointestinal: No abdominal pain.  Nausea as above, now resolved Genitourinary: Negative for dysuria. Musculoskeletal: Negative for back pain. Skin: Negative for rash. Neurological: Negative for headaches    ____________________________________________   PHYSICAL EXAM:  VITAL SIGNS: ED Triage Vitals [12/02/16 0910]  Enc Vitals Group     BP (!) 154/70     Pulse Rate 95  Resp 20     Temp 97.7 F (36.5 C)     Temp Source Oral     SpO2 98 %     Weight 50.8 kg (112 lb)     Height 1.702 m (5\' 7" )     Head Circumference      Peak Flow      Pain Score      Pain Loc      Pain Edu?      Excl. in [place]?     Constitutional: Alert and oriented. No acute distress.  Eyes: Conjunctivae are normal.  Head: Atraumatic. Nose: No congestion/rhinnorhea. Mouth/Throat: Mucous membranes are moist.   Neck:  Painless ROM Cardiovascular: Normal rate,Irregular rhythm. Grossly normal heart  sounds.  Good peripheral circulation. Respiratory: Normal respiratory effort.  No retractions. Lungs CTAB. Gastrointestinal: Soft and nontender. No distention.  No CVA tenderness. Genitourinary: deferred Musculoskeletal: No lower extremity tenderness nor edema.  Warm and well perfused Neurologic:  Normal speech and language. No gross focal neurologic deficits are appreciated.  Skin:  Skin is warm, dry and intact. No rash noted. Psychiatric: Mood and affect are normal. Speech and behavior are normal.  ____________________________________________   LABS (all labs ordered are listed, but only abnormal results are displayed)  Labs Reviewed  CBC - Abnormal; Notable for the following:       Result Value   RBC 3.46 (*)    Hemoglobin 10.8 (*)    HCT 31.6 (*)    All other components within normal limits  COMPREHENSIVE METABOLIC PANEL - Abnormal; Notable for the following:    Calcium 8.2 (*)    Total Protein 6.2 (*)    Albumin 3.0 (*)    ALT 15 (*)    All other components within normal limits  TROPONIN I   ____________________________________________  EKG  ED ECG REPORT I, Lavonia Drafts, the attending physician, personally viewed and interpreted this ECG.  Date: 12/02/2016 EKG Time: 9:03 AM Rate: 100 Rhythm:  sinus rhythm with PACs QRS Axis: normal Intervals: normal ST/T Wave abnormalities: normal   ____________________________________________  RADIOLOGY  Right-sided pleural effusion ____________________________________________   PROCEDURES  Procedure(s) performed: No    Critical Care performed: No ____________________________________________   INITIAL IMPRESSION / ASSESSMENT AND PLAN / ED COURSE  Pertinent labs & imaging results that were available during my care of the patient were reviewed by me and considered in my medical decision making (see chart for details).  Patient presents after episode of palpitations, now resolved. Unclear if this was  paroxysmal atrial fibrillation or other arrhythmia, he does have PACs but does not appear to be in atrial fibrillation at this time. No chest pain. Lab work is reassuring. Vitals normal. We'll discuss with Dr. Nehemiah Massed  Dr. Nehemiah Massed recommends admission for further workup given new pleural effusion.     ____________________________________________   FINAL CLINICAL IMPRESSION(S) / ED DIAGNOSES  Final diagnoses:  Pleural effusion  SOB (shortness of breath)  Paroxysmal SVT (supraventricular tachycardia) (HCC)      NEW MEDICATIONS STARTED DURING THIS VISIT:  New Prescriptions   No medications on file     Note:  This document was prepared using Dragon voice recognition software and may include unintentional dictation errors.    Lavonia Drafts, MD 12/02/16 [phone]

## 2016-12-02 NOTE — ED Triage Notes (Signed)
Pt complains of shortness of breath  Episodes of his heart racing, pt denies chest pain or any other symptoms

## 2016-12-02 NOTE — Plan of Care (Signed)
Problem: Safety: Goal: Ability to remain free from injury will improve Outcome: Progressing  Patient's VSS throughout the shift. Patient complaints of leg cramps but refused to take any pain medication for now and will wait for his gabapentin. Patient rested well. No other concern at the moment. RN will continue to monitor.

## 2016-12-03 ENCOUNTER — Observation Stay: Payer: PPO

## 2016-12-03 DIAGNOSIS — I479 Paroxysmal tachycardia, unspecified: Secondary | ICD-10-CM | POA: Diagnosis not present

## 2016-12-03 DIAGNOSIS — D649 Anemia, unspecified: Secondary | ICD-10-CM | POA: Diagnosis not present

## 2016-12-03 DIAGNOSIS — I5032 Chronic diastolic (congestive) heart failure: Secondary | ICD-10-CM | POA: Diagnosis not present

## 2016-12-03 DIAGNOSIS — R002 Palpitations: Secondary | ICD-10-CM | POA: Diagnosis not present

## 2016-12-03 DIAGNOSIS — I2510 Atherosclerotic heart disease of native coronary artery without angina pectoris: Secondary | ICD-10-CM | POA: Diagnosis not present

## 2016-12-03 DIAGNOSIS — J90 Pleural effusion, not elsewhere classified: Secondary | ICD-10-CM | POA: Diagnosis not present

## 2016-12-03 LAB — GLUCOSE, PLEURAL OR PERITONEAL FLUID: Glucose, Fluid: 116 mg/dL

## 2016-12-03 LAB — ALBUMIN, PLEURAL OR PERITONEAL FLUID: Albumin, Fluid: 1.8 g/dL

## 2016-12-03 LAB — BODY FLUID CELL COUNT WITH DIFFERENTIAL
Eos, Fluid: 0 %
LYMPHS FL: 54 %
Monocyte-Macrophage-Serous Fluid: 6 %
NEUTROPHIL FLUID: 39 %
Other Cells, Fluid: 1 %
WBC FLUID: 442 uL

## 2016-12-03 LAB — BASIC METABOLIC PANEL
ANION GAP: 5 (ref 5–15)
BUN: 12 mg/dL (ref 6–20)
CALCIUM: 8.1 mg/dL — AB (ref 8.9–10.3)
CO2: 30 mmol/L (ref 22–32)
Chloride: 104 mmol/L (ref 101–111)
Creatinine, Ser: 0.62 mg/dL (ref 0.61–1.24)
GFR calc non Af Amer: >60 mL/min (ref >60)
Glucose, Bld: 79 mg/dL (ref 65–99)
Potassium: 4.1 mmol/L (ref 3.5–5.1)
Sodium: 139 mmol/L (ref 135–145)

## 2016-12-03 LAB — CBC
HCT: 31 % — ABNORMAL LOW (ref 40.0–52.0)
Hemoglobin: 10.7 g/dL — ABNORMAL LOW (ref 13.0–18.0)
MCH: 31.2 pg (ref 26.0–34.0)
MCHC: 34.4 g/dL (ref 32.0–36.0)
MCV: 90.7 fL (ref 80.0–100.0)
Platelets: 249 10*3/uL (ref 150–440)
RBC: 3.42 MIL/uL — AB (ref 4.40–5.90)
RDW: 12.9 % (ref 11.5–14.5)
WBC: 7.5 10*3/uL (ref 3.8–10.6)

## 2016-12-03 LAB — PROTEIN, PLEURAL OR PERITONEAL FLUID

## 2016-12-03 LAB — LACTATE DEHYDROGENASE, PLEURAL OR PERITONEAL FLUID: LD, Fluid: 87 U/L — ABNORMAL HIGH (ref 3–23)

## 2016-12-03 LAB — AMYLASE, PLEURAL OR PERITONEAL FLUID: Amylase, Fluid: 23 U/L

## 2016-12-03 MED ORDER — ADULT MULTIVITAMIN W/MINERALS CH
1.0000 | ORAL_TABLET | Freq: Every day | ORAL | Status: DC
  Administered 2016-12-03 – 2016-12-04 (×2): 1 via ORAL
  Filled 2016-12-03: qty 1

## 2016-12-03 MED ORDER — ENSURE ENLIVE PO LIQD
237.0000 mL | Freq: Three times a day (TID) | ORAL | Status: DC
  Administered 2016-12-03 – 2016-12-04 (×2): 237 mL via ORAL

## 2016-12-03 NOTE — Care Management Obs Status (Signed)
[facility] NOTIFICATION   Patient Details  Name: Juan Mayo MRN: [id number] Date of Birth: June 10, 1921   Medicare Observation Status Notification Given:  Yes Notice signed, one given to patient and the other to HIM for scanning  Katrina Stack, RN 12/03/2016, 10:17 AM

## 2016-12-03 NOTE — Progress Notes (Signed)
Talked to Dr. Benjie Karvonen about patient's thoracentesis today, order to hold scheduled plavix this morning. No other concern at the moment. RN will continue to monitor.

## 2016-12-03 NOTE — Care Management (Signed)
CM screen due to age.  Patient lives at home alone. He does not miss any appointments due to lack of transporation.  His sister assists with this.  He denies any issues with obtaining medications.  He is current with his pcp.  Patient does not have any services in the home.  He would be agreeable to consider home health services if it was needed. He says years ago when he had open heart surgery he had to have fluid drained out of his chest.  Says he has never been told he had congestive heart failure.  Awaiting thoracentesis

## 2016-12-03 NOTE — Procedures (Signed)
  Procedure:   [place] R thoracentesis   Preprocedure diagnosis:  Effusion Postprocedure diagnosis:  same EBL:     minimal Complications:   none immediate  See full dictation in [employer].  Dillard Cannon MD Main # [phone] Pager  [phone]

## 2016-12-03 NOTE — Progress Notes (Signed)
[facility] Cardiology [facility] Encounter Note  Patient: Juan Mayo / Admit Date: 12/02/2016 / Date of Encounter: 12/03/2016, 8:36 AM   Subjective: Patient feeling better today. No evidence of long runs of palpitations. Patient still has pleural effusion possibly consistent with congestive heart failure or valvular heart disease. Telemetry shows PVCs but no long runs of SVT. No evidence of myocardial infarction  Review of Systems: Positive for: Shortness of breath palpitations Negative for: Vision change, hearing change, syncope, dizziness, nausea, vomiting,diarrhea, bloody stool, stomach pain, cough, congestion, diaphoresis, urinary frequency, urinary pain,skin lesions, skin rashes Others previously listed  Objective: Telemetry: Frequent preventricular contractions otherwise normal sinus rhythm Physical Exam: Blood pressure (!) 144/48, pulse 93, temperature (!) 97.5 F (36.4 C), temperature source Oral, resp. rate 18, height 5\' 7"  (1.702 m), weight 53.4 kg (117 lb 11.2 oz), SpO2 95 %. Body mass index is 18.43 kg/m. General: Well developed, well nourished, in no acute distress. Head: Normocephalic, atraumatic, sclera non-icteric, no xanthomas, nares are without discharge. Neck: No apparent masses Lungs: Normal respirations with no wheezes, no rhonchi, no rales , few crackles decreased breath sounds on the right   Heart: Regular rate and rhythm, normal S1 S2, no 2+ aortic murmur, no rub, no gallop, PMI is normal size and placement, carotid upstroke normal without bruit, jugular venous pressure normal Abdomen: Soft, non-tender, non-distended with normoactive bowel sounds. No hepatosplenomegaly. Abdominal aorta is normal size without bruit Extremities: Trace edema, no clubbing, no cyanosis, no ulcers,  Peripheral: 2+ radial, 2+ femoral, 2+ dorsal pedal pulses Neuro: Alert and oriented. Moves all extremities spontaneously. Psych:  Responds to questions appropriately with a normal  affect.   Intake/Output Summary (Last 24 hours) at 12/03/16 0836 Last data filed at 12/03/16 0542  Gross per 24 hour  Intake              240 ml  Output              750 ml  Net             -510 ml    Inpatient Medications:  . atorvastatin  40 mg Oral QHS  . clopidogrel  75 mg Oral Daily  . gabapentin  300 mg Oral BID  . metoprolol tartrate  25 mg Oral BID  . sodium chloride flush  3 mL Intravenous Q12H  . sodium chloride flush  3 mL Intravenous Q12H   Infusions:  . sodium chloride      Labs:  Recent Labs  12/02/16 1004 12/03/16 0340  NA 139 139  K 4.3 4.1  CL 103 104  CO2 29 30  GLUCOSE 82 79  BUN 11 12  CREATININE 0.66 0.62  CALCIUM 8.2* 8.1*    Recent Labs  12/02/16 1004  AST 17  ALT 15*  ALKPHOS 79  BILITOT 0.9  PROT 6.2*  ALBUMIN 3.0*    Recent Labs  12/02/16 1004 12/03/16 0340  WBC 6.9 7.5  HGB 10.8* 10.7*  HCT 31.6* 31.0*  MCV 91.2 90.7  PLT 256 249    Recent Labs  12/02/16 1004 12/02/16 1544 12/02/16 2158  TROPONINI <0.03 <0.03 <0.03   Invalid input(s): POCBNP No results for input(s): HGBA1C in the last 72 hours.   Weights: Filed Weights   12/02/16 0910 12/02/16 1430 12/03/16 0351  Weight: 50.8 kg (112 lb) 53 kg (116 lb 14.4 oz) 53.4 kg (117 lb 11.2 oz)     Radiology/Studies:  Dg Chest Portable 1 View  Result Date: 12/02/2016  CLINICAL DATA:  Shortness of breath. EXAM: PORTABLE CHEST 1 VIEW COMPARISON:  03/18/2016 FINDINGS: Normal heart size. Previous median sternotomy and CABG procedure. There is a moderate right pleural effusion with overlying atelectasis. No interstitial edema. IMPRESSION: 1. Moderate right pleural effusion with overlying atelectasis. Electronically Signed   By: Kerby Moors M.D.   On: 12/02/2016 09:35     Assessment and Recommendation  83 y.o. male with known coronary artery disease status coronary artery bypass graft and aortic valve replacement on appropriate medication management for further risk  reduction having palpitations irregular heart beat previously assessed by Holter monitor to be benign preventricular contractions and short runs of SVT now with pleural effusion and weakness without evidence of myocardial infarction 1. Thoracentesis for further evaluation and treatment of possible shortness of breath and heart failure 2. Echocardiogram for LV systolic dysfunction valvular heart disease changes consistent with symptoms listed above 3. Metoprolol for short runs of supraventricular tachycardia and preventricular contractions 4. High intensity cholesterol therapy with atorvastatin for coronary artery disease 5. Platelet with Plavix 6. Further treatment options after above  Signed, Serafina Royals M.D. FACC

## 2016-12-03 NOTE — Plan of Care (Signed)
Problem: Safety: Goal: Ability to remain free from injury will improve Outcome: Progressing  Patient's VSS throughout the shift. Patient complaints of pain but refused medication. Patient had a thoracentesis today. RN will continue to monitor.

## 2016-12-03 NOTE — Progress Notes (Signed)
[facility] NAME: Juan Mayo    MR#:  [id number]  DATE OF BIRTH:  1922-03-10  SUBJECTIVE:   Patient presented with shortness of breath and not feeling well.  REVIEW OF SYSTEMS:    Review of Systems  Constitutional: Negative for fever, chills weight loss Positive generalized weakness HENT: Negative for ear pain, nosebleeds, congestion, facial swelling, rhinorrhea, neck pain, neck stiffness and ear discharge.   Respiratory: Negative for cough, Positive shortness of breath, no wheezing  Cardiovascular: Negative for chest pain, palpitations and leg swelling.  Gastrointestinal: Negative for heartburn, abdominal pain, vomiting, diarrhea or consitpation Genitourinary: Negative for dysuria, urgency, frequency, hematuria Musculoskeletal: Negative for back pain or joint pain Neurological: Negative for dizziness, seizures, syncope, focal weakness,  numbness and headaches.  Hematological: Does not bruise/bleed easily.  Psychiatric/Behavioral: Negative for hallucinations, confusion, dysphoric mood    Tolerating Diet: yes      DRUG ALLERGIES:  No Known Allergies  VITALS:  Blood pressure (!) 101/47, pulse 78, temperature 97.9 F (36.6 C), temperature source Oral, resp. rate 18, height 5\' 7"  (1.702 m), weight 53.4 kg (117 lb 11.2 oz), SpO2 95 %.  PHYSICAL EXAMINATION:  Constitutional: Appears well-developed and well-nourished. No distress. HENT: Normocephalic. Marland Kitchen Oropharynx is clear and moist.  Eyes: Conjunctivae and EOM are normal. PERRLA, no scleral icterus.  Neck: Normal ROM. Neck supple. No JVD. No tracheal deviation. CVS: RRR, S1/S2 +, 3/6 SEM no gallops, no carotid bruit.  Pulmonary:Decreased breath sounds right side without crackles Abdominal: Soft. BS +,  no distension, tenderness, rebound or guarding.  Musculoskeletal: Normal range of motion. No edema and no tenderness.  Neuro: Alert. CN 2-12 grossly intact. No focal  deficits. Skin: Skin is warm and dry. No rash noted. Psychiatric: Normal mood and affect.      LABORATORY PANEL:   CBC  Recent Labs Lab 12/03/16 0340  WBC 7.5  HGB 10.7*  HCT 31.0*  PLT 249   ------------------------------------------------------------------------------------------------------------------  Chemistries   Recent Labs Lab 12/02/16 1004 12/03/16 0340  NA 139 139  K 4.3 4.1  CL 103 104  CO2 29 30  GLUCOSE 82 79  BUN 11 12  CREATININE 0.66 0.62  CALCIUM 8.2* 8.1*  AST 17  --   ALT 15*  --   ALKPHOS 79  --   BILITOT 0.9  --    ------------------------------------------------------------------------------------------------------------------  Cardiac Enzymes  Recent Labs Lab 12/02/16 1004 12/02/16 1544 12/02/16 2158  TROPONINI <0.03 <0.03 <0.03   ------------------------------------------------------------------------------------------------------------------  RADIOLOGY:  Dg Chest Portable 1 View  Result Date: 12/02/2016 CLINICAL DATA:  Shortness of breath. EXAM: PORTABLE CHEST 1 VIEW COMPARISON:  03/18/2016 FINDINGS: Normal heart size. Previous median sternotomy and CABG procedure. There is a moderate right pleural effusion with overlying atelectasis. No interstitial edema. IMPRESSION: 1. Moderate right pleural effusion with overlying atelectasis. Electronically Signed   By: Kerby Moors M.D.   On: 12/02/2016 09:35     ASSESSMENT AND PLAN:   83 year old male with history of coronary artery disease and aortic valve stenosis status post aortic valve replacement who presents with shortness of breath and palpitations and found to have moderate size right pleural effusion.  1. Moderate size right pleural effusion with atelectasis: Ultrasound guided thoracentesis with diagnostics requested  2. Palpitations consistent with patient's past history of SVT: Patient and evaluated by cardiology. Continue metoprolol  3. CAD/CABG: Continue  atorvastatin, Plavix, metoprolol   4. Anemia chronic disease with stable hemoglobin   PT  consultation for disposition  Management plans discussed with the patient and he is in agreement.  CODE STATUS: full  TOTAL TIME TAKING CARE OF THIS PATIENT: 30 minutes.     POSSIBLE D/C tomorrow, DEPENDING ON CLINICAL CONDITION.   Ella Guillotte M.D on 12/03/2016 at 8:07 AM  Between 7am to 6pm - Pager - [phone] After 6pm go to [email or web address] - password [email or web address] [facility]  Office  [phone]  CC: Primary care physician; Leonel Ramsay, MD  Note: This dictation was prepared with Dragon dictation along with smaller phrase technology. Any transcriptional errors that result from this process are unintentional.

## 2016-12-03 NOTE — Consult Note (Signed)
Pulmonary Critical Care  Initial Consult Note   Juan Mayo BMW:[id number] DOB: [birth year] DOA: 12/02/2016  Referring physician: Hillary Bow, MD PCP: Leonel Ramsay, MD   Chief Complaint: Pleural Effusion  HPI: Juan Mayo is a 83 y.o. male  With prior history of coronary artery disease had a CABG in 2006 and valve replacement presented to the hospital with increasing shortness of breath.  Patient had been having nausea and some vomiting.  Patient was also having some palpitations at that time.  The patient had an x-ray done this showed presence of a moderate-sized pleural effusion.  The effusion appear to be on the right side mostly.   A review of his prior films show that in 2017 patient did not have any significant fluid on that side.  Patient denies having a cough no congestion no fevers.  Patient has not had any chest pain noted prior to admission.   Review of Systems:  Constitutional:  No weight loss, night sweats, Fevers, chills, fatigue.  HEENT:  No headaches, nasal congestion, post nasal drip,  Cardio-vascular:  No chest pain, Orthopnea, PND, swelling in lower extremities, anasarca, dizziness, palpitations  GI:  No heartburn, indigestion, abdominal pain, nausea, vomiting, diarrhea  Resp:  +shortness of breath no productive cough, No coughing up of blood.No wheezing Skin:  no rash or lesions.  Musculoskeletal:  No joint pain or swelling.   Remainder ROS performed and is unremarkable other than noted in HPI  Past Medical History:  Diagnosis Date  . CAD (coronary artery disease)   . Hx of CABG   . Hypercholesteremia   . Neuropathy   . Prostate cancer ([place])   . Stroke ([place])   . SVT (supraventricular tachycardia) ([facility])    Past Surgical History:  Procedure Laterality Date  . AORTIC VALVE REPLACEMENT    . BACK SURGERY    . CORONARY ARTERY BYPASS GRAFT    . FRACTURE SURGERY Right   . INTRAMEDULLARY (IM) NAIL INTERTROCHANTERIC Right 03/18/2015   Procedure:  INTRAMEDULLARY (IM) NAIL INTERTROCHANTRIC;  Surgeon: [name], MD;  Location: [facility] ORS;  Service: Orthopedics;  Laterality: Right;  . PROSTATECTOMY    . REPLACEMENT TOTAL KNEE BILATERAL     Social History:  reports that he has quit smoking. His smoking use included Cigarettes. He has never used smokeless tobacco. He reports that he does not drink alcohol or use drugs.  No Known Allergies  Family History  Problem Relation Age of Onset  . Hypertension Other   . Parkinson's disease Mother   . Alzheimer's disease Father   . Cancer Sister   . Heart disease Brother   . Heart disease Brother   . Heart disease Brother     Prior to Admission medications   Medication Sig Start Date End Date Taking? Authorizing Provider  atorvastatin (LIPITOR) 40 MG tablet Take 1 tablet (40 mg total) by mouth daily at 6 PM. Patient taking differently: Take 40 mg by mouth at bedtime.  02/25/16  Yes Epifanio Lesches, MD  clopidogrel (PLAVIX) 75 MG tablet Take 1 tablet (75 mg total) by mouth daily. 02/26/16  Yes Epifanio Lesches, MD  gabapentin (NEURONTIN) 600 MG tablet Take 300 mg by mouth 2 (two) times daily.    Yes [provider]  metoprolol tartrate (LOPRESSOR) 25 MG tablet Take 25 mg by mouth 2 (two) times daily.   Yes [provider]  Multiple Vitamins-Minerals (CENTRUM SILVER) tablet Take 1 tablet by mouth daily.   Yes [provider]  Physical Exam: Vitals:   12/03/16 1100 12/03/16 1152 12/03/16 1921 12/03/16 2115  BP: (!) 126/57 (!) 118/57 (!) 117/42 (!) 110/49  Pulse: 71 67 93 81  Resp: 18 18 18    Temp:  98.1 F (36.7 C) 98.8 F (37.1 C)   TempSrc:   Oral   SpO2: 98% 91% 97%   Weight:      Height:        Wt Readings from Last 3 Encounters:  12/03/16 53.4 kg (117 lb 11.2 oz)  05/23/16 58.1 kg (128 lb)  03/27/16 45.5 kg (100 lb 4.8 oz)    General:  Appears calm and comfortable Eyes: PERRL, normal lids, irises & conjunctiva ENT: grossly normal  hearing, lips & tongue Neck: no LAD, masses or thyromegaly Cardiovascular: RRR, no m/r/g. No LE edema. Respiratory:   Diminished slightly on the right side no rhonchi or rales Abdomen: soft, nontender Skin: no rash or induration seen on limited exam Musculoskeletal: grossly normal tone BUE/BLE Psychiatric: grossly normal mood and affect Neurologic: grossly non-focal.          Labs on Admission:  Basic Metabolic Panel:  Recent Labs Lab 12/02/16 1004 12/03/16 0340  NA 139 139  K 4.3 4.1  CL 103 104  CO2 29 30  GLUCOSE 82 79  BUN 11 12  CREATININE 0.66 0.62  CALCIUM 8.2* 8.1*   Liver Function Tests:  Recent Labs Lab 12/02/16 1004  AST 17  ALT 15*  ALKPHOS 79  BILITOT 0.9  PROT 6.2*  ALBUMIN 3.0*   No results for input(s): LIPASE, AMYLASE in the last 168 hours. No results for input(s): AMMONIA in the last 168 hours. CBC:  Recent Labs Lab 12/02/16 1004 12/03/16 0340  WBC 6.9 7.5  HGB 10.8* 10.7*  HCT 31.6* 31.0*  MCV 91.2 90.7  PLT 256 249   Cardiac Enzymes:  Recent Labs Lab 12/02/16 1004 12/02/16 1544 12/02/16 2158  TROPONINI <0.03 <0.03 <0.03    BNP (last 3 results) No results for input(s): BNP in the last 8760 hours.  ProBNP (last 3 results) No results for input(s): PROBNP in the last 8760 hours.  CBG: No results for input(s): GLUCAP in the last 168 hours.  Radiological Exams on Admission: Dg Chest Port 1 View  Result Date: 12/03/2016 CLINICAL DATA:  S/p right thoracentesis. EXAM: PORTABLE CHEST - 1 VIEW COMPARISON:  the previous day's study FINDINGS: No pneumothorax. Interval decrease in right pleural effusion post thoracentesis with small residual. Improved aeration at the right lung base with some residual patchy airspace infiltrate or atelectasis laterally. Coarse interstitial opacities in the right lower lobe persist. Left lung clear. Heart size and mediastinal contours are within normal limits. Previous median sternotomy and CABG.  IMPRESSION: 1. No pneumothorax post right thoracentesis with small residual effusion. 2. Improved aeration in the right lower lobe with some residual interstitial and airspace disease as above. Electronically Signed   By: Lucrezia Europe M.D.   On: 12/03/2016 11:48   Dg Chest Portable 1 View  Result Date: 12/02/2016 CLINICAL DATA:  Shortness of breath. EXAM: PORTABLE CHEST 1 VIEW COMPARISON:  03/18/2016 FINDINGS: Normal heart size. Previous median sternotomy and CABG procedure. There is a moderate right pleural effusion with overlying atelectasis. No interstitial edema. IMPRESSION: 1. Moderate right pleural effusion with overlying atelectasis. Electronically Signed   By: Kerby Moors M.D.   On: 12/02/2016 09:35   US Thoracentesis Asp Pleural Space W/img Guide  Result Date: 12/03/2016 INDICATION: Shortness of breath common new right  pleural effusion. EXAM: ULTRASOUND GUIDED RIGHT THORACENTESIS MEDICATIONS: Lidocaine 1% subcutaneous COMPLICATIONS: None immediate. PROCEDURE: An ultrasound guided thoracentesis was thoroughly discussed with the patient and questions answered. The benefits, risks, alternatives and complications were also discussed. The patient understands and wishes to proceed with the procedure. Written consent was obtained. Ultrasound was performed to localize and mark an adequate pocket of fluid in the right chest. The area was then prepped and draped in the normal sterile fashion. 1% Lidocaine was used for local anesthesia. Under ultrasound guidance a Safe-T-Centesis catheter was introduced. Thoracentesis was performed. The catheter was removed and a dressing applied. FINDINGS: A total of approximately 900 mL of clear yellow fluid was removed. Samples were sent to the laboratory as requested by the clinical team. IMPRESSION: Successful ultrasound guided right thoracentesis yielding 900 mL of pleural fluid. Electronically Signed   By: Lucrezia Europe M.D.   On: 12/03/2016 11:26    EKG: Independently  reviewed.  Assessment/Plan Active Problems:   Pleural effusion   1.  acute on chronic Respiratory failure with hypoxia  patient had thoracentesis done he appears to be doing better now  We will continue with oxygen therapy as tolerated and wean down  Follow-up x-ray as necessary  2.  right-sided pleural effusion  the thoracentesis was done today fluid is more consistent with transudative Protein was low as well as normal range  Glucose Cytology results pending Will follow-up in the office and do a follow-up x-ray and see if any further action is necessary Suspect the fusion may be related to underlying cardiac disease  3.  shortness of breath  likely secondary to pleural effusion now is much improved    Code Status:  Full code Family Communication sister and brothers or in the room Disposition Plan:  home Time spent:  70 min    I have personally obtained a history, examined the patient, evaluated laboratory and imaging results, formulated the assessment and plan and placed orders.  The Patient requires high complexity decision making for assessment and support.    Allyne Gee, MD [facility] Pulmonary Critical Care Medicine Sleep Medicine

## 2016-12-03 NOTE — Progress Notes (Signed)
Nutrition Education Note  RD provided "Low Sodium Nutrition Therapy" handout from the Academy of [employer]. Reviewed patient's dietary recall. Provided examples on ways to decrease sodium intake in diet. Discouraged intake of processed foods and use of salt shaker. Encouraged fresh fruits and vegetables as well as whole grain sources of carbohydrates to maximize fiber intake.   RD discussed why it is important for patient to adhere to diet recommendations, and emphasized the role of fluids, foods to avoid, and importance of weighing self daily. Teach back method used.  Expect good compliance.  Body mass index is 18.43 kg/m. Pt meets criteria for underweight based on current BMI.  Current diet order is heart healthy, patient is consuming approximately 100% of meals at this time. Labs and medications reviewed.   RD following this pt  Koleen Distance MS, RD, LDN Pager #[phone] After Hours Pager: [phone]

## 2016-12-03 NOTE — Evaluation (Signed)
Physical Therapy Evaluation Patient Details Name: Juan Mayo MRN: [id number] DOB: 1921-06-04 Today's Date: 12/03/2016   History of Present Illness  Pt admitted for pleural effusion. Pt now s/p thoracentesis on 12/03/16. Pt complains of SOB symptoms. History includes CAD s/p CABG, aortic valve replacement, CVA, and prostate cancer.   Clinical Impression  Pt is a pleasant 83 year old male who was admitted for pleural effusion. Pt performs bed mobility with independence and transfers/ambulation with cga and no AD. Pt demonstrates deficits with endurance/balance. Would benefit from skilled PT to address above deficits and promote optimal return to PLOF. Recommend OP PT at this time for further follow up following discharge from acute stay.      Follow Up Recommendations Outpatient PT    Equipment Recommendations  None recommended by PT    Recommendations for Other Services       Precautions / Restrictions Precautions Precautions: Fall Restrictions Weight Bearing Restrictions: No      Mobility  Bed Mobility Overal bed mobility: Independent             General bed mobility comments: safe technique performed with upright posture noted  Transfers Overall transfer level: Needs assistance Equipment used: None Transfers: Sit to/from Stand Sit to Stand: Min guard         General transfer comment: safe technique performed, however slight posterior leaning noted upon first attempt, however able to self correct  Ambulation/Gait Ambulation/Gait assistance: Min guard Ambulation Distance (Feet): 100 Feet Assistive device: None Gait Pattern/deviations: Step-through pattern     General Gait Details: Pt ambulated in hallway with use of AD. Slight limp noted on R leg secondary to leg length discrepency. Slow gait pattern noted  Stairs            Wheelchair Mobility    Modified Rankin (Stroke Patients Only)       Balance Overall balance assessment: Needs  assistance Sitting-balance support: Feet supported Sitting balance-Leahy Scale: Normal     Standing balance support: No upper extremity supported Standing balance-Leahy Scale: Good                               Pertinent Vitals/Pain Pain Assessment: No/denies pain    Home Living Family/patient expects to be discharged to:: Private residence Living Arrangements: Alone   Type of Home: House Home Access: Stairs to enter Entrance Stairs-Rails: None Entrance Stairs-Number of Steps: 3 Home Layout: One level Home Equipment: Environmental consultant - 2 wheels      Prior Function Level of Independence: Independent         Comments: per sister, was mowing yard recently, very active     Hand Dominance        Extremity/Trunk Assessment   Upper Extremity Assessment Upper Extremity Assessment: Overall WFL for tasks assessed    Lower Extremity Assessment Lower Extremity Assessment: Overall WFL for tasks assessed       Communication   Communication: No difficulties  Cognition Arousal/Alertness: Awake/alert Behavior During Therapy: WFL for tasks assessed/performed Overall Cognitive Status: Within Functional Limits for tasks assessed                                        General Comments      Exercises     Assessment/Plan    PT Assessment Patient needs continued PT services  PT Problem List  Decreased balance;Decreased mobility       PT Treatment Interventions Gait training;Balance training    PT Goals (Current goals can be found in the Care Plan section)  Acute Rehab PT Goals Patient Stated Goal: to go home and mow grass PT Goal Formulation: With patient Time For Goal Achievement: 12/17/16 Potential to Achieve Goals: Good    Frequency Min 2X/week   Barriers to discharge        Co-evaluation               AM-PAC PT "6 Clicks" Daily Activity  Outcome Measure Difficulty turning over in bed (including adjusting bedclothes, sheets  and blankets)?: None Difficulty moving from lying on back to sitting on the side of the bed? : None Difficulty sitting down on and standing up from a chair with arms (e.g., wheelchair, bedside commode, etc,.)?: Unable Help needed moving to and from a bed to chair (including a wheelchair)?: None Help needed walking in hospital room?: None Help needed climbing 3-5 steps with a railing? : None 6 Click Score: 21    End of Session Equipment Utilized During Treatment: Gait belt Activity Tolerance: Patient tolerated treatment well Patient left: in chair;with chair alarm set;with family/visitor present Nurse Communication: Mobility status PT Visit Diagnosis: Unsteadiness on feet (R26.81)    Time: 3662-9476 PT Time Calculation (min) (ACUTE ONLY): 20 min   Charges:   PT Evaluation $PT Eval Low Complexity: 1 Low     PT G Codes:   PT G-Codes **NOT FOR INPATIENT CLASS** Functional Assessment Tool Used: AM-PAC 6 Clicks Basic Mobility Functional Limitation: Mobility: Walking and moving around Mobility: Walking and Moving Around Current Status (L4650): At least 20 percent but less than 40 percent impaired, limited or restricted Mobility: Walking and Moving Around Goal Status [phone]): At least 1 percent but less than 20 percent impaired, limited or restricted    Juan Mayo, PT, DPT [phone]   Juan Mayo 12/03/2016, 4:32 PM

## 2016-12-03 NOTE — Progress Notes (Signed)
Initial Nutrition Assessment  DOCUMENTATION CODES:   Severe malnutrition in context of chronic illness  INTERVENTION:   Ensure Enlive po TID, each supplement provides 350 kcal and 20 grams of protein  Magic cup TID with meals, each supplement provides 290 kcal and 9 grams of protein  MVI  NUTRITION DIAGNOSIS:   Malnutrition (severe) related to  (heart disease ) as evidenced by severe depletion of body fat, severe depletion of muscle mass.  GOAL:   Patient will meet greater than or equal to 90% of their needs  MONITOR:   PO intake, Supplement acceptance, Labs, Weight trends, I & O's  REASON FOR ASSESSMENT:   Other (Comment) (Low BMI)    ASSESSMENT:   83 year old male with history of coronary artery disease and aortic valve stenosis status post aortic valve replacement who presents with shortness of breath and palpitations and found to have moderate size right pleural effusion.   Met with pt and pt's sister in room today. Pt reports poor appetite for the past 6 months r/t a feeling of immediate fullness after eating just a few bites. Pt reports that today is the first day that he has really had an appetite in many months. Pt reports that he has lost quite a bit of weight but is unsure how much. Per chart, it appears that pt has lost 11lbs(9%) in 6 months; this is significant given history. RD is unsure if pt's admit weight is accurate as pt with pleural effusion; pt may actually have lost more weight than charted. Pt was eating at time of RD visit today. Pt documented to be eating 100% of meals. Pt does not drink supplements at home but is willing to try Ensure; RD encouraged supplements as pt is malnourished. Pt s/p thoracentesis today.  Medications reviewed and include: plavix, lipitor   Labs reviewed: Ca 8.1(L)  Nutrition-Focused physical exam completed. Findings are severe fat and muscle depletions over entire body, and no edema.   Diet Order:  Diet Heart Room service  appropriate? Yes; Fluid consistency: Thin  Skin:  Wound (see comment) (Incision back )  Last BM:  9/3  Height:   Ht Readings from Last 1 Encounters:  12/02/16 _0  (1.702 m)    Weight:   Wt Readings from Last 1 Encounters:  12/03/16 117 lb 11.2 oz (53.4 kg)    Ideal Body Weight:  67.2 kg  BMI:  Body mass index is 18.43 kg/m.  Estimated Nutritional Needs:   Kcal:  1600-1900kcal/day   Protein:  85-95g/day   Fluid:  >1.6L/day   EDUCATION NEEDS:   Education needs addressed  Koleen Distance MS, RD, LDN Pager #[phone] After Hours Pager: [phone]

## 2016-12-04 DIAGNOSIS — J90 Pleural effusion, not elsewhere classified: Secondary | ICD-10-CM | POA: Diagnosis not present

## 2016-12-04 DIAGNOSIS — E43 Unspecified severe protein-calorie malnutrition: Secondary | ICD-10-CM | POA: Insufficient documentation

## 2016-12-04 DIAGNOSIS — I5032 Chronic diastolic (congestive) heart failure: Secondary | ICD-10-CM | POA: Diagnosis not present

## 2016-12-04 DIAGNOSIS — I2510 Atherosclerotic heart disease of native coronary artery without angina pectoris: Secondary | ICD-10-CM | POA: Diagnosis not present

## 2016-12-04 DIAGNOSIS — D649 Anemia, unspecified: Secondary | ICD-10-CM | POA: Diagnosis not present

## 2016-12-04 DIAGNOSIS — I479 Paroxysmal tachycardia, unspecified: Secondary | ICD-10-CM | POA: Diagnosis not present

## 2016-12-04 DIAGNOSIS — R002 Palpitations: Secondary | ICD-10-CM | POA: Diagnosis not present

## 2016-12-04 LAB — BASIC METABOLIC PANEL
Anion gap: 4 — ABNORMAL LOW (ref 5–15)
BUN: 13 mg/dL (ref 6–20)
CHLORIDE: 101 mmol/L (ref 101–111)
CO2: 31 mmol/L (ref 22–32)
CREATININE: 0.68 mg/dL (ref 0.61–1.24)
Calcium: 8 mg/dL — ABNORMAL LOW (ref 8.9–10.3)
GFR calc Af Amer: >60 mL/min (ref >60)
GLUCOSE: 92 mg/dL (ref 65–99)
POTASSIUM: 4.2 mmol/L (ref 3.5–5.1)
SODIUM: 136 mmol/L (ref 135–145)

## 2016-12-04 LAB — MISC LABCORP TEST (SEND OUT): LABCORP TEST CODE: 19588

## 2016-12-04 MED ORDER — FUROSEMIDE 20 MG PO TABS: 20.0000 mg | ORAL_TABLET | Freq: Every day | ORAL | 1 refills | Status: AC

## 2016-12-04 MED ORDER — POTASSIUM CHLORIDE ER 10 MEQ PO TBCR: 10.0000 meq | EXTENDED_RELEASE_TABLET | Freq: Every day | ORAL | 1 refills | Status: AC

## 2016-12-04 NOTE — Discharge Summary (Signed)
[facility] NAME: Juan Mayo    MR#:  [id number]  DATE OF BIRTH:  Aug 03, 1921  DATE OF ADMISSION:  12/02/2016 ADMITTING PHYSICIAN: Hillary Bow, MD  DATE OF DISCHARGE: 12/04/2016 11:47 AM  PRIMARY CARE PHYSICIAN: Leonel Ramsay, MD    ADMISSION DIAGNOSIS:  SOB (shortness of breath) [R06.02] Pleural effusion [J90] Paroxysmal SVT (supraventricular tachycardia) (HCC) [I47.1]  DISCHARGE DIAGNOSIS:  Active Problems:   Pleural effusion   Protein-calorie malnutrition, severe   SECONDARY DIAGNOSIS:   Past Medical History:  Diagnosis Date  . CAD (coronary artery disease)   . Hx of CABG   . Hypercholesteremia   . Neuropathy   . Prostate cancer ([place])   . Stroke ([place])   . SVT (supraventricular tachycardia) [facility])     HOSPITAL COURSE:   83 year old male with past medical history of prostate cancer, SVT, history of previous CVA, coronary disease status post bypass, hyperlipidemia who presented to the hospital due to shortness of breath.  1.acute respiratory failure with hypoxia-secondary to a moderately sized right-sided pleural effusion. -Patient underwent diagnostic and therapeutic right-sided thoracentesis. Clinically much improved and no longer hypoxic. Fluid analysis consistent with transudative and more likely related to CHF. -Patient was discharged on some Lasix daily.  2. Palpitations/SVT-patient will continue his oral metoprolol. He had no further episodes of SVT. Appreciate cardiology input and continue current care and will follow up with him as an outpatient.  3. History of coronary disease status post bypass-continue Plavix, metoprolol, atorvastatin.  4. Neuropathy-continue gabapentin.  DISCHARGE CONDITIONS:   Stable.   CONSULTS OBTAINED:  Treatment Team:  Corey Skains, MD Allyne Gee, MD  DRUG ALLERGIES:  No Known Allergies  DISCHARGE MEDICATIONS:   Allergies as of 12/04/2016   No Known  Allergies     Medication List    TAKE these medications   atorvastatin 40 MG tablet Commonly known as:  LIPITOR Take 1 tablet (40 mg total) by mouth daily at 6 PM. What changed:  when to take this   CENTRUM SILVER tablet Take 1 tablet by mouth daily.   clopidogrel 75 MG tablet Commonly known as:  PLAVIX Take 1 tablet (75 mg total) by mouth daily.   furosemide 20 MG tablet Commonly known as:  LASIX Take 1 tablet (20 mg total) by mouth daily.   gabapentin 600 MG tablet Commonly known as:  NEURONTIN Take 300 mg by mouth 2 (two) times daily.   metoprolol tartrate 25 MG tablet Commonly known as:  LOPRESSOR Take 25 mg by mouth 2 (two) times daily.   potassium chloride 10 MEQ tablet Commonly known as:  K-DUR Take 1 tablet (10 mEq total) by mouth daily.            Discharge Care Instructions        Start     Ordered   12/04/16 0000  furosemide (LASIX) 20 MG tablet  Daily     12/04/16 0959   12/04/16 0000  potassium chloride (K-DUR) 10 MEQ tablet  Daily     12/04/16 0959   12/04/16 0000  Activity as tolerated - No restrictions     12/04/16 0959   12/04/16 0000  Diet - low sodium heart healthy     12/04/16 0959        DISCHARGE INSTRUCTIONS:   DIET:  Cardiac diet  DISCHARGE CONDITION:  Stable  ACTIVITY:  Activity as tolerated  OXYGEN:  Home Oxygen: No.   Oxygen Delivery:  room air  DISCHARGE LOCATION:  home   If you experience worsening of your admission symptoms, develop shortness of breath, life threatening emergency, suicidal or homicidal thoughts you must seek medical attention immediately by calling 911 or calling your MD immediately  if symptoms less severe.  You Must read complete instructions/literature along with all the possible adverse reactions/side effects for all the Medicines you take and that have been prescribed to you. Take any new Medicines after you have completely understood and accpet all the possible adverse reactions/side  effects.   Please note  You were cared for by a hospitalist during your hospital stay. If you have any questions about your discharge medications or the care you received while you were in the hospital after you are discharged, you can call the unit and asked to speak with the hospitalist on call if the hospitalist that took care of you is not available. Once you are discharged, your primary care physician will handle any further medical issues. Please note that NO REFILLS for any discharge medications will be authorized once you are discharged, as it is imperative that you return to your primary care physician (or establish a relationship with a primary care physician if you do not have one) for your aftercare needs so that they can reassess your need for medications and monitor your lab values.     Today   SOB improved.  No other complaints. Friend at bedside and wants to go home.   VITAL SIGNS:  Blood pressure (!) 108/54, pulse 91, temperature (!) 97.5 F (36.4 C), temperature source Oral, resp. rate (!) 27, height 5\' 7"  (1.702 m), weight 52.3 kg (115 lb 3.2 oz), SpO2 96 %.  I/O:   Intake/Output Summary (Last 24 hours) at 12/04/16 1542 Last data filed at 12/04/16 0959  Gross per 24 hour  Intake              480 ml  Output              350 ml  Net              130 ml    PHYSICAL EXAMINATION:  GENERAL:  83 y.o.-year-old cachectic patient lying in the bed in no acute distress.  EYES: Pupils equal, round, reactive to light and accommodation. No scleral icterus. Extraocular muscles intact.  HEENT: Head atraumatic, normocephalic. Oropharynx and nasopharynx clear.  NECK:  Supple, no jugular venous distention. No thyroid enlargement, no tenderness.  LUNGS: Normal breath sounds bilaterally, no wheezing, rales,rhonchi. No use of accessory muscles of respiration.  CARDIOVASCULAR: S1, S2 normal. No murmurs, rubs, or gallops.  ABDOMEN: Soft, non-tender, non-distended. Bowel sounds present. No  organomegaly or mass.  EXTREMITIES: No pedal edema, cyanosis, or clubbing.  NEUROLOGIC: Cranial nerves II through XII are intact. No focal motor or sensory defecits b/l.  PSYCHIATRIC: The patient is alert and oriented x 3.  SKIN: No obvious rash, lesion, or ulcer.   DATA REVIEW:   CBC  Recent Labs Lab 12/03/16 0340  WBC 7.5  HGB 10.7*  HCT 31.0*  PLT 249    Chemistries   Recent Labs Lab 12/02/16 1004  12/04/16 0403  NA 139  < > 136  K 4.3  < > 4.2  CL 103  < > 101  CO2 29  < > 31  GLUCOSE 82  < > 92  BUN 11  < > 13  CREATININE 0.66  < > 0.68  CALCIUM 8.2*  < > 8.0*  AST 17  --   --   ALT 15*  --   --   ALKPHOS 79  --   --   BILITOT 0.9  --   --   < > = values in this interval not displayed.  Cardiac Enzymes  Recent Labs Lab 12/02/16 2158  TROPONINI <0.03    Microbiology Results  Results for orders placed or performed during the hospital encounter of 12/02/16  Body fluid culture     Status: None (Preliminary result)   Collection Time: 12/03/16 11:00 AM  Result Value Ref Range Status   Specimen Description PLEURAL  Final   Special Requests NONE  Final   Gram Stain   Final    FEW WBC PRESENT,BOTH PMN AND MONONUCLEAR NO ORGANISMS SEEN    Culture   Final    NO GROWTH < 24 HOURS Performed at [facility], [place] [address] [place], [place] [address]    Report Status PENDING  Incomplete    RADIOLOGY:  Dg Chest Port 1 View  Result Date: 12/03/2016 CLINICAL DATA:  S/p right thoracentesis. EXAM: PORTABLE CHEST - 1 VIEW COMPARISON:  the previous day's study FINDINGS: No pneumothorax. Interval decrease in right pleural effusion post thoracentesis with small residual. Improved aeration at the right lung base with some residual patchy airspace infiltrate or atelectasis laterally. Coarse interstitial opacities in the right lower lobe persist. Left lung clear. Heart size and mediastinal contours are within normal limits. Previous median sternotomy and CABG.  IMPRESSION: 1. No pneumothorax post right thoracentesis with small residual effusion. 2. Improved aeration in the right lower lobe with some residual interstitial and airspace disease as above. Electronically Signed   By: Lucrezia Europe M.D.   On: 12/03/2016 11:48   US Thoracentesis Asp Pleural Space W/img Guide  Result Date: 12/03/2016 INDICATION: Shortness of breath common new right pleural effusion. EXAM: ULTRASOUND GUIDED RIGHT THORACENTESIS MEDICATIONS: Lidocaine 1% subcutaneous COMPLICATIONS: None immediate. PROCEDURE: An ultrasound guided thoracentesis was thoroughly discussed with the patient and questions answered. The benefits, risks, alternatives and complications were also discussed. The patient understands and wishes to proceed with the procedure. Written consent was obtained. Ultrasound was performed to localize and mark an adequate pocket of fluid in the right chest. The area was then prepped and draped in the normal sterile fashion. 1% Lidocaine was used for local anesthesia. Under ultrasound guidance a Safe-T-Centesis catheter was introduced. Thoracentesis was performed. The catheter was removed and a dressing applied. FINDINGS: A total of approximately 900 mL of clear yellow fluid was removed. Samples were sent to the laboratory as requested by the clinical team. IMPRESSION: Successful ultrasound guided right thoracentesis yielding 900 mL of pleural fluid. Electronically Signed   By: Lucrezia Europe M.D.   On: 12/03/2016 11:26      Management plans discussed with the patient, family and they are in agreement.  CODE STATUS:  Code Status History    Date Active Date Inactive Code Status Order ID Comments User Context   12/02/2016  1:08 PM 12/04/2016  2:52 PM Full Code [id number]  Hillary Bow, MD ED    TOTAL TIME TAKING CARE OF THIS PATIENT: 40 minutes.    Henreitta Leber M.D on 12/04/2016 at 3:42 PM  Between 7am to 6pm - Pager - [phone]  After 6pm go to [email or web address] - [employer]  [facility]  Office  [phone]  CC: Primary care physician; Leonel Ramsay, MD

## 2016-12-04 NOTE — Progress Notes (Signed)
Patient is discharge home in a stable condition, summary and f/u care given to both pt and sister , verbalized understanding .

## 2016-12-04 NOTE — Progress Notes (Signed)
[facility] Cardiology [facility] Encounter Note  Patient: Juan Mayo / Admit Date: 12/02/2016 / Date of Encounter: 12/04/2016, 8:19 AM   Subjective: Patient feeling better today. No evidence of long runs of palpitations but does have continued episodes of preventricular contractions. Pleural effusion with thoracentesis and improved breathing most consistent with the cardiac origin. Telemetry shows PVCs but no long runs of SVT. No evidence of myocardial infarction  Review of Systems: Positive for: Shortness of breath palpitations Negative for: Vision change, hearing change, syncope, dizziness, nausea, vomiting,diarrhea, bloody stool, stomach pain, cough, congestion, diaphoresis, urinary frequency, urinary pain,skin lesions, skin rashes Others previously listed  Objective: Telemetry: Frequent preventricular contractions otherwise normal sinus rhythm Physical Exam: Blood pressure (!) 102/45, pulse 62, temperature (!) 97.4 F (36.3 C), temperature source Oral, resp. rate 18, height 5\' 7"  (1.702 m), weight 52.3 kg (115 lb 3.2 oz), SpO2 98 %. Body mass index is 18.04 kg/m. General: Well developed, well nourished, in no acute distress. Head: Normocephalic, atraumatic, sclera non-icteric, no xanthomas, nares are without discharge. Neck: No apparent masses Lungs: Normal respirations with no wheezes, no rhonchi, no rales , few crackles    Heart: Regular rate and rhythm, normal S1 S2, no 2+ aortic murmur, no rub, no gallop, PMI is normal size and placement, carotid upstroke normal without bruit, jugular venous pressure normal Abdomen: Soft, non-tender, non-distended with normoactive bowel sounds. No hepatosplenomegaly. Abdominal aorta is normal size without bruit Extremities: Trace edema, no clubbing, no cyanosis, no ulcers,  Peripheral: 2+ radial, 2+ femoral, 2+ dorsal pedal pulses Neuro: Alert and oriented. Moves all extremities spontaneously. Psych:  Responds to questions appropriately with a  normal affect.   Intake/Output Summary (Last 24 hours) at 12/04/16 0819 Last data filed at 12/04/16 0500  Gross per 24 hour  Intake              600 ml  Output              550 ml  Net               50 ml    Inpatient Medications:  . atorvastatin  40 mg Oral QHS  . clopidogrel  75 mg Oral Daily  . feeding supplement (ENSURE ENLIVE)  237 mL Oral TID BM  . gabapentin  300 mg Oral BID  . metoprolol tartrate  25 mg Oral BID  . multivitamin with minerals  1 tablet Oral Daily  . sodium chloride flush  3 mL Intravenous Q12H  . sodium chloride flush  3 mL Intravenous Q12H   Infusions:  . sodium chloride      Labs:  Recent Labs  12/03/16 0340 12/04/16 0403  NA 139 136  K 4.1 4.2  CL 104 101  CO2 30 31  GLUCOSE 79 92  BUN 12 13  CREATININE 0.62 0.68  CALCIUM 8.1* 8.0*    Recent Labs  12/02/16 1004  AST 17  ALT 15*  ALKPHOS 79  BILITOT 0.9  PROT 6.2*  ALBUMIN 3.0*    Recent Labs  12/02/16 1004 12/03/16 0340  WBC 6.9 7.5  HGB 10.8* 10.7*  HCT 31.6* 31.0*  MCV 91.2 90.7  PLT 256 249    Recent Labs  12/02/16 1004 12/02/16 1544 12/02/16 2158  TROPONINI <0.03 <0.03 <0.03   Invalid input(s): POCBNP No results for input(s): HGBA1C in the last 72 hours.   Weights: Filed Weights   12/02/16 1430 12/03/16 0351 12/04/16 0355  Weight: 53 kg (116 lb 14.4 oz)  53.4 kg (117 lb 11.2 oz) 52.3 kg (115 lb 3.2 oz)     Radiology/Studies:  Dg Chest Port 1 View  Result Date: 12/03/2016 CLINICAL DATA:  S/p right thoracentesis. EXAM: PORTABLE CHEST - 1 VIEW COMPARISON:  the previous day's study FINDINGS: No pneumothorax. Interval decrease in right pleural effusion post thoracentesis with small residual. Improved aeration at the right lung base with some residual patchy airspace infiltrate or atelectasis laterally. Coarse interstitial opacities in the right lower lobe persist. Left lung clear. Heart size and mediastinal contours are within normal limits. Previous median  sternotomy and CABG. IMPRESSION: 1. No pneumothorax post right thoracentesis with small residual effusion. 2. Improved aeration in the right lower lobe with some residual interstitial and airspace disease as above. Electronically Signed   By: Lucrezia Europe M.D.   On: 12/03/2016 11:48   Dg Chest Portable 1 View  Result Date: 12/02/2016 CLINICAL DATA:  Shortness of breath. EXAM: PORTABLE CHEST 1 VIEW COMPARISON:  03/18/2016 FINDINGS: Normal heart size. Previous median sternotomy and CABG procedure. There is a moderate right pleural effusion with overlying atelectasis. No interstitial edema. IMPRESSION: 1. Moderate right pleural effusion with overlying atelectasis. Electronically Signed   By: Kerby Moors M.D.   On: 12/02/2016 09:35   US Thoracentesis Asp Pleural Space W/img Guide  Result Date: 12/03/2016 INDICATION: Shortness of breath common new right pleural effusion. EXAM: ULTRASOUND GUIDED RIGHT THORACENTESIS MEDICATIONS: Lidocaine 1% subcutaneous COMPLICATIONS: None immediate. PROCEDURE: An ultrasound guided thoracentesis was thoroughly discussed with the patient and questions answered. The benefits, risks, alternatives and complications were also discussed. The patient understands and wishes to proceed with the procedure. Written consent was obtained. Ultrasound was performed to localize and mark an adequate pocket of fluid in the right chest. The area was then prepped and draped in the normal sterile fashion. 1% Lidocaine was used for local anesthesia. Under ultrasound guidance a Safe-T-Centesis catheter was introduced. Thoracentesis was performed. The catheter was removed and a dressing applied. FINDINGS: A total of approximately 900 mL of clear yellow fluid was removed. Samples were sent to the laboratory as requested by the clinical team. IMPRESSION: Successful ultrasound guided right thoracentesis yielding 900 mL of pleural fluid. Electronically Signed   By: Lucrezia Europe M.D.   On: 12/03/2016 11:26      Assessment and Recommendation  83 y.o. male with known coronary artery disease status coronary artery bypass graft and aortic valve replacement on appropriate medication management for further risk reduction having palpitations irregular heart beat previously assessed by Holter monitor to be benign preventricular contractions and short runs of SVT now with pleural effusion and weakness without evidence of myocardial infarction 1. Consider addition of Lasix for further treatment of the chronic diastolic dysfunction heart failure and pleural effusion 2. Echocardiogram for LV systolic dysfunction valvular heart disease changes consistent with symptoms listed above now as outpatient with further adjustments as necessary 3. Metoprolol for short runs of supraventricular tachycardia and preventricular contractions but no increase in dosage at this time due to concerns of hypotension and bradycardia 4. High intensity cholesterol therapy with atorvastatin for coronary artery disease 5. Begin ambulation and follow for improvements of symptoms and possible discharge to home with follow-up in one to 2 weeks  Signed, Serafina Royals M.D. FACC

## 2016-12-05 LAB — CYTOLOGY - NON PAP

## 2016-12-06 LAB — BODY FLUID CULTURE: CULTURE: NO GROWTH

## 2016-12-08 LAB — PH, BODY FLUID: pH, Body Fluid: 7.6

## 2016-12-17 DIAGNOSIS — R918 Other nonspecific abnormal finding of lung field: Secondary | ICD-10-CM | POA: Diagnosis not present

## 2016-12-17 DIAGNOSIS — R0609 Other forms of dyspnea: Secondary | ICD-10-CM | POA: Diagnosis not present

## 2016-12-17 DIAGNOSIS — J90 Pleural effusion, not elsewhere classified: Secondary | ICD-10-CM | POA: Diagnosis not present

## 2016-12-17 DIAGNOSIS — I25810 Atherosclerosis of coronary artery bypass graft(s) without angina pectoris: Secondary | ICD-10-CM | POA: Diagnosis not present

## 2016-12-17 DIAGNOSIS — I471 Supraventricular tachycardia: Secondary | ICD-10-CM | POA: Diagnosis not present

## 2016-12-18 ENCOUNTER — Emergency Department: Payer: PPO

## 2016-12-18 ENCOUNTER — Inpatient Hospital Stay
Admission: EM | Admit: 2016-12-18 | Discharge: 2016-12-20 | DRG: 193 | Disposition: A | Discharge status: Home / Self Care | Payer: PPO | Attending: Internal Medicine | Admitting: Internal Medicine

## 2016-12-18 DIAGNOSIS — Z7902 Long term (current) use of antithrombotics/antiplatelets: Secondary | ICD-10-CM | POA: Diagnosis not present

## 2016-12-18 DIAGNOSIS — Z8546 Personal history of malignant neoplasm of prostate: Secondary | ICD-10-CM

## 2016-12-18 DIAGNOSIS — E7800 Pure hypercholesterolemia, unspecified: Secondary | ICD-10-CM | POA: Diagnosis not present

## 2016-12-18 DIAGNOSIS — I493 Ventricular premature depolarization: Secondary | ICD-10-CM | POA: Diagnosis not present

## 2016-12-18 DIAGNOSIS — Z8249 Family history of ischemic heart disease and other diseases of the circulatory system: Secondary | ICD-10-CM | POA: Diagnosis not present

## 2016-12-18 DIAGNOSIS — I471 Supraventricular tachycardia: Secondary | ICD-10-CM | POA: Diagnosis not present

## 2016-12-18 DIAGNOSIS — Z952 Presence of prosthetic heart valve: Secondary | ICD-10-CM

## 2016-12-18 DIAGNOSIS — I2510 Atherosclerotic heart disease of native coronary artery without angina pectoris: Secondary | ICD-10-CM | POA: Diagnosis present

## 2016-12-18 DIAGNOSIS — E43 Unspecified severe protein-calorie malnutrition: Secondary | ICD-10-CM | POA: Diagnosis not present

## 2016-12-18 DIAGNOSIS — R05 Cough: Secondary | ICD-10-CM | POA: Diagnosis present

## 2016-12-18 DIAGNOSIS — J189 Pneumonia, unspecified organism: Secondary | ICD-10-CM | POA: Diagnosis not present

## 2016-12-18 DIAGNOSIS — E785 Hyperlipidemia, unspecified: Secondary | ICD-10-CM | POA: Diagnosis present

## 2016-12-18 DIAGNOSIS — Z87891 Personal history of nicotine dependence: Secondary | ICD-10-CM | POA: Diagnosis not present

## 2016-12-18 DIAGNOSIS — G629 Polyneuropathy, unspecified: Secondary | ICD-10-CM | POA: Diagnosis not present

## 2016-12-18 DIAGNOSIS — Z9079 Acquired absence of other genital organ(s): Secondary | ICD-10-CM

## 2016-12-18 DIAGNOSIS — Z809 Family history of malignant neoplasm, unspecified: Secondary | ICD-10-CM

## 2016-12-18 DIAGNOSIS — Z681 Body mass index (BMI) 19 or less, adult: Secondary | ICD-10-CM

## 2016-12-18 DIAGNOSIS — I5032 Chronic diastolic (congestive) heart failure: Secondary | ICD-10-CM | POA: Diagnosis not present

## 2016-12-18 DIAGNOSIS — Z951 Presence of aortocoronary bypass graft: Secondary | ICD-10-CM

## 2016-12-18 DIAGNOSIS — I10 Essential (primary) hypertension: Secondary | ICD-10-CM | POA: Diagnosis not present

## 2016-12-18 DIAGNOSIS — J90 Pleural effusion, not elsewhere classified: Secondary | ICD-10-CM | POA: Diagnosis not present

## 2016-12-18 DIAGNOSIS — R0602 Shortness of breath: Secondary | ICD-10-CM | POA: Diagnosis not present

## 2016-12-18 DIAGNOSIS — J181 Lobar pneumonia, unspecified organism: Secondary | ICD-10-CM

## 2016-12-18 DIAGNOSIS — Z8673 Personal history of transient ischemic attack (TIA), and cerebral infarction without residual deficits: Secondary | ICD-10-CM | POA: Diagnosis not present

## 2016-12-18 HISTORY — DX: Heart failure, unspecified: I50.9

## 2016-12-18 LAB — COMPREHENSIVE METABOLIC PANEL
ALT: 13 U/L — ABNORMAL LOW (ref 17–63)
ANION GAP: 6 (ref 5–15)
AST: 20 U/L (ref 15–41)
Albumin: 2.7 g/dL — ABNORMAL LOW (ref 3.5–5.0)
Alkaline Phosphatase: 74 U/L (ref 38–126)
BUN: 14 mg/dL (ref 6–20)
CHLORIDE: 103 mmol/L (ref 101–111)
CO2: 29 mmol/L (ref 22–32)
Calcium: 8.2 mg/dL — ABNORMAL LOW (ref 8.9–10.3)
Creatinine, Ser: 0.78 mg/dL (ref 0.61–1.24)
GFR calc non Af Amer: >60 mL/min (ref >60)
Glucose, Bld: 93 mg/dL (ref 65–99)
POTASSIUM: 4 mmol/L (ref 3.5–5.1)
SODIUM: 138 mmol/L (ref 135–145)
Total Bilirubin: 0.7 mg/dL (ref 0.3–1.2)
Total Protein: 6.3 g/dL — ABNORMAL LOW (ref 6.5–8.1)

## 2016-12-18 LAB — CBC
HEMATOCRIT: 30.4 % — AB (ref 40.0–52.0)
Hemoglobin: 10.5 g/dL — ABNORMAL LOW (ref 13.0–18.0)
MCH: 30.9 pg (ref 26.0–34.0)
MCHC: 34.5 g/dL (ref 32.0–36.0)
MCV: 89.5 fL (ref 80.0–100.0)
PLATELETS: 259 10*3/uL (ref 150–440)
RBC: 3.4 MIL/uL — ABNORMAL LOW (ref 4.40–5.90)
RDW: 13.2 % (ref 11.5–14.5)
WBC: 9.8 10*3/uL (ref 3.8–10.6)

## 2016-12-18 LAB — TROPONIN I: Troponin I: <0.03 ng/mL (ref <0.03)

## 2016-12-18 LAB — PROTIME-INR
INR: 1.3
PROTHROMBIN TIME: 16.1 s — AB (ref 11.4–15.2)

## 2016-12-18 MED ORDER — CEFTRIAXONE SODIUM IN DEXTROSE 20 MG/ML IV SOLN
1.0000 g | Freq: Once | INTRAVENOUS | Status: AC
  Administered 2016-12-18: 1 g via INTRAVENOUS
  Filled 2016-12-18: qty 50

## 2016-12-18 MED ORDER — FUROSEMIDE 20 MG PO TABS
20.0000 mg | ORAL_TABLET | Freq: Every day | ORAL | Status: DC
  Administered 2016-12-18 – 2016-12-20 (×2): 20 mg via ORAL
  Filled 2016-12-18 (×2): qty 1

## 2016-12-18 MED ORDER — ATORVASTATIN CALCIUM 20 MG PO TABS
40.0000 mg | ORAL_TABLET | Freq: Every day | ORAL | Status: DC
  Administered 2016-12-18 – 2016-12-19 (×2): 40 mg via ORAL
  Filled 2016-12-18 (×2): qty 2

## 2016-12-18 MED ORDER — INFLUENZA VAC SPLIT HIGH-DOSE 0.5 ML IM SUSY
0.5000 mL | PREFILLED_SYRINGE | INTRAMUSCULAR | Status: DC
  Filled 2016-12-18: qty 0.5

## 2016-12-18 MED ORDER — POTASSIUM CHLORIDE ER 10 MEQ PO TBCR
10.0000 meq | EXTENDED_RELEASE_TABLET | Freq: Every day | ORAL | Status: DC
  Administered 2016-12-18 – 2016-12-20 (×3): 10 meq via ORAL
  Filled 2016-12-18 (×6): qty 1

## 2016-12-18 MED ORDER — CLOPIDOGREL BISULFATE 75 MG PO TABS
75.0000 mg | ORAL_TABLET | Freq: Every day | ORAL | Status: DC
  Administered 2016-12-18 – 2016-12-20 (×3): 75 mg via ORAL
  Filled 2016-12-18 (×4): qty 1

## 2016-12-18 MED ORDER — IPRATROPIUM-ALBUTEROL 0.5-2.5 (3) MG/3ML IN SOLN
3.0000 mL | Freq: Four times a day (QID) | RESPIRATORY_TRACT | Status: DC
Start: 2016-12-18 — End: 2016-12-19
  Administered 2016-12-18 – 2016-12-19 (×3): 3 mL via RESPIRATORY_TRACT
  Filled 2016-12-18 (×4): qty 3

## 2016-12-18 MED ORDER — CEFTRIAXONE SODIUM IN DEXTROSE 20 MG/ML IV SOLN: 1.0000 g | INTRAVENOUS | Status: DC

## 2016-12-18 MED ORDER — GABAPENTIN 300 MG PO CAPS
300.0000 mg | ORAL_CAPSULE | Freq: Two times a day (BID) | ORAL | Status: DC
  Administered 2016-12-18 – 2016-12-20 (×5): 300 mg via ORAL
  Filled 2016-12-18 (×5): qty 1

## 2016-12-18 MED ORDER — HEPARIN SODIUM (PORCINE) 5000 UNIT/ML IJ SOLN
5000.0000 [IU] | Freq: Three times a day (TID) | INTRAMUSCULAR | Status: DC
  Administered 2016-12-18 – 2016-12-19 (×5): 5000 [IU] via SUBCUTANEOUS
  Filled 2016-12-18 (×5): qty 1

## 2016-12-18 MED ORDER — IPRATROPIUM-ALBUTEROL 0.5-2.5 (3) MG/3ML IN SOLN
3.0000 mL | RESPIRATORY_TRACT | Status: DC
  Administered 2016-12-18 (×2): 3 mL via RESPIRATORY_TRACT
  Filled 2016-12-18 (×2): qty 3

## 2016-12-18 MED ORDER — CEFTRIAXONE SODIUM 1 G IJ SOLR
1.0000 g | INTRAMUSCULAR | Status: DC
  Administered 2016-12-19 – 2016-12-20 (×2): 1 g via INTRAVENOUS
  Filled 2016-12-18 (×2): qty 10

## 2016-12-18 MED ORDER — DEXTROSE 5 % IV SOLN
500.0000 mg | Freq: Once | INTRAVENOUS | Status: AC
  Administered 2016-12-18: 500 mg via INTRAVENOUS
  Filled 2016-12-18: qty 500

## 2016-12-18 MED ORDER — PANTOPRAZOLE SODIUM 40 MG PO TBEC
40.0000 mg | DELAYED_RELEASE_TABLET | Freq: Every day | ORAL | Status: DC
  Administered 2016-12-18 – 2016-12-20 (×3): 40 mg via ORAL
  Filled 2016-12-18 (×3): qty 1

## 2016-12-18 MED ORDER — ADULT MULTIVITAMIN W/MINERALS CH
1.0000 | ORAL_TABLET | Freq: Every day | ORAL | Status: DC
  Administered 2016-12-18 – 2016-12-20 (×3): 1 via ORAL
  Filled 2016-12-18 (×3): qty 1

## 2016-12-18 MED ORDER — DEXTROSE 5 % IV SOLN
250.0000 mg | INTRAVENOUS | Status: DC
  Administered 2016-12-19: 250 mg via INTRAVENOUS
  Filled 2016-12-18 (×2): qty 250

## 2016-12-18 MED ORDER — DOCUSATE SODIUM 100 MG PO CAPS: 100.0000 mg | ORAL_CAPSULE | Freq: Two times a day (BID) | ORAL | Status: DC | PRN

## 2016-12-18 MED ORDER — METOPROLOL TARTRATE 25 MG PO TABS
25.0000 mg | ORAL_TABLET | Freq: Two times a day (BID) | ORAL | Status: DC
  Administered 2016-12-18 (×2): 25 mg via ORAL
  Filled 2016-12-18 (×2): qty 1

## 2016-12-18 NOTE — Progress Notes (Signed)
Family Meeting Note  Advance Directive:yes  Today a meeting took place with the Patient and sister.  The following clinical team members were present during this meeting:MD  The following were discussed:Patient's diagnosis: pneumonia, pleural effusion, hx of CAD , Patient's progosis: Unable to determine and Goals for treatment: Full Code  Additional follow-up to be provided: treatment of pneumonia.  Time spent during discussion:20 minutes  Hansford Hirt, Rosalio Macadamia, MD

## 2016-12-18 NOTE — ED Provider Notes (Signed)
[facility] Emergency Department Provider Note   First MD Initiated Contact with Patient 12/18/16 0411     (approximate)  I have reviewed the triage vital signs and the nursing notes.   HISTORY  Chief Complaint Atrial Fibrillation    HPI Juan Mayo is a 83 y.o. male with below list ofchronic medical conditions presents to the emergency department with three-day history of nonproductive cough, congestionand dyspnea. Patient stated he awoke this morning approximately 3:30 AM with heart palpitations and chest discomfort for abdominal and right lower chest. Patient denies any fever or febrile on presentation. Patient denies any extremity pain or swelling   Past Medical History:  Diagnosis Date  . CAD (coronary artery disease)   . Hx of CABG   . Hypercholesteremia   . Neuropathy   . Prostate cancer ([place])   . Stroke ([place])   . SVT (supraventricular tachycardia) [facility])     Patient Active Problem List   Diagnosis Date Noted  . Protein-calorie malnutrition, severe 12/04/2016  . Pleural effusion 12/02/2016  . CVA (cerebral vascular accident) ([place]) 02/23/2016  . Hip fracture ([place]) 03/18/2015    Past Surgical History:  Procedure Laterality Date  . AORTIC VALVE REPLACEMENT    . BACK SURGERY    . CORONARY ARTERY BYPASS GRAFT    . FRACTURE SURGERY Right   . INTRAMEDULLARY (IM) NAIL INTERTROCHANTERIC Right 03/18/2015   Procedure: INTRAMEDULLARY (IM) NAIL INTERTROCHANTRIC;  Surgeon: [name], MD;  Location: [facility] ORS;  Service: Orthopedics;  Laterality: Right;  . PROSTATECTOMY    . REPLACEMENT TOTAL KNEE BILATERAL      Prior to Admission medications   Medication Sig Start Date End Date Taking? Authorizing Provider  atorvastatin (LIPITOR) 40 MG tablet Take 1 tablet (40 mg total) by mouth daily at 6 PM. Patient taking differently: Take 40 mg by mouth at bedtime.  02/25/16  Yes Epifanio Lesches, MD  clopidogrel (PLAVIX) 75 MG tablet Take 1 tablet (75  mg total) by mouth daily. 02/26/16  Yes Epifanio Lesches, MD  furosemide (LASIX) 20 MG tablet Take 1 tablet (20 mg total) by mouth daily. 12/04/16  Yes Henreitta Leber, MD  gabapentin (NEURONTIN) 600 MG tablet Take 300 mg by mouth 2 (two) times daily.    Yes [provider]  metoprolol tartrate (LOPRESSOR) 25 MG tablet Take 25 mg by mouth 2 (two) times daily.   Yes [provider]  Multiple Vitamins-Minerals (CENTRUM SILVER) tablet Take 1 tablet by mouth daily.   Yes [provider]  pantoprazole (PROTONIX) 20 MG tablet Take 20 mg by mouth daily.   Yes [provider]  potassium chloride (K-DUR) 10 MEQ tablet Take 1 tablet (10 mEq total) by mouth daily. 12/04/16  Yes Henreitta Leber, MD    Allergies no known drug allergies  Family History  Problem Relation Age of Onset  . Hypertension Other   . Parkinson's disease Mother   . Alzheimer's disease Father   . Cancer Sister   . Heart disease Brother   . Heart disease Brother   . Heart disease Brother     Social History Social History  Substance Use Topics  . Smoking status: Former Smoker    Types: Cigarettes  . Smokeless tobacco: Never Used  . Alcohol use No    Review of Systems Constitutional: No fever/chills Eyes: No visual changes. ENT: No sore throat. Cardiovascular: positive for chest pain. Respiratory: positive shortness of breath. Gastrointestinal: No abdominal pain.  No nausea, no  vomiting.  No diarrhea.  No constipation. Genitourinary: Negative for dysuria. Musculoskeletal: Negative for neck pain.  Negative for back pain. Integumentary: Negative for rash. Neurological: Negative for headaches, focal weakness or numbness.   ____________________________________________   PHYSICAL EXAM:  VITAL SIGNS: ED Triage Vitals  Enc Vitals Group     BP 12/18/16 0415 129/72     Pulse Rate 12/18/16 0415 98     Resp 12/18/16 0415 18     Temp 12/18/16 0415 98.3 F (36.8 C)     Temp  Source 12/18/16 0415 Oral     SpO2 12/18/16 0415 100 %     Weight 12/18/16 0418 52.2 kg (115 lb)     Height 12/18/16 0418 1.727 m (5\' 8" )     Head Circumference --      Peak Flow --      Pain Score 12/18/16 0415 0     Pain Loc --      Pain Edu? --      Excl. in [place]? --     Constitutional: Alert and oriented. Well appearing and in no acute distress. Eyes: Conjunctivae are normal.  Head: Atraumatic. Mouth/Throat: Mucous membranes are moist.  Oropharynx non-erythematous. Neck: No stridor.   Cardiovascular: Normal rate, regular rhythm. Good peripheral circulation. Grossly normal heart sounds. Respiratory: Normal respiratory effort.  No retractions. right lower lobe rhonchi Gastrointestinal: Soft and nontender. No distention.  Musculoskeletal: No lower extremity tenderness nor edema. No gross deformities of extremities. Neurologic:  Normal speech and language. No gross focal neurologic deficits are appreciated.  Skin:  Skin is warm, dry and intact. No rash noted. Psychiatric: Mood and affect are normal. Speech and behavior are normal.  ____________________________________________   LABS (all labs ordered are listed, but only abnormal results are displayed)  Labs Reviewed  CBC - Abnormal; Notable for the following:       Result Value   RBC 3.40 (*)    Hemoglobin 10.5 (*)    HCT 30.4 (*)    All other components within normal limits  PROTIME-INR - Abnormal; Notable for the following:    Prothrombin Time 16.1 (*)    All other components within normal limits  COMPREHENSIVE METABOLIC PANEL - Abnormal; Notable for the following:    Calcium 8.2 (*)    Total Protein 6.3 (*)    Albumin 2.7 (*)    ALT 13 (*)    All other components within normal limits  TROPONIN I   ____________________________________________  EKG  ED ECG REPORT I, Wanship N Brainard Highfill, the attending physician, personally viewed and interpreted this ECG.   Date: 12/18/2016  EKG Time: 4:17 AM  Rate: 95  Rhythm:  normal sinus rhythm with PVC  Axis: normal  Intervals:normal  ST&T Change: none  ____________________________________________  RADIOLOGY I, Boyds N Adit Riddles, personally viewed and evaluated these images (plain radiographs) as part of my medical decision making, as well as reviewing the written report by the radiologist.  Dg Chest 2 View  Result Date: 12/18/2016 CLINICAL DATA:  Heart palpitations and shortness of breath beginning about 3 o'clock this morning. EXAM: CHEST  2 VIEW COMPARISON:  12/03/2016 FINDINGS: Postoperative changes in the mediastinum. Normal heart size and pulmonary vascularity. Emphysematous changes in the lungs. Small right pleural effusion is slightly larger than on previous study. There is interval development of consolidation in the right lung base. This could indicate pneumonia. No pneumothorax. Calcification in the mitral valve annulus and in the aorta. IMPRESSION: Mild increase of right pleural effusion. Developing  consolidation in the right lung base could indicate pneumonia. Emphysema and aortic atherosclerosis. Electronically Signed   By: Lucienne Capers M.D.   On: 12/18/2016 04:52    Procedures   ____________________________________________   INITIAL IMPRESSION / ASSESSMENT AND PLAN / ED COURSE  Pertinent labs & imaging results that were available during my care of the patient were reviewed by me and considered in my medical decision making (see chart for details).   83 year old male presenting with above stated history of physical exam concern for possible cardiac etiology versus pulmonary. Chest x-ray consistent with right lower lobe pneumonia as per radiology report and a such patient given ceftriaxone 1 g IV and azithromycin 500 mg IV. Regarding potential cardiac etiology troponin negative 1 EKG revealed no evidence of ischemia or infarction.patient discussed with Dr. Marcille Blanco for hospital admission further evaluation and management.      ____________________________________________  FINAL CLINICAL IMPRESSION(S) / ED DIAGNOSES  Final diagnoses:  Community acquired pneumonia of right lower lobe of lung ([place])     MEDICATIONS GIVEN DURING THIS VISIT:  Medications  azithromycin (ZITHROMAX) 500 mg in dextrose 5 % 250 mL IVPB (not administered)  cefTRIAXone (ROCEPHIN) 1 g in dextrose 5 % 50 mL IVPB - Premix (1 g Intravenous New Bag/Given 12/18/16 0545)     NEW OUTPATIENT MEDICATIONS STARTED DURING THIS VISIT:  New Prescriptions   No medications on file    Modified Medications   No medications on file    Discontinued Medications   No medications on file     Note:  This document was prepared using Dragon voice recognition software and may include unintentional dictation errors.    Gregor Hams, MD 12/18/16 [phone]

## 2016-12-18 NOTE — H&P (Signed)
[facility] NAME: Juan Mayo    MR#:  [id number]  DATE OF BIRTH:  29-Aug-1921  DATE OF ADMISSION:  12/18/2016  PRIMARY CARE PHYSICIAN: Leonel Ramsay, MD   REQUESTING/REFERRING PHYSICIAN: Owens Shark  CHIEF COMPLAINT:   Chief Complaint  Patient presents with  . Atrial Fibrillation    HISTORY OF PRESENT ILLNESS: Juan Mayo  is a 83 y.o. male with a known history of CAD, CHF, CABG, HLD, Prostate cancer, Stroke, former smoker- was here with right sided pleural effusion 2 weeks ago, noted to have transudative fluid after tap. Felt better.  Last 2-3 days again feeling SOB with minimal activities, Cough with sputum production. No fever or chest pain. Noted to have RLL pneumonia on xray in ER.  PAST MEDICAL HISTORY:   Past Medical History:  Diagnosis Date  . CAD (coronary artery disease)   . CHF (congestive heart failure) ([place])   . Hx of CABG   . Hypercholesteremia   . Neuropathy   . Prostate cancer ([place])   . Stroke ([place])   . SVT (supraventricular tachycardia) ([place])     PAST SURGICAL HISTORY: Past Surgical History:  Procedure Laterality Date  . AORTIC VALVE REPLACEMENT    . BACK SURGERY    . CORONARY ARTERY BYPASS GRAFT    . FRACTURE SURGERY Right   . INTRAMEDULLARY (IM) NAIL INTERTROCHANTERIC Right 03/18/2015   Procedure: INTRAMEDULLARY (IM) NAIL INTERTROCHANTRIC;  Surgeon: [name], MD;  Location: [facility] ORS;  Service: Orthopedics;  Laterality: Right;  . PROSTATECTOMY    . REPLACEMENT TOTAL KNEE BILATERAL      SOCIAL HISTORY:  Social History  Substance Use Topics  . Smoking status: Former Smoker    Types: Cigarettes  . Smokeless tobacco: Never Used  . Alcohol use No    FAMILY HISTORY:  Family History  Problem Relation Age of Onset  . Hypertension Other   . Parkinson's disease Mother   . Alzheimer's disease Father   . Cancer Sister   . Heart disease Brother   . Heart disease Brother   . Heart disease Brother      DRUG ALLERGIES: No Known Allergies  REVIEW OF SYSTEMS:   CONSTITUTIONAL: No fever, fatigue or weakness.  EYES: No blurred or double vision.  EARS, NOSE, AND THROAT: No tinnitus or ear pain.  RESPIRATORY: positive for cough, shortness of breath, no wheezing or hemoptysis.  CARDIOVASCULAR: No chest pain, orthopnea, edema.  GASTROINTESTINAL: No nausea, vomiting, diarrhea or abdominal pain.  GENITOURINARY: No dysuria, hematuria.  ENDOCRINE: No polyuria, nocturia,  HEMATOLOGY: No anemia, easy bruising or bleeding SKIN: No rash or lesion. MUSCULOSKELETAL: No joint pain or arthritis.   NEUROLOGIC: No tingling, numbness, weakness.  PSYCHIATRY: No anxiety or depression.   MEDICATIONS AT HOME:  Prior to Admission medications   Medication Sig Start Date End Date Taking? Authorizing Provider  atorvastatin (LIPITOR) 40 MG tablet Take 1 tablet (40 mg total) by mouth daily at 6 PM. Patient taking differently: Take 40 mg by mouth at bedtime.  02/25/16  Yes Epifanio Lesches, MD  clopidogrel (PLAVIX) 75 MG tablet Take 1 tablet (75 mg total) by mouth daily. 02/26/16  Yes Epifanio Lesches, MD  furosemide (LASIX) 20 MG tablet Take 1 tablet (20 mg total) by mouth daily. 12/04/16  Yes Henreitta Leber, MD  gabapentin (NEURONTIN) 600 MG tablet Take 300 mg by mouth 2 (two) times daily.    Yes [provider]  metoprolol tartrate (LOPRESSOR) 25  MG tablet Take 25 mg by mouth 2 (two) times daily.   Yes [provider]  Multiple Vitamins-Minerals (CENTRUM SILVER) tablet Take 1 tablet by mouth daily.   Yes [provider]  pantoprazole (PROTONIX) 20 MG tablet Take 20 mg by mouth daily.   Yes [provider]  potassium chloride (K-DUR) 10 MEQ tablet Take 1 tablet (10 mEq total) by mouth daily. 12/04/16  Yes Henreitta Leber, MD      PHYSICAL EXAMINATION:   VITAL SIGNS: Blood pressure 112/77, pulse 93, temperature 98.3 F (36.8 C), temperature source Oral, resp. rate  19, height 5\' 8"  (1.727 m), weight 52.2 kg (115 lb), SpO2 100 %.  GENERAL:  83 y.o.-year-old patient lying in the bed with no acute distress.  EYES: Pupils equal, round, reactive to light and accommodation. No scleral icterus. Extraocular muscles intact.  HEENT: Head atraumatic, normocephalic. Oropharynx and nasopharynx clear.  NECK:  Supple, no jugular venous distention. No thyroid enlargement, no tenderness.  LUNGS: Normal breath sounds bilaterally, no wheezing, have crepitation. No use of accessory muscles of respiration.  CARDIOVASCULAR: S1, S2 normal. No murmurs, rubs, or gallops.  ABDOMEN: Soft, nontender, nondistended. Bowel sounds present. No organomegaly or mass.  EXTREMITIES: No pedal edema, cyanosis, or clubbing.  NEUROLOGIC: Cranial nerves II through XII are intact. Muscle strength 5/5 in all extremities. Sensation intact. Gait not checked.  PSYCHIATRIC: The patient is alert and oriented x 3.  SKIN: No obvious rash, lesion, or ulcer.   LABORATORY PANEL:   CBC  Recent Labs Lab 12/18/16 0418  WBC 9.8  HGB 10.5*  HCT 30.4*  PLT 259  MCV 89.5  MCH 30.9  MCHC 34.5  RDW 13.2   ------------------------------------------------------------------------------------------------------------------  Chemistries   Recent Labs Lab 12/18/16 0418  NA 138  K 4.0  CL 103  CO2 29  GLUCOSE 93  BUN 14  CREATININE 0.78  CALCIUM 8.2*  AST 20  ALT 13*  ALKPHOS 74  BILITOT 0.7   ------------------------------------------------------------------------------------------------------------------ estimated creatinine clearance is 42.6 mL/min (by C-G formula based on SCr of 0.78 mg/dL). ------------------------------------------------------------------------------------------------------------------ No results for input(s): TSH, T4TOTAL, T3FREE, THYROIDAB in the last 72 hours.  Invalid input(s): FREET3   Coagulation profile  Recent Labs Lab 12/18/16 0418  INR 1.30    ------------------------------------------------------------------------------------------------------------------- No results for input(s): DDIMER in the last 72 hours. -------------------------------------------------------------------------------------------------------------------  Cardiac Enzymes  Recent Labs Lab 12/18/16 0418  TROPONINI <0.03   ------------------------------------------------------------------------------------------------------------------ Invalid input(s): POCBNP  ---------------------------------------------------------------------------------------------------------------  Urinalysis    Component Value Date/Time   COLORURINE YELLOW (A) 02/24/2016 0605   APPEARANCEUR CLEAR (A) 02/24/2016 0605   LABSPEC 1.014 02/24/2016 0605   PHURINE 6.0 02/24/2016 0605   GLUCOSEU NEGATIVE 02/24/2016 0605   HGBUR NEGATIVE 02/24/2016 0605   BILIRUBINUR NEGATIVE 02/24/2016 0605   KETONESUR 2+ (A) 02/24/2016 0605   PROTEINUR NEGATIVE 02/24/2016 0605   NITRITE NEGATIVE 02/24/2016 0605   LEUKOCYTESUR NEGATIVE 02/24/2016 2951     RADIOLOGY: Dg Chest 2 View  Result Date: 12/18/2016 CLINICAL DATA:  Heart palpitations and shortness of breath beginning about 3 o'clock this morning. EXAM: CHEST  2 VIEW COMPARISON:  12/03/2016 FINDINGS: Postoperative changes in the mediastinum. Normal heart size and pulmonary vascularity. Emphysematous changes in the lungs. Small right pleural effusion is slightly larger than on previous study. There is interval development of consolidation in the right lung base. This could indicate pneumonia. No pneumothorax. Calcification in the mitral valve annulus and in the aorta. IMPRESSION: Mild increase of right pleural effusion. Developing consolidation  in the right lung base could indicate pneumonia. Emphysema and aortic atherosclerosis. Electronically Signed   By: Lucienne Capers M.D.   On: 12/18/2016 04:52    EKG: Orders placed or performed  during the hospital encounter of 12/18/16  . EKG 12-Lead  . EKG 12-Lead  . EKG 12-Lead  . EKG 12-Lead    IMPRESSION AND PLAN:  * Commu Acquired Pneumonia   RLL Pneumonia      IV rocephin+ Azithromycin    Incentive spirometer    Duoneb.    * Right pleural effusion   Was noted to be due to CHF last time.    Cont treatment fro PNA and lasix oral, and monitor.  * Ch diastolic CHF   No exacerbation this time.  * Hx of CAD    Plavix, metoprolol, statin  * HLD   Cont atorvastatin.  All the records are reviewed and case discussed with ED provider. Management plans discussed with the patient, family and they are in agreement.  CODE STATUS: Full. Code Status History    Date Active Date Inactive Code Status Order ID Comments User Context   12/02/2016  1:08 PM 12/04/2016  2:52 PM Full Code [id number]  Hillary Bow, MD ED   02/23/2016  3:56 PM 02/25/2016  7:56 PM Full Code [id number]  Demetrios Loll, MD Inpatient   03/18/2015  5:58 PM 03/21/2015  5:03 PM Full Code [id number]  [name], MD Inpatient   03/18/2015  8:20 AM 03/18/2015  5:58 PM Full Code [id number]  Harrie Foreman, MD Inpatient   03/18/2015  5:55 AM 03/18/2015  8:20 AM Full Code [id number]  [name], MD ED    Advance Directive Documentation     Most Recent Value  Type of Advance Directive  Healthcare Power of Attorney  Pre-existing out of facility DNR order (yellow form or pink MOST form)  -  "MOST" Form in Place?  -       TOTAL TIME TAKING CARE OF THIS PATIENT: 50 minutes.  Discussed with his sister in room.  Vaughan Basta M.D on 12/18/2016   Between 7am to 6pm - Pager - [phone]  After 6pm go to [email or web address] - password [email or web address] [facility]  Office  [phone]  CC: Primary care physician; Leonel Ramsay, MD   Note: This dictation was prepared with Dragon dictation along with smaller phrase technology. Any transcriptional errors that result from this process are  unintentional.

## 2016-12-18 NOTE — ED Triage Notes (Signed)
Pt arrives to ED via [facility] from home with c/o heart palpitations and SHOB that started around 3-3:30am this morning. EMS reports pt has a h/x of A-fib and "may or may not be taking his Metoprolol" as prescribed. Pt is A&O, and very adamanant that he takes his medications regularly and as prescribed, including his r/x'd Plavix for A-fib. Pt's RR is even, regular, and unlabored; skin color/temp is WNL. Dr Owens Shark at bedside upon pt's arrival to ED.

## 2016-12-19 ENCOUNTER — Inpatient Hospital Stay: Payer: PPO

## 2016-12-19 LAB — BASIC METABOLIC PANEL
ANION GAP: 6 (ref 5–15)
BUN: 11 mg/dL (ref 6–20)
CALCIUM: 7.9 mg/dL — AB (ref 8.9–10.3)
CO2: 31 mmol/L (ref 22–32)
Chloride: 99 mmol/L — ABNORMAL LOW (ref 101–111)
Creatinine, Ser: 0.75 mg/dL (ref 0.61–1.24)
GFR calc Af Amer: >60 mL/min (ref >60)
GLUCOSE: 97 mg/dL (ref 65–99)
POTASSIUM: 4.7 mmol/L (ref 3.5–5.1)
SODIUM: 136 mmol/L (ref 135–145)

## 2016-12-19 LAB — CBC
HEMATOCRIT: 29.5 % — AB (ref 40.0–52.0)
HEMOGLOBIN: 10 g/dL — AB (ref 13.0–18.0)
MCH: 30.6 pg (ref 26.0–34.0)
MCHC: 34 g/dL (ref 32.0–36.0)
MCV: 90.2 fL (ref 80.0–100.0)
Platelets: 251 10*3/uL (ref 150–440)
RBC: 3.28 MIL/uL — ABNORMAL LOW (ref 4.40–5.90)
RDW: 13.1 % (ref 11.5–14.5)
WBC: 9.8 10*3/uL (ref 3.8–10.6)

## 2016-12-19 MED ORDER — IPRATROPIUM-ALBUTEROL 0.5-2.5 (3) MG/3ML IN SOLN
3.0000 mL | Freq: Three times a day (TID) | RESPIRATORY_TRACT | Status: DC
  Administered 2016-12-20: 3 mL via RESPIRATORY_TRACT
  Filled 2016-12-19: qty 3

## 2016-12-19 MED ORDER — ENSURE ENLIVE PO LIQD
237.0000 mL | Freq: Two times a day (BID) | ORAL | Status: DC
  Administered 2016-12-19 – 2016-12-20 (×2): 237 mL via ORAL

## 2016-12-19 MED ORDER — METOPROLOL TARTRATE 25 MG PO TABS
12.5000 mg | ORAL_TABLET | Freq: Two times a day (BID) | ORAL | Status: DC
  Administered 2016-12-19 – 2016-12-20 (×2): 12.5 mg via ORAL
  Filled 2016-12-19 (×2): qty 1

## 2016-12-19 NOTE — Progress Notes (Addendum)
Initial Nutrition Assessment  DOCUMENTATION CODES:   Severe malnutrition in context of chronic illness  INTERVENTION:   Ensure Enlive po BID, each supplement provides 350 kcal and 20 grams of protein  Magic cup TID with meals, each supplement provides 290 kcal and 9 grams of protein  MVI  NUTRITION DIAGNOSIS:   Malnutrition (severe) related to chronic illness (heart disease ), advanced age as evidenced by severe depletion of body fat, severe depletion of muscle mass.  GOAL:   Patient will meet greater than or equal to 90% of their needs  MONITOR:   PO intake, Supplement acceptance, Labs, Weight trends, Skin, I & O's  REASON FOR ASSESSMENT:   Other (Comment) (Low BMI)    ASSESSMENT:   83 y.o. male with a known history of CAD, CHF, CABG, HLD, Prostate cancer, Stroke, former smoker- was here with right sided pleural effusion 2 weeks ago, noted to have transudative fluid after tap. Felt better. Last 2-3 days again feeling SOB with minimal activities, Cough with sputum production. No fever or chest pain. Found to have CAP   Met with pt in room today; RD familiar with this pt from previous admit. Pt reports good appetite and oral intake pta. Pt is normally a good eater in the hospital. Pt is currently eating 85% of his meals. Pt does drink Ensure and prefers chocolate. RD weighed pt in room today; pt wt weight gain since last admit. Pt with two new wounds on his buttocks and sacrum. RD encouraged intake of adequate protein. Pt requesting PT consult today; reports that after his last admit, his muscles were extremely weak upon discharge. RD spoke to RN about requesting PT consult. Pt reports that he has one leg longer than the other and he is waiting for someone to bring him a special shoe from home. RD will order supplements. Pt received heart healthy education on previous admit.    Medications reviewed and include: plavix, lasix, heparin, MVI, protonix, KCl, azithroycin,  ceftriaxone  Labs reviewed: Cl 99(L), Ca 7.9(L) adj. 8.94 wnl, alb 2.7(L)  Nutrition-Focused physical exam completed. Findings are severe fat and muscle depletions over entire body, and no edema.   Diet Order:  Diet regular Room service appropriate? Yes; Fluid consistency: Thin  Skin:  Wound (see comment) (Stage II buttocks, Stage I sacrum )  Last BM:  9/20  Height:   Ht Readings from Last 1 Encounters:  12/18/16 '5\' 8"'$  (1.727 m)    Weight:   Wt Readings from Last 1 Encounters:  12/19/16 122 lb 12.8 oz (55.7 kg)    Ideal Body Weight:  70 kg  BMI:  Body mass index is 18.67 kg/m.  Estimated Nutritional Needs:   Kcal:  1600-1900kcal/day  Protein:  85-95g/day   Fluid:  >1.6L/day   EDUCATION NEEDS:   Education needs addressed  Koleen Distance MS, RD, LDN Pager #[phone] After Hours Pager: [phone]

## 2016-12-19 NOTE — Care Management Important Message (Signed)
Important Message  Patient Details  Name: EMMIT ORILEY MRN: [id number] Date of Birth: 1921/05/15   Medicare Important Message Given:  Yes    Jolly Mango, RN 12/19/2016, 2:30 PM

## 2016-12-19 NOTE — Care Management Note (Signed)
Case Management Note  Patient Details  Name: FLEETWOOD PIERRON MRN: [id number] Date of Birth: 1922/03/23   Subjective/Objective: PT recommending home health  PT. Met with patient at bedside. He lives alone. He continues to drive and mow his grass. Very independent with adls. Discussed home health with patient and he declined it at this time. He stated he would call his PCP in the event he changed his mind. PCP Dr. Ola Spurr.                    Action/Plan: No further needs identified.  Expected Discharge Date:                  Expected Discharge Plan:  Home/Self Care  In-House Referral:     Discharge planning Services  CM Consult  Post Acute Care Choice:  Home Health Choice offered to:  Patient  DME Arranged:    DME Agency:     HH Arranged:  Patient Refused [place] Agency:     Status of Service:  Completed, signed off  If discussed at [employer] of Stay Meetings, dates discussed:    Additional Comments:  Jolly Mango, RN 12/19/2016, 2:25 PM

## 2016-12-19 NOTE — Progress Notes (Signed)
[facility] [place] NAME: Juan Mayo    MR#:  [id number]  DATE OF BIRTH:  Jul 06, 1921  SUBJECTIVE:  CHIEF COMPLAINT:   Chief Complaint  Patient presents with  . Atrial Fibrillation     Came with pneumonia, feels little better today. Still have Some SOB, but able to walk till bathroom.  REVIEW OF SYSTEMS:  CONSTITUTIONAL: No fever, fatigue or weakness.  EYES: No blurred or double vision.  EARS, NOSE, AND THROAT: No tinnitus or ear pain.  RESPIRATORY: positive for cough, shortness of breath, no wheezing or hemoptysis.  CARDIOVASCULAR: No chest pain, orthopnea, edema.  GASTROINTESTINAL: No nausea, vomiting, diarrhea or abdominal pain.  GENITOURINARY: No dysuria, hematuria.  ENDOCRINE: No polyuria, nocturia,  HEMATOLOGY: No anemia, easy bruising or bleeding SKIN: No rash or lesion. MUSCULOSKELETAL: No joint pain or arthritis.   NEUROLOGIC: No tingling, numbness, weakness.  PSYCHIATRY: No anxiety or depression.   ROS  DRUG ALLERGIES:  No Known Allergies  VITALS:  Blood pressure (!) 100/55, pulse 87, temperature 98 F (36.7 C), temperature source Oral, resp. rate 20, height 5\' 8"  (1.727 m), weight 55.7 kg (122 lb 12.8 oz), SpO2 93 %.  PHYSICAL EXAMINATION:  GENERAL:  83 y.o.-year-old patient lying in the bed with no acute distress.  EYES: Pupils equal, round, reactive to light and accommodation. No scleral icterus. Extraocular muscles intact.  HEENT: Head atraumatic, normocephalic. Oropharynx and nasopharynx clear.  NECK:  Supple, no jugular venous distention. No thyroid enlargement, no tenderness.  LUNGS: Normal breath sounds bilaterally, no wheezing, some crepitation. No use of accessory muscles of respiration.  CARDIOVASCULAR: S1, S2 normal. No murmurs, rubs, or gallops.  ABDOMEN: Soft, nontender, nondistended. Bowel sounds present. No organomegaly or mass.  EXTREMITIES: No pedal edema, cyanosis, or clubbing.  NEUROLOGIC: Cranial  nerves II through XII are intact. Muscle strength 4- 5/5 in all extremities. Sensation intact. Gait not checked.  PSYCHIATRIC: The patient is alert and oriented x 3.  SKIN: No obvious rash, lesion, or ulcer.   Physical Exam LABORATORY PANEL:   CBC  Recent Labs Lab 12/19/16 0442  WBC 9.8  HGB 10.0*  HCT 29.5*  PLT 251   ------------------------------------------------------------------------------------------------------------------  Chemistries   Recent Labs Lab 12/18/16 0418 12/19/16 0442  NA 138 136  K 4.0 4.7  CL 103 99*  CO2 29 31  GLUCOSE 93 97  BUN 14 11  CREATININE 0.78 0.75  CALCIUM 8.2* 7.9*  AST 20  --   ALT 13*  --   ALKPHOS 74  --   BILITOT 0.7  --    ------------------------------------------------------------------------------------------------------------------  Cardiac Enzymes  Recent Labs Lab 12/18/16 0418  TROPONINI <0.03   ------------------------------------------------------------------------------------------------------------------  RADIOLOGY:  Dg Chest 2 View  Result Date: 12/19/2016 CLINICAL DATA:  Pneumonia. EXAM: CHEST  2 VIEW COMPARISON:  December 18, 2016 FINDINGS: The infiltrate in the right middle lobe is similar in the interval. The associated effusion is slightly more prominent. No other changes. IMPRESSION: The right middle lobe infiltrate is similar in the interval. The small right-sided effusion however is mildly more prominent. Electronically Signed   By: Dorise Bullion III M.D   On: 12/19/2016 09:56   Dg Chest 2 View  Result Date: 12/18/2016 CLINICAL DATA:  Heart palpitations and shortness of breath beginning about 3 o'clock this morning. EXAM: CHEST  2 VIEW COMPARISON:  12/03/2016 FINDINGS: Postoperative changes in the mediastinum. Normal heart size and pulmonary vascularity. Emphysematous changes in the lungs. Small right pleural  effusion is slightly larger than on previous study. There is interval development of  consolidation in the right lung base. This could indicate pneumonia. No pneumothorax. Calcification in the mitral valve annulus and in the aorta. IMPRESSION: Mild increase of right pleural effusion. Developing consolidation in the right lung base could indicate pneumonia. Emphysema and aortic atherosclerosis. Electronically Signed   By: Lucienne Capers M.D.   On: 12/18/2016 04:52    ASSESSMENT AND PLAN:   Principal Problem:   Community acquired pneumonia Active Problems:   Pneumonia  * Commu Acquired Pneumonia   RLL Pneumonia      IV rocephin+ Azithromycin    Incentive spirometer    Duoneb.   Try to ambulate in hall and wean oxygen to room air.    * Right pleural effusion   Was noted to be due to CHF last time. transudative.    Cont treatment for PNA and lasix oral, and monitor.  * Ch diastolic CHF   No exacerbation this time.  * Hx of CAD    Plavix, metoprolol, statin  * HLD   Cont atorvastatin.    All the records are reviewed and case discussed with Care Management/Social Workerr. Management plans discussed with the patient, family and they are in agreement.  CODE STATUS: Full.  TOTAL TIME TAKING CARE OF THIS PATIENT: 35 minutes.     POSSIBLE D/C IN 1-2 DAYS, DEPENDING ON CLINICAL CONDITION.   Vaughan Basta M.D on 12/19/2016   Between 7am to 6pm - Pager - [phone]  After 6pm go to [email or web address] - password [email or web address] [facility]  Office  [phone]  CC: Primary care physician; Leonel Ramsay, MD  Note: This dictation was prepared with Dragon dictation along with smaller phrase technology. Any transcriptional errors that result from this process are unintentional.

## 2016-12-19 NOTE — Progress Notes (Signed)
Physical Therapy Treatment Patient Details Name: Juan Mayo MRN: [id number] DOB: 1921-04-26 Today's Date: 12/19/2016    History of Present Illness Pt is a 83 y/o M who presents with SOB and noted to have RLL pneumonia in ER.  Of note, pt was in hospital ~2 wks ago for pleural effusion.  Pt's PMH includes CHF, CABG, stroke, prostate cancer, SVT, back surgery, R IM nail, Bil TKA.      PT Comments    Pt admitted with above diagnosis. Pt currently with functional limitations due to the deficits listed below (see PT Problem List). Mr. Steenson is independent at baseline and ambulates without AD.  He has not had any falls in the past 6 months.  He demonstrates instability with challenges to his balance while ambulating and pt scored a 37/56 on the [employer], indicating pt is at an increased risk of falling.  Results explained to the pt and recommending HHPT at d/c. Pt will benefit from skilled PT to increase their independence and safety with mobility to allow discharge to the venue listed below.      Follow Up Recommendations  Home health PT     Equipment Recommendations  None recommended by PT    Recommendations for Other Services       Precautions / Restrictions Precautions Precautions: Fall Restrictions Weight Bearing Restrictions: No    Mobility  Bed Mobility Overal bed mobility: Independent Bed Mobility: Supine to Sit;Sit to Supine     Supine to sit: Independent Sit to supine: Independent   General bed mobility comments: Pt performs independently without physical assist or cues.   Transfers Overall transfer level: Needs assistance Equipment used: None Transfers: Sit to/from Stand Sit to Stand: Min guard         General transfer comment: Pt greatly favors using BUEs to push up to standing while bracing legs against side of the bed.  No LOB and no physical assist needed.  Ambulation/Gait Ambulation/Gait assistance: Supervision Ambulation Distance (Feet): 350  Feet Assistive device: None Gait Pattern/deviations: Step-through pattern;Decreased stride length;Trunk flexed   Gait velocity interpretation: at or above normal speed for age/gender General Gait Details: Steady gait with no signs of instability when ambulating without challenges to balance.  Mild instability noted with challenges to balance while ambulating.    Stairs            Wheelchair Mobility    Modified Rankin (Stroke Patients Only)       Balance Overall balance assessment: Needs assistance Sitting-balance support: No upper extremity supported;Feet supported Sitting balance-Leahy Scale: Good     Standing balance support: No upper extremity supported;During functional activity Standing balance-Leahy Scale: Fair Standing balance comment: Pt able to stand statically and ambulate without UE support but would likely lose his balance with perturbation.                  Standardized Balance Assessment Standardized Balance Assessment : Berg Balance Test;Dynamic Gait Index Berg Balance Test Sit to Stand: Able to stand  independently using hands Standing Unsupported: Able to stand safely 2 minutes Sitting with Back Unsupported but Feet Supported on Floor or Stool: Able to sit safely and securely 2 minutes Stand to Sit: Sits safely with minimal use of hands Transfers: Able to transfer safely, definite need of hands Standing Unsupported with Eyes Closed: Able to stand 10 seconds with supervision Standing Ubsupported with Feet Together: Able to place feet together independently and stand for 1 minute with supervision From Standing, Reach  Forward with Outstretched Arm: Can reach confidently >25 cm (10") From Standing Position, Pick up Object from Floor: Able to pick up shoe, needs supervision From Standing Position, Turn to Look Behind Over each Shoulder: Turn sideways only but maintains balance Turn 360 Degrees: Needs close supervision or verbal cueing Standing  Unsupported, Alternately Place Feet on Step/Stool: Able to complete 4 steps without aid or supervision Standing Unsupported, One Foot in Front: Loses balance while stepping or standing Standing on One Leg: Tries to lift leg/unable to hold 3 seconds but remains standing independently Total Score: 37 Dynamic Gait Index Level Surface: Normal Change in Gait Speed: Mild Impairment Gait with Horizontal Head Turns: Mild Impairment Gait with Vertical Head Turns: Mild Impairment Gait and Pivot Turn: Mild Impairment Step Over Obstacle: Mild Impairment Step Around Obstacles: Normal      Cognition Arousal/Alertness: Awake/alert Behavior During Therapy: WFL for tasks assessed/performed Overall Cognitive Status: Within Functional Limits for tasks assessed                                        Exercises Other Exercises Other Exercises: Encouraged pt to ambualte at least 3x/day with nursing staff.     General Comments General comments (skin integrity, edema, etc.): Pt scored a 37/56 on the Berg Balance Test, indicating pt is at an increased risk of falling.       Pertinent Vitals/Pain Pain Assessment: No/denies pain    Home Living Family/patient expects to be discharged to:: Private residence Living Arrangements: Alone Available Help at Discharge: Family;Available PRN/intermittently (sister visits pt every day) Type of Home: House Home Access: Stairs to enter Entrance Stairs-Rails: Left;Right;Can reach both Home Layout: One level (has a basement but pt does not go down there often) Home Equipment: Walker - 2 wheels;Cane - single point;Shower seat      Prior Function Level of Independence: Independent      Comments: Pt reports that he ambulates without AD typically but if he is ambulating outside up a hill or in unusal terrain then he will use his SPC.  Pt denies any falls in the past 6 months.  Pt independent with cooking, cleaning, bathing, dressing, driving.     PT  Goals (current goals can now be found in the care plan section) Acute Rehab PT Goals Patient Stated Goal: to go home PT Goal Formulation: With patient Time For Goal Achievement: 01/02/17 Potential to Achieve Goals: Good    Frequency    Min 2X/week      PT Plan      Co-evaluation              AM-PAC PT "6 Clicks" Daily Activity  Outcome Measure  Difficulty turning over in bed (including adjusting bedclothes, sheets and blankets)?: None Difficulty moving from lying on back to sitting on the side of the bed? : None Difficulty sitting down on and standing up from a chair with arms (e.g., wheelchair, bedside commode, etc,.)?: A Little Help needed moving to and from a bed to chair (including a wheelchair)?: A Little Help needed walking in hospital room?: A Little Help needed climbing 3-5 steps with a railing? : A Little 6 Click Score: 20    End of Session Equipment Utilized During Treatment: Gait belt Activity Tolerance: Patient tolerated treatment well Patient left: in bed;with call bell/phone within reach;with bed alarm set;with family/visitor present Nurse Communication: Mobility status PT Visit Diagnosis: Unsteadiness  on feet (R26.81);Muscle weakness (generalized) (M62.81)     Time: 1610-9604 PT Time Calculation (min) (ACUTE ONLY): 29 min  Charges:  $Gait Training: 8-22 mins                    G Codes:  Functional Assessment Tool Used: AM-PAC 6 Clicks Basic Mobility;Clinical judgement Functional Limitation: Mobility: Walking and moving around Mobility: Walking and Moving Around Current Status (V4098): At least 20 percent but less than 40 percent impaired, limited or restricted Mobility: Walking and Moving Around Goal Status [phone]): At least 1 percent but less than 20 percent impaired, limited or restricted    Collie Siad PT, DPT 12/19/2016, 3:02 PM

## 2016-12-19 NOTE — Progress Notes (Signed)
PT Cancellation Note  Patient Details Name: Juan Mayo MRN: [id number] DOB: 20-Jun-1921   Cancelled Treatment:    Reason Eval/Treat Not Completed: Patient declined, no reason specified (Pt eating lunch and requests that PT return later today.)  Will continue to follow acutely.   Collie Siad PT, DPT 12/19/2016, 1:25 PM

## 2016-12-20 MED ORDER — CEFUROXIME AXETIL 250 MG PO TABS: 250.0000 mg | ORAL_TABLET | Freq: Two times a day (BID) | ORAL | 0 refills | Status: AC

## 2016-12-20 MED ORDER — AZITHROMYCIN 250 MG PO TABS: 250.0000 mg | ORAL_TABLET | Freq: Every day | ORAL | 0 refills | Status: AC

## 2016-12-20 NOTE — Progress Notes (Signed)
Patient discharging home. Sister transporting home. IV removed. Belongings packed. Transported home via sister. Instructions and prescriptions given to patient, verbalized understanding.

## 2016-12-22 DIAGNOSIS — I493 Ventricular premature depolarization: Secondary | ICD-10-CM | POA: Diagnosis not present

## 2016-12-22 DIAGNOSIS — I350 Nonrheumatic aortic (valve) stenosis: Secondary | ICD-10-CM | POA: Diagnosis not present

## 2016-12-22 DIAGNOSIS — I340 Nonrheumatic mitral (valve) insufficiency: Secondary | ICD-10-CM | POA: Diagnosis not present

## 2016-12-22 DIAGNOSIS — E782 Mixed hyperlipidemia: Secondary | ICD-10-CM | POA: Diagnosis not present

## 2016-12-22 DIAGNOSIS — I471 Supraventricular tachycardia: Secondary | ICD-10-CM | POA: Diagnosis not present

## 2016-12-22 DIAGNOSIS — I63412 Cerebral infarction due to embolism of left middle cerebral artery: Secondary | ICD-10-CM | POA: Diagnosis not present

## 2016-12-22 DIAGNOSIS — R0689 Other abnormalities of breathing: Secondary | ICD-10-CM | POA: Diagnosis not present

## 2016-12-22 DIAGNOSIS — I071 Rheumatic tricuspid insufficiency: Secondary | ICD-10-CM | POA: Diagnosis not present

## 2016-12-22 DIAGNOSIS — J90 Pleural effusion, not elsewhere classified: Secondary | ICD-10-CM | POA: Diagnosis not present

## 2016-12-22 DIAGNOSIS — I25810 Atherosclerosis of coronary artery bypass graft(s) without angina pectoris: Secondary | ICD-10-CM | POA: Diagnosis not present

## 2016-12-22 DIAGNOSIS — R0600 Dyspnea, unspecified: Secondary | ICD-10-CM | POA: Diagnosis not present

## 2016-12-23 NOTE — Discharge Summary (Signed)
[facility] NAME: Juan Mayo    MR#:  [id number]  DATE OF BIRTH:  08-02-1921  DATE OF ADMISSION:  12/18/2016 ADMITTING PHYSICIAN: Vaughan Basta, MD  DATE OF DISCHARGE: 12/20/2016 11:15 AM  PRIMARY CARE PHYSICIAN: Leonel Ramsay, MD    ADMISSION DIAGNOSIS:  Community acquired pneumonia of right lower lobe of lung ([place]) [J18.1]  DISCHARGE DIAGNOSIS:  Principal Problem:   Community acquired pneumonia Active Problems:   Pneumonia   SECONDARY DIAGNOSIS:   Past Medical History:  Diagnosis Date  . CAD (coronary artery disease)   . CHF (congestive heart failure) ([place])   . Hx of CABG   . Hypercholesteremia   . Neuropathy   . Prostate cancer ([place])   . Stroke ([place])   . SVT (supraventricular tachycardia) ([place])     HOSPITAL COURSE:   * Commu Acquired Pneumonia RLL Pneumonia  IV rocephin+ Azithromycin Incentive spirometer Duoneb.   Try to ambulate in hall and wean oxygen to room air.   improved and felt better.  * Right pleural effusion Was noted to be due to CHF last time. transudative. Cont treatment for PNA and lasix oral, and monitor.  * Ch diastolic CHF No exacerbation this time.  * Hx of CAD Plavix, metoprolol, statin  * HLD Cont atorvastatin.   DISCHARGE CONDITIONS:   Stable.  CONSULTS OBTAINED:    DRUG ALLERGIES:  No Known Allergies  DISCHARGE MEDICATIONS:   Discharge Medication List as of 12/20/2016  9:39 AM    START taking these medications   Details  azithromycin (ZITHROMAX Z-PAK) 250 MG tablet Take 1 tablet (250 mg total) by mouth daily. Take 2 tablets (500 mg) on  Day 1,  followed by 1 tablet (250 mg) once daily on Days 2 through 5., Starting Sat 12/20/2016, Until Thu 12/25/2016, Print    cefUROXime (CEFTIN) 250 MG tablet Take 1 tablet (250 mg total) by mouth 2 (two) times daily., Starting Sat 12/20/2016, Until Thu 12/25/2016, Print       CONTINUE these medications which have NOT CHANGED   Details  atorvastatin (LIPITOR) 40 MG tablet Take 1 tablet (40 mg total) by mouth daily at 6 PM., Starting Mon 02/25/2016, Normal    clopidogrel (PLAVIX) 75 MG tablet Take 1 tablet (75 mg total) by mouth daily., Starting Tue 02/26/2016, Normal    furosemide (LASIX) 20 MG tablet Take 1 tablet (20 mg total) by mouth daily., Starting Thu 12/04/2016, Print    gabapentin (NEURONTIN) 600 MG tablet Take 300 mg by mouth 2 (two) times daily. , Historical Med    metoprolol tartrate (LOPRESSOR) 25 MG tablet Take 25 mg by mouth 2 (two) times daily., Historical Med    Multiple Vitamins-Minerals (CENTRUM SILVER) tablet Take 1 tablet by mouth daily., Historical Med    pantoprazole (PROTONIX) 20 MG tablet Take 20 mg by mouth daily., Historical Med    potassium chloride (K-DUR) 10 MEQ tablet Take 1 tablet (10 mEq total) by mouth daily., Starting Thu 12/04/2016, Print         DISCHARGE INSTRUCTIONS:    Follow with PMD in 1-2 weeks.  If you experience worsening of your admission symptoms, develop shortness of breath, life threatening emergency, suicidal or homicidal thoughts you must seek medical attention immediately by calling 911 or calling your MD immediately  if symptoms less severe.  You Must read complete instructions/literature along with all the possible adverse reactions/side effects for all the Medicines you take and that  have been prescribed to you. Take any new Medicines after you have completely understood and accept all the possible adverse reactions/side effects.   Please note  You were cared for by a hospitalist during your hospital stay. If you have any questions about your discharge medications or the care you received while you were in the hospital after you are discharged, you can call the unit and asked to speak with the hospitalist on call if the hospitalist that took care of you is not available. Once you are discharged, your  primary care physician will handle any further medical issues. Please note that NO REFILLS for any discharge medications will be authorized once you are discharged, as it is imperative that you return to your primary care physician (or establish a relationship with a primary care physician if you do not have one) for your aftercare needs so that they can reassess your need for medications and monitor your lab values.    Today   CHIEF COMPLAINT:   Chief Complaint  Patient presents with  . Atrial Fibrillation    HISTORY OF PRESENT ILLNESS:  Juan Mayo  is a 83 y.o. male with a known history of CAD, CHF, CABG, HLD, Prostate cancer, Stroke, former smoker- was here with right sided pleural effusion 2 weeks ago, noted to have transudative fluid after tap. Felt better.  Last 2-3 days again feeling SOB with minimal activities, Cough with sputum production. No fever or chest pain. Noted to have RLL pneumonia on xray in ER.  VITAL SIGNS:  Blood pressure 129/65, pulse 93, temperature (!) 97.4 F (36.3 C), temperature source Oral, resp. rate 18, height 5\' 8"  (1.727 m), weight 55.7 kg (122 lb 12.8 oz), SpO2 98 %.  I/O:  No intake or output data in the 24 hours ending 12/23/16 0906  PHYSICAL EXAMINATION:   GENERAL:  83 y.o.-year-old patient lying in the bed with no acute distress.  EYES: Pupils equal, round, reactive to light and accommodation. No scleral icterus. Extraocular muscles intact.  HEENT: Head atraumatic, normocephalic. Oropharynx and nasopharynx clear.  NECK:  Supple, no jugular venous distention. No thyroid enlargement, no tenderness.  LUNGS: Normal breath sounds bilaterally, no wheezing, some crepitation. No use of accessory muscles of respiration.  CARDIOVASCULAR: S1, S2 normal. No murmurs, rubs, or gallops.  ABDOMEN: Soft, nontender, nondistended. Bowel sounds present. No organomegaly or mass.  EXTREMITIES: No pedal edema, cyanosis, or clubbing.  NEUROLOGIC: Cranial nerves II  through XII are intact. Muscle strength 4- 5/5 in all extremities. Sensation intact. Gait not checked.  PSYCHIATRIC: The patient is alert and oriented x 3.  SKIN: No obvious rash, lesion, or ulcer.   DATA REVIEW:   CBC  Recent Labs Lab 12/19/16 0442  WBC 9.8  HGB 10.0*  HCT 29.5*  PLT 251    Chemistries   Recent Labs Lab 12/18/16 0418 12/19/16 0442  NA 138 136  K 4.0 4.7  CL 103 99*  CO2 29 31  GLUCOSE 93 97  BUN 14 11  CREATININE 0.78 0.75  CALCIUM 8.2* 7.9*  AST 20  --   ALT 13*  --   ALKPHOS 74  --   BILITOT 0.7  --     Cardiac Enzymes  Recent Labs Lab 12/18/16 0418  TROPONINI <0.03    Microbiology Results  Results for orders placed or performed during the hospital encounter of 12/02/16  Body fluid culture     Status: None   Collection Time: 12/03/16 11:00 AM  Result Value  Ref Range Status   Specimen Description PLEURAL  Final   Special Requests NONE  Final   Gram Stain   Final    FEW WBC PRESENT,BOTH PMN AND MONONUCLEAR NO ORGANISMS SEEN    Culture   Final    NO GROWTH 3 DAYS Performed at [facility], [address], [place], [place] [address]    Report Status 12/06/2016 FINAL  Final    RADIOLOGY:  No results found.  EKG:   Orders placed or performed during the hospital encounter of 12/18/16  . EKG 12-Lead  . EKG 12-Lead  . EKG 12-Lead  . EKG 12-Lead      Management plans discussed with the patient, family and they are in agreement.  CODE STATUS:  Code Status History    Date Active Date Inactive Code Status Order ID Comments User Context   12/18/2016  9:18 AM 12/20/2016  2:16 PM Full Code [id number]  Vaughan Basta, MD Inpatient   12/02/2016  1:08 PM 12/04/2016  2:52 PM Full Code [id number]  Hillary Bow, MD ED   02/23/2016  3:56 PM 02/25/2016  7:56 PM Full Code [id number]  Demetrios Loll, MD Inpatient   03/18/2015  5:58 PM 03/21/2015  5:03 PM Full Code [id number]  [name], MD Inpatient   03/18/2015  8:20 AM 03/18/2015   5:58 PM Full Code [id number]  Harrie Foreman, MD Inpatient   03/18/2015  5:55 AM 03/18/2015  8:20 AM Full Code [id number]  [name], MD ED    Advance Directive Documentation     Most Recent Value  Type of Advance Directive  Healthcare Power of Attorney  Pre-existing out of facility DNR order (yellow form or pink MOST form)  -  "MOST" Form in Place?  -      TOTAL TIME TAKING CARE OF THIS PATIENT: 35 minutes.    Vaughan Basta M.D on 12/23/2016 at 9:06 AM  Between 7am to 6pm - Pager - [phone]  After 6pm go to [email or web address] - password [email or web address] [facility]  Office  [phone]  CC: Primary care physician; Leonel Ramsay, MD   Note: This dictation was prepared with Dragon dictation along with smaller phrase technology. Any transcriptional errors that result from this process are unintentional.

## 2016-12-31 DIAGNOSIS — J90 Pleural effusion, not elsewhere classified: Secondary | ICD-10-CM | POA: Diagnosis not present

## 2016-12-31 DIAGNOSIS — I69391 Dysphagia following cerebral infarction: Secondary | ICD-10-CM | POA: Diagnosis not present

## 2016-12-31 DIAGNOSIS — I509 Heart failure, unspecified: Secondary | ICD-10-CM | POA: Diagnosis not present

## 2016-12-31 DIAGNOSIS — I429 Cardiomyopathy, unspecified: Secondary | ICD-10-CM | POA: Diagnosis not present

## 2016-12-31 DIAGNOSIS — J181 Lobar pneumonia, unspecified organism: Secondary | ICD-10-CM | POA: Diagnosis not present

## 2016-12-31 DIAGNOSIS — I25810 Atherosclerosis of coronary artery bypass graft(s) without angina pectoris: Secondary | ICD-10-CM | POA: Diagnosis not present

## 2017-01-02 DIAGNOSIS — R0689 Other abnormalities of breathing: Secondary | ICD-10-CM | POA: Diagnosis not present

## 2017-01-02 DIAGNOSIS — R0600 Dyspnea, unspecified: Secondary | ICD-10-CM | POA: Diagnosis not present

## 2017-01-05 DIAGNOSIS — I119 Hypertensive heart disease without heart failure: Secondary | ICD-10-CM | POA: Diagnosis not present

## 2017-01-05 DIAGNOSIS — I25810 Atherosclerosis of coronary artery bypass graft(s) without angina pectoris: Secondary | ICD-10-CM | POA: Diagnosis not present

## 2017-01-05 DIAGNOSIS — I350 Nonrheumatic aortic (valve) stenosis: Secondary | ICD-10-CM | POA: Diagnosis not present

## 2017-01-09 ENCOUNTER — Other Ambulatory Visit: Payer: Self-pay | Admitting: Infectious Diseases

## 2017-01-09 DIAGNOSIS — J189 Pneumonia, unspecified organism: Secondary | ICD-10-CM

## 2017-01-09 DIAGNOSIS — I69391 Dysphagia following cerebral infarction: Secondary | ICD-10-CM

## 2017-01-09 DIAGNOSIS — J181 Lobar pneumonia, unspecified organism: Principal | ICD-10-CM

## 2017-01-14 DIAGNOSIS — I25810 Atherosclerosis of coronary artery bypass graft(s) without angina pectoris: Secondary | ICD-10-CM | POA: Diagnosis not present

## 2017-01-14 DIAGNOSIS — J90 Pleural effusion, not elsewhere classified: Secondary | ICD-10-CM | POA: Diagnosis not present

## 2017-01-14 DIAGNOSIS — J181 Lobar pneumonia, unspecified organism: Secondary | ICD-10-CM | POA: Diagnosis not present

## 2017-04-13 DIAGNOSIS — N390 Urinary tract infection, site not specified: Secondary | ICD-10-CM | POA: Diagnosis not present

## 2017-04-24 DIAGNOSIS — Z961 Presence of intraocular lens: Secondary | ICD-10-CM | POA: Diagnosis not present

## 2017-04-24 DIAGNOSIS — H353131 Nonexudative age-related macular degeneration, bilateral, early dry stage: Secondary | ICD-10-CM | POA: Diagnosis not present

## 2017-04-24 DIAGNOSIS — H04123 Dry eye syndrome of bilateral lacrimal glands: Secondary | ICD-10-CM | POA: Diagnosis not present

## 2017-05-04 DIAGNOSIS — N390 Urinary tract infection, site not specified: Secondary | ICD-10-CM | POA: Diagnosis not present

## 2017-05-06 DIAGNOSIS — R001 Bradycardia, unspecified: Secondary | ICD-10-CM | POA: Diagnosis not present

## 2017-05-06 DIAGNOSIS — I25810 Atherosclerosis of coronary artery bypass graft(s) without angina pectoris: Secondary | ICD-10-CM | POA: Diagnosis not present

## 2017-05-06 DIAGNOSIS — I350 Nonrheumatic aortic (valve) stenosis: Secondary | ICD-10-CM | POA: Diagnosis not present

## 2017-05-06 DIAGNOSIS — I493 Ventricular premature depolarization: Secondary | ICD-10-CM | POA: Diagnosis not present

## 2017-05-08 DIAGNOSIS — C6961 Malignant neoplasm of right orbit: Secondary | ICD-10-CM | POA: Diagnosis not present

## 2017-05-08 DIAGNOSIS — L570 Actinic keratosis: Secondary | ICD-10-CM | POA: Diagnosis not present

## 2017-05-08 DIAGNOSIS — C44222 Squamous cell carcinoma of skin of right ear and external auricular canal: Secondary | ICD-10-CM | POA: Diagnosis not present

## 2017-05-08 DIAGNOSIS — Z08 Encounter for follow-up examination after completed treatment for malignant neoplasm: Secondary | ICD-10-CM | POA: Diagnosis not present

## 2017-05-08 DIAGNOSIS — L905 Scar conditions and fibrosis of skin: Secondary | ICD-10-CM | POA: Diagnosis not present

## 2017-05-08 DIAGNOSIS — D485 Neoplasm of uncertain behavior of skin: Secondary | ICD-10-CM | POA: Diagnosis not present

## 2017-05-08 DIAGNOSIS — X32XXXA Exposure to sunlight, initial encounter: Secondary | ICD-10-CM | POA: Diagnosis not present

## 2017-05-08 DIAGNOSIS — Z85828 Personal history of other malignant neoplasm of skin: Secondary | ICD-10-CM | POA: Diagnosis not present

## 2017-05-11 DIAGNOSIS — J90 Pleural effusion, not elsewhere classified: Secondary | ICD-10-CM | POA: Diagnosis not present

## 2017-05-11 DIAGNOSIS — I25810 Atherosclerosis of coronary artery bypass graft(s) without angina pectoris: Secondary | ICD-10-CM | POA: Diagnosis not present

## 2017-05-18 DIAGNOSIS — J90 Pleural effusion, not elsewhere classified: Secondary | ICD-10-CM | POA: Diagnosis not present

## 2017-05-18 DIAGNOSIS — I471 Supraventricular tachycardia: Secondary | ICD-10-CM | POA: Diagnosis not present

## 2017-05-18 DIAGNOSIS — I25810 Atherosclerosis of coronary artery bypass graft(s) without angina pectoris: Secondary | ICD-10-CM | POA: Diagnosis not present

## 2017-05-18 DIAGNOSIS — I350 Nonrheumatic aortic (valve) stenosis: Secondary | ICD-10-CM | POA: Diagnosis not present

## 2017-05-28 DIAGNOSIS — I63412 Cerebral infarction due to embolism of left middle cerebral artery: Secondary | ICD-10-CM | POA: Diagnosis not present

## 2017-05-28 DIAGNOSIS — J40 Bronchitis, not specified as acute or chronic: Secondary | ICD-10-CM | POA: Diagnosis not present

## 2017-05-28 DIAGNOSIS — R05 Cough: Secondary | ICD-10-CM | POA: Diagnosis not present

## 2017-06-01 DIAGNOSIS — J181 Lobar pneumonia, unspecified organism: Secondary | ICD-10-CM | POA: Diagnosis not present

## 2017-06-01 DIAGNOSIS — J90 Pleural effusion, not elsewhere classified: Secondary | ICD-10-CM | POA: Diagnosis not present

## 2017-06-01 DIAGNOSIS — I350 Nonrheumatic aortic (valve) stenosis: Secondary | ICD-10-CM | POA: Diagnosis not present

## 2017-06-01 DIAGNOSIS — Z0000 Encounter for general adult medical examination without abnormal findings: Secondary | ICD-10-CM | POA: Diagnosis not present

## 2017-06-01 DIAGNOSIS — I25810 Atherosclerosis of coronary artery bypass graft(s) without angina pectoris: Secondary | ICD-10-CM | POA: Diagnosis not present

## 2017-06-09 DIAGNOSIS — R05 Cough: Secondary | ICD-10-CM | POA: Diagnosis not present

## 2017-06-11 ENCOUNTER — Other Ambulatory Visit: Payer: Self-pay | Admitting: Family Medicine

## 2017-06-11 DIAGNOSIS — R05 Cough: Secondary | ICD-10-CM

## 2017-06-11 DIAGNOSIS — R059 Cough, unspecified: Secondary | ICD-10-CM

## 2017-06-25 ENCOUNTER — Ambulatory Visit
Admission: RE | Admit: 2017-06-25 | Discharge: 2017-06-25 | Disposition: A | Discharge status: Home / Self Care | Payer: PPO | Source: Ambulatory Visit | Attending: Family Medicine | Admitting: Family Medicine

## 2017-06-25 DIAGNOSIS — Z951 Presence of aortocoronary bypass graft: Secondary | ICD-10-CM | POA: Diagnosis not present

## 2017-06-25 DIAGNOSIS — I700 Atherosclerosis of aorta: Secondary | ICD-10-CM | POA: Diagnosis not present

## 2017-06-25 DIAGNOSIS — I2510 Atherosclerotic heart disease of native coronary artery without angina pectoris: Secondary | ICD-10-CM | POA: Diagnosis not present

## 2017-06-25 DIAGNOSIS — J984 Other disorders of lung: Secondary | ICD-10-CM | POA: Diagnosis not present

## 2017-06-25 DIAGNOSIS — J90 Pleural effusion, not elsewhere classified: Secondary | ICD-10-CM | POA: Insufficient documentation

## 2017-06-25 DIAGNOSIS — R918 Other nonspecific abnormal finding of lung field: Secondary | ICD-10-CM | POA: Insufficient documentation

## 2017-06-25 DIAGNOSIS — R059 Cough, unspecified: Secondary | ICD-10-CM

## 2017-06-25 DIAGNOSIS — J479 Bronchiectasis, uncomplicated: Secondary | ICD-10-CM | POA: Insufficient documentation

## 2017-06-25 DIAGNOSIS — R05 Cough: Secondary | ICD-10-CM | POA: Insufficient documentation

## 2017-06-25 DIAGNOSIS — J9811 Atelectasis: Secondary | ICD-10-CM | POA: Insufficient documentation

## 2017-06-26 DIAGNOSIS — I712 Thoracic aortic aneurysm, without rupture: Secondary | ICD-10-CM | POA: Diagnosis not present

## 2017-06-26 DIAGNOSIS — R911 Solitary pulmonary nodule: Secondary | ICD-10-CM | POA: Diagnosis not present

## 2017-06-26 DIAGNOSIS — J90 Pleural effusion, not elsewhere classified: Secondary | ICD-10-CM | POA: Diagnosis not present

## 2017-06-29 ENCOUNTER — Other Ambulatory Visit: Payer: Self-pay | Admitting: Family Medicine

## 2017-06-29 DIAGNOSIS — R911 Solitary pulmonary nodule: Secondary | ICD-10-CM

## 2017-06-29 DIAGNOSIS — J90 Pleural effusion, not elsewhere classified: Secondary | ICD-10-CM

## 2017-07-03 ENCOUNTER — Encounter
Admission: RE | Admit: 2017-07-03 | Discharge: 2017-07-03 | Disposition: A | Discharge status: Home / Self Care | Payer: PPO | Source: Ambulatory Visit | Attending: Family Medicine | Admitting: Family Medicine

## 2017-07-03 DIAGNOSIS — J90 Pleural effusion, not elsewhere classified: Secondary | ICD-10-CM | POA: Insufficient documentation

## 2017-07-03 DIAGNOSIS — R911 Solitary pulmonary nodule: Secondary | ICD-10-CM | POA: Insufficient documentation

## 2017-07-06 ENCOUNTER — Ambulatory Visit: Payer: PPO

## 2017-07-09 ENCOUNTER — Ambulatory Visit
Admission: RE | Admit: 2017-07-09 | Discharge: 2017-07-09 | Disposition: A | Discharge status: Home / Self Care | Payer: PPO | Source: Ambulatory Visit | Attending: Family Medicine | Admitting: Family Medicine

## 2017-07-09 DIAGNOSIS — J90 Pleural effusion, not elsewhere classified: Secondary | ICD-10-CM | POA: Insufficient documentation

## 2017-07-09 DIAGNOSIS — I700 Atherosclerosis of aorta: Secondary | ICD-10-CM | POA: Diagnosis not present

## 2017-07-09 DIAGNOSIS — R911 Solitary pulmonary nodule: Secondary | ICD-10-CM | POA: Insufficient documentation

## 2017-07-09 LAB — GLUCOSE, CAPILLARY: Glucose-Capillary: 73 mg/dL (ref 65–99)

## 2017-07-09 MED ORDER — FLUDEOXYGLUCOSE F - 18 (FDG) INJECTION
6.5300 | Freq: Once | INTRAVENOUS | Status: AC
  Administered 2017-07-09: 6.53 via INTRAVENOUS

## 2017-07-14 DIAGNOSIS — R918 Other nonspecific abnormal finding of lung field: Secondary | ICD-10-CM | POA: Diagnosis not present

## 2017-07-14 DIAGNOSIS — J181 Lobar pneumonia, unspecified organism: Secondary | ICD-10-CM | POA: Diagnosis not present

## 2017-07-14 DIAGNOSIS — J90 Pleural effusion, not elsewhere classified: Secondary | ICD-10-CM | POA: Diagnosis not present

## 2017-07-23 DIAGNOSIS — R911 Solitary pulmonary nodule: Secondary | ICD-10-CM | POA: Diagnosis not present

## 2017-07-23 DIAGNOSIS — I63412 Cerebral infarction due to embolism of left middle cerebral artery: Secondary | ICD-10-CM | POA: Diagnosis not present

## 2017-07-23 DIAGNOSIS — J90 Pleural effusion, not elsewhere classified: Secondary | ICD-10-CM | POA: Diagnosis not present

## 2017-08-04 DIAGNOSIS — I5030 Unspecified diastolic (congestive) heart failure: Secondary | ICD-10-CM | POA: Diagnosis not present

## 2017-08-04 DIAGNOSIS — R911 Solitary pulmonary nodule: Secondary | ICD-10-CM | POA: Diagnosis not present

## 2017-08-10 DIAGNOSIS — E785 Hyperlipidemia, unspecified: Secondary | ICD-10-CM | POA: Diagnosis not present

## 2017-08-10 DIAGNOSIS — Z955 Presence of coronary angioplasty implant and graft: Secondary | ICD-10-CM | POA: Diagnosis not present

## 2017-08-10 DIAGNOSIS — I110 Hypertensive heart disease with heart failure: Secondary | ICD-10-CM | POA: Diagnosis not present

## 2017-08-10 DIAGNOSIS — Z951 Presence of aortocoronary bypass graft: Secondary | ICD-10-CM | POA: Diagnosis not present

## 2017-08-10 DIAGNOSIS — Z7982 Long term (current) use of aspirin: Secondary | ICD-10-CM | POA: Diagnosis not present

## 2017-08-10 DIAGNOSIS — I2510 Atherosclerotic heart disease of native coronary artery without angina pectoris: Secondary | ICD-10-CM | POA: Diagnosis not present

## 2017-08-10 DIAGNOSIS — I509 Heart failure, unspecified: Secondary | ICD-10-CM | POA: Diagnosis not present

## 2017-08-10 DIAGNOSIS — J90 Pleural effusion, not elsewhere classified: Secondary | ICD-10-CM | POA: Diagnosis not present

## 2017-08-13 DIAGNOSIS — I2510 Atherosclerotic heart disease of native coronary artery without angina pectoris: Secondary | ICD-10-CM | POA: Diagnosis not present

## 2017-08-13 DIAGNOSIS — I5030 Unspecified diastolic (congestive) heart failure: Secondary | ICD-10-CM | POA: Diagnosis not present

## 2017-08-13 DIAGNOSIS — I10 Essential (primary) hypertension: Secondary | ICD-10-CM | POA: Diagnosis not present

## 2017-08-13 DIAGNOSIS — R911 Solitary pulmonary nodule: Secondary | ICD-10-CM | POA: Diagnosis not present

## 2017-09-03 DIAGNOSIS — I63412 Cerebral infarction due to embolism of left middle cerebral artery: Secondary | ICD-10-CM | POA: Diagnosis not present

## 2017-09-03 DIAGNOSIS — R001 Bradycardia, unspecified: Secondary | ICD-10-CM | POA: Diagnosis not present

## 2017-09-03 DIAGNOSIS — I25810 Atherosclerosis of coronary artery bypass graft(s) without angina pectoris: Secondary | ICD-10-CM | POA: Diagnosis not present

## 2017-09-03 DIAGNOSIS — I493 Ventricular premature depolarization: Secondary | ICD-10-CM | POA: Diagnosis not present

## 2017-09-03 DIAGNOSIS — I350 Nonrheumatic aortic (valve) stenosis: Secondary | ICD-10-CM | POA: Diagnosis not present

## 2017-09-03 DIAGNOSIS — I340 Nonrheumatic mitral (valve) insufficiency: Secondary | ICD-10-CM | POA: Diagnosis not present

## 2017-09-03 DIAGNOSIS — I071 Rheumatic tricuspid insufficiency: Secondary | ICD-10-CM | POA: Diagnosis not present

## 2017-09-03 DIAGNOSIS — I712 Thoracic aortic aneurysm, without rupture: Secondary | ICD-10-CM | POA: Diagnosis not present

## 2017-09-03 DIAGNOSIS — I471 Supraventricular tachycardia: Secondary | ICD-10-CM | POA: Diagnosis not present

## 2017-09-15 DIAGNOSIS — R0609 Other forms of dyspnea: Secondary | ICD-10-CM | POA: Diagnosis not present

## 2017-09-15 DIAGNOSIS — I25810 Atherosclerosis of coronary artery bypass graft(s) without angina pectoris: Secondary | ICD-10-CM | POA: Diagnosis not present

## 2017-09-15 DIAGNOSIS — R918 Other nonspecific abnormal finding of lung field: Secondary | ICD-10-CM | POA: Diagnosis not present

## 2017-10-05 DIAGNOSIS — J90 Pleural effusion, not elsewhere classified: Secondary | ICD-10-CM | POA: Diagnosis not present

## 2017-10-05 DIAGNOSIS — K219 Gastro-esophageal reflux disease without esophagitis: Secondary | ICD-10-CM | POA: Diagnosis not present

## 2017-10-13 DIAGNOSIS — J90 Pleural effusion, not elsewhere classified: Secondary | ICD-10-CM | POA: Diagnosis not present

## 2017-10-13 DIAGNOSIS — R911 Solitary pulmonary nodule: Secondary | ICD-10-CM | POA: Diagnosis not present

## 2017-10-13 DIAGNOSIS — R918 Other nonspecific abnormal finding of lung field: Secondary | ICD-10-CM | POA: Diagnosis not present

## 2017-10-25 ENCOUNTER — Emergency Department
Admission: EM | Admit: 2017-10-25 | Discharge: 2017-10-25 | Disposition: A | Discharge status: Home / Self Care | Payer: PPO | Attending: Emergency Medicine | Admitting: Emergency Medicine

## 2017-10-25 ENCOUNTER — Other Ambulatory Visit: Payer: Self-pay

## 2017-10-25 DIAGNOSIS — Z79899 Other long term (current) drug therapy: Secondary | ICD-10-CM | POA: Diagnosis not present

## 2017-10-25 DIAGNOSIS — I509 Heart failure, unspecified: Secondary | ICD-10-CM | POA: Diagnosis not present

## 2017-10-25 DIAGNOSIS — T447X1A Poisoning by beta-adrenoreceptor antagonists, accidental (unintentional), initial encounter: Secondary | ICD-10-CM | POA: Insufficient documentation

## 2017-10-25 DIAGNOSIS — T50901A Poisoning by unspecified drugs, medicaments and biological substances, accidental (unintentional), initial encounter: Secondary | ICD-10-CM

## 2017-10-25 DIAGNOSIS — I2510 Atherosclerotic heart disease of native coronary artery without angina pectoris: Secondary | ICD-10-CM | POA: Diagnosis not present

## 2017-10-25 DIAGNOSIS — Z7902 Long term (current) use of antithrombotics/antiplatelets: Secondary | ICD-10-CM | POA: Diagnosis not present

## 2017-10-25 DIAGNOSIS — Z87891 Personal history of nicotine dependence: Secondary | ICD-10-CM | POA: Insufficient documentation

## 2017-10-25 DIAGNOSIS — T360X1A Poisoning by penicillins, accidental (unintentional), initial encounter: Secondary | ICD-10-CM | POA: Insufficient documentation

## 2017-10-25 DIAGNOSIS — Z8546 Personal history of malignant neoplasm of prostate: Secondary | ICD-10-CM | POA: Diagnosis not present

## 2017-10-25 DIAGNOSIS — T424X1A Poisoning by benzodiazepines, accidental (unintentional), initial encounter: Secondary | ICD-10-CM | POA: Insufficient documentation

## 2017-10-25 DIAGNOSIS — T50904A Poisoning by unspecified drugs, medicaments and biological substances, undetermined, initial encounter: Secondary | ICD-10-CM | POA: Diagnosis not present

## 2017-10-25 LAB — DIFFERENTIAL
BASOS ABS: 0.1 10*3/uL (ref 0–0.1)
Basophils Relative: 2 %
EOS ABS: 0.2 10*3/uL (ref 0–0.7)
Eosinophils Relative: 5 %
Lymphocytes Relative: 13 %
Lymphs Abs: 0.7 10*3/uL — ABNORMAL LOW (ref 1.0–3.6)
MONOS PCT: 8 %
Monocytes Absolute: 0.4 10*3/uL (ref 0.2–1.0)
NEUTROS ABS: 3.6 10*3/uL (ref 1.4–6.5)
Neutrophils Relative %: 72 %

## 2017-10-25 LAB — CBC
HCT: 33.5 % — ABNORMAL LOW (ref 40.0–52.0)
Hemoglobin: 11.5 g/dL — ABNORMAL LOW (ref 13.0–18.0)
MCH: 31.7 pg (ref 26.0–34.0)
MCHC: 34.3 g/dL (ref 32.0–36.0)
MCV: 92.4 fL (ref 80.0–100.0)
Platelets: 287 10*3/uL (ref 150–440)
RBC: 3.62 MIL/uL — ABNORMAL LOW (ref 4.40–5.90)
RDW: 13 % (ref 11.5–14.5)
WBC: 5 10*3/uL (ref 3.8–10.6)

## 2017-10-25 LAB — COMPREHENSIVE METABOLIC PANEL
ALT: 18 U/L (ref 0–44)
ANION GAP: 4 — AB (ref 5–15)
AST: 21 U/L (ref 15–41)
Albumin: 2.9 g/dL — ABNORMAL LOW (ref 3.5–5.0)
Alkaline Phosphatase: 78 U/L (ref 38–126)
BUN: 14 mg/dL (ref 8–23)
CALCIUM: 8.2 mg/dL — AB (ref 8.9–10.3)
CO2: 32 mmol/L (ref 22–32)
CREATININE: 0.75 mg/dL (ref 0.61–1.24)
Chloride: 97 mmol/L — ABNORMAL LOW (ref 98–111)
Glucose, Bld: 82 mg/dL (ref 70–99)
POTASSIUM: 4.3 mmol/L (ref 3.5–5.1)
Sodium: 133 mmol/L — ABNORMAL LOW (ref 135–145)
TOTAL PROTEIN: 6.2 g/dL — AB (ref 6.5–8.1)
Total Bilirubin: 0.7 mg/dL (ref 0.3–1.2)

## 2017-10-25 LAB — SALICYLATE LEVEL

## 2017-10-25 LAB — TROPONIN I

## 2017-10-25 LAB — ACETAMINOPHEN LEVEL

## 2017-10-25 NOTE — ED Triage Notes (Addendum)
Pt arrives from home via [facility] where he has a caregiver. 130/64, HR 68-80 a fib, 98% RA.   EMS reports that pt was started on clindamycin recently for a lung spot. Caregiver found the capsules opened in the trash but no powder from inside the capsules, 21 capsules found in trash. 7 metoprolol missing. 3 lorazapam missing. EMS reports possibly took all these around 5pm last night. They were in a pill counter and pt states he got confused about caregivers instructions on how to take them.   Alert, oriented, speaking in complete sentences. Denies pain, denies weakness. Denies feeling sleepy.

## 2017-10-25 NOTE — ED Notes (Signed)
Family at bedside. 

## 2017-10-25 NOTE — ED Provider Notes (Signed)
[facility] Emergency Department Provider Note   ____________________________________________   First MD Initiated Contact with Patient 10/25/17 [phone]     (approximate)  I have reviewed the triage vital signs and the nursing notes.   HISTORY  Chief Complaint Drug Overdose    HPI Juan Mayo is a 84 y.o. male brought for evaluation for concerns for possible drug overdose  Patient reports that he was recently given a new prescription for antibiotic, and was in his pill container and instructions he received from his caretaker who identifies as his niece he reports got confused and instead of taking 1 day worth of pills he took the remainder of about a week's worth of medication from his pill minder.  He reports he took these yesterday between 6:55 PM.  He felt no different after taking them, and they realized this morning that he is taking all the medicines prompting to come here.  Reports no concerns.  Denies feeling weak tired or dizzy.  No headache.  No cramps or muscle aches.  No fevers or chills.  Reports he feels well at this time, no personal concerns other than reporting that his medications got confused.  He reports he is taking the clindamycin because they found to potential lung nodule or possible cancer in his lung, and there was a little area that they thought could be infection and they wanted to try treating it.  No fevers or chills.  No chest pain or trouble breathing.  No headache.  No palpitations.  No weakness lightheadedness or other symptoms.  There is no intent to harm himself.  This is unintentional, there is a confusion that occurred regarding his medications.  Though he takes several medications, medications that he took yesterday are estimated to be about 21 clindamycin tablets, approximately 7 metoprolol tablets, and 3-4 Xanax tablets.  They were all ingested yesterday between 6:55 PM.  Did not take any medications this  morning.   Past Medical History:  Diagnosis Date  . CAD (coronary artery disease)   . CHF (congestive heart failure) ([place])   . Hx of CABG   . Hypercholesteremia   . Neuropathy   . Prostate cancer ([place])   . Stroke ([place])   . SVT (supraventricular tachycardia) [facility])     Patient Active Problem List   Diagnosis Date Noted  . Community acquired pneumonia 12/18/2016  . Pneumonia 12/18/2016  . Protein-calorie malnutrition, severe 12/04/2016  . Pleural effusion 12/02/2016  . CVA (cerebral vascular accident) ([place]) 02/23/2016  . Hip fracture ([place]) 03/18/2015    Past Surgical History:  Procedure Laterality Date  . AORTIC VALVE REPLACEMENT    . BACK SURGERY    . CORONARY ARTERY BYPASS GRAFT    . FRACTURE SURGERY Right   . INTRAMEDULLARY (IM) NAIL INTERTROCHANTERIC Right 03/18/2015   Procedure: INTRAMEDULLARY (IM) NAIL INTERTROCHANTRIC;  Surgeon: [name], MD;  Location: [facility] ORS;  Service: Orthopedics;  Laterality: Right;  . PROSTATECTOMY    . REPLACEMENT TOTAL KNEE BILATERAL      Prior to Admission medications   Medication Sig Start Date End Date Taking? Authorizing Provider  atorvastatin (LIPITOR) 40 MG tablet Take 1 tablet (40 mg total) by mouth daily at 6 PM. Patient taking differently: Take 40 mg by mouth at bedtime.  02/25/16   Epifanio Lesches, MD  clopidogrel (PLAVIX) 75 MG tablet Take 1 tablet (75 mg total) by mouth daily. 02/26/16   Epifanio Lesches, MD  furosemide (LASIX) 20 MG tablet Take 1  tablet (20 mg total) by mouth daily. 12/04/16   Henreitta Leber, MD  gabapentin (NEURONTIN) 600 MG tablet Take 300 mg by mouth 2 (two) times daily.     [provider]  metoprolol tartrate (LOPRESSOR) 25 MG tablet Take 25 mg by mouth 2 (two) times daily.    [provider]  Multiple Vitamins-Minerals (CENTRUM SILVER) tablet Take 1 tablet by mouth daily.    [provider]  pantoprazole (PROTONIX) 20 MG tablet Take 20 mg by mouth daily.    [provider]  potassium chloride (K-DUR) 10 MEQ tablet Take 1 tablet (10 mEq total) by mouth daily. 12/04/16   Henreitta Leber, MD    Allergies Patient has no known allergies.  Family History  Problem Relation Age of Onset  . Hypertension Other   . Parkinson's disease Mother   . Alzheimer's disease Father   . Cancer Sister   . Heart disease Brother   . Heart disease Brother   . Heart disease Brother     Social History Social History   Tobacco Use  . Smoking status: Former Smoker    Types: Cigarettes  . Smokeless tobacco: Never Used  Substance Use Topics  . Alcohol use: No  . Drug use: No    Review of Systems Constitutional: No fever/chills Eyes: No visual changes. ENT: No sore throat. Cardiovascular: Denies chest pain. Respiratory: Denies shortness of breath. Gastrointestinal: No abdominal pain.  No nausea, no vomiting.   Genitourinary: No dark or trouble urinating. Musculoskeletal: Negative for back pain. Skin: Negative for rash. Neurological: Negative for headaches, focal weakness or numbness.    ____________________________________________   PHYSICAL EXAM:  VITAL SIGNS: ED Triage Vitals  Enc Vitals Group     BP 10/25/17 0834 133/70     Pulse Rate 10/25/17 0834 (!) 101     Resp 10/25/17 0834 17     Temp 10/25/17 0834 97.6 F (36.4 C)     Temp Source 10/25/17 0834 Oral     SpO2 10/25/17 0834 98 %     Weight 10/25/17 0832 125 lb (56.7 kg)     Height 10/25/17 0832 5\' 8"  (1.727 m)     Head Circumference --      Peak Flow --      Pain Score 10/25/17 0831 0     Pain Loc --      Pain Edu? --      Excl. in [place]? --    Constitutional: Alert and oriented. Well appearing and in no acute distress. Eyes: Conjunctivae are normal. Head: Atraumatic. Nose: No congestion/rhinnorhea. Mouth/Throat: Mucous membranes are moist. Neck: No stridor.   Cardiovascular: Normal rate, regular rhythm. Grossly normal heart sounds.  Good peripheral circulation. Respiratory:  Normal respiratory effort.  No retractions. Lungs CTAB. Gastrointestinal: Soft and nontender. No distention. Musculoskeletal: No lower extremity tenderness nor edema. Neurologic:  Normal speech and language. No gross focal neurologic deficits are appreciated.  Skin:  Skin is warm, dry and intact. No rash noted. Psychiatric: Mood and affect are normal. Speech and behavior are normal. ____________________________________________   LABS (all labs ordered are listed, but only abnormal results are displayed)  Labs Reviewed  COMPREHENSIVE METABOLIC PANEL - Abnormal; Notable for the following components:      Result Value   Sodium 133 (*)    Chloride 97 (*)    Calcium 8.2 (*)    Total Protein 6.2 (*)    Albumin 2.9 (*)    Anion gap 4 (*)  All other components within normal limits  CBC - Abnormal; Notable for the following components:   RBC 3.62 (*)    Hemoglobin 11.5 (*)    HCT 33.5 (*)    All other components within normal limits  DIFFERENTIAL - Abnormal; Notable for the following components:   Lymphs Abs 0.7 (*)    All other components within normal limits  TROPONIN I  SALICYLATE LEVEL  ACETAMINOPHEN LEVEL  CBC WITH DIFFERENTIAL/PLATELET   ____________________________________________  EKG  Reviewed enterotomy at 8:30 AM Heart rate 70 QRS 90 QTc 450 Normal sinus rhythm, occasional PAC.  Suspect some slight repolarization abnormality in V2 and V3, no evidence of acute ischemia.  Of note patient is not complaining of anything at the present time.  No chest pain. ____________________________________________  RADIOLOGY   ____________________________________________   PROCEDURES  Procedure(s) performed: None  Procedures  Critical Care performed: No  ____________________________________________   INITIAL IMPRESSION / ASSESSMENT AND PLAN / ED COURSE  Pertinent labs & imaging results that were available during my care of the patient were reviewed by me and  considered in my medical decision making (see chart for details).  Patient presents for an unintended overdose of his medications.  Appears there was some confusion last night, family now at the bedside.  Patient asymptomatic.  Reports no ongoing symptoms.  [place] poison control was contacted, they recommend a 12-hour observation window from the time of ingestion which was at latest about 7 PM last night, patient now outside that window with normal hemodynamics and reassuring blood work.  As he is asymptomatic, green 12 hours from ingestion, will discharge to home with his niece who assists in his care.  She reports they will monitor his medications much closer now, and the patient acknowledges that he will not take more than he should in the future when especially if he has any concerns about what medication he is taking.  They will follow-up with her primary care doctor, they will call [facility] where he is seen tomorrow morning for further follow-up and medication advice/changes if recommended by primary.  Return precautions and treatment recommendations and follow-up discussed with the patient who is agreeable with the plan.       ____________________________________________   FINAL CLINICAL IMPRESSION(S) / ED DIAGNOSES  Final diagnoses:  Medication overdose, accidental or unintentional, initial encounter      NEW MEDICATIONS STARTED DURING THIS VISIT:  New Prescriptions   No medications on file     Note:  This document was prepared using Dragon voice recognition software and may include unintentional dictation errors.     Delman Kitten, MD 10/25/17 [phone]

## 2017-10-25 NOTE — ED Notes (Addendum)
This RN called poison control for treatment recommendations.   Clindamycin: GI symptoms Metoprolol: bradycardia, hypotension Lorazapam: CNS depression  Observation time would have been 12 hours, but since it's been over 12 hours since pt took medications no specific observation period at this time.  Since asymptomatic at this time, no therapies at this time.   Poison control will contact RN back to obtain lab results later.

## 2017-10-25 NOTE — ED Notes (Signed)
Family member at bedside.

## 2017-10-26 DIAGNOSIS — D649 Anemia, unspecified: Secondary | ICD-10-CM | POA: Diagnosis not present

## 2017-10-26 DIAGNOSIS — J690 Pneumonitis due to inhalation of food and vomit: Secondary | ICD-10-CM | POA: Diagnosis not present

## 2017-10-26 DIAGNOSIS — R911 Solitary pulmonary nodule: Secondary | ICD-10-CM | POA: Diagnosis not present

## 2017-11-16 DIAGNOSIS — R918 Other nonspecific abnormal finding of lung field: Secondary | ICD-10-CM | POA: Diagnosis not present

## 2017-11-16 DIAGNOSIS — J449 Chronic obstructive pulmonary disease, unspecified: Secondary | ICD-10-CM | POA: Diagnosis not present

## 2017-11-16 DIAGNOSIS — R634 Abnormal weight loss: Secondary | ICD-10-CM | POA: Diagnosis not present

## 2017-11-16 DIAGNOSIS — R05 Cough: Secondary | ICD-10-CM | POA: Diagnosis not present

## 2017-11-16 DIAGNOSIS — R8290 Unspecified abnormal findings in urine: Secondary | ICD-10-CM | POA: Diagnosis not present

## 2017-11-16 DIAGNOSIS — L89101 Pressure ulcer of unspecified part of back, stage 1: Secondary | ICD-10-CM | POA: Diagnosis not present

## 2017-11-16 DIAGNOSIS — M546 Pain in thoracic spine: Secondary | ICD-10-CM | POA: Diagnosis not present

## 2017-11-16 DIAGNOSIS — J181 Lobar pneumonia, unspecified organism: Secondary | ICD-10-CM | POA: Diagnosis not present

## 2017-11-16 DIAGNOSIS — R911 Solitary pulmonary nodule: Secondary | ICD-10-CM | POA: Diagnosis not present

## 2017-11-18 DIAGNOSIS — L89891 Pressure ulcer of other site, stage 1: Secondary | ICD-10-CM | POA: Diagnosis not present

## 2017-11-18 DIAGNOSIS — R634 Abnormal weight loss: Secondary | ICD-10-CM | POA: Diagnosis not present

## 2017-11-18 DIAGNOSIS — Z8673 Personal history of transient ischemic attack (TIA), and cerebral infarction without residual deficits: Secondary | ICD-10-CM | POA: Diagnosis not present

## 2017-11-18 DIAGNOSIS — I110 Hypertensive heart disease with heart failure: Secondary | ICD-10-CM | POA: Diagnosis not present

## 2017-11-18 DIAGNOSIS — I509 Heart failure, unspecified: Secondary | ICD-10-CM | POA: Diagnosis not present

## 2017-11-18 DIAGNOSIS — C61 Malignant neoplasm of prostate: Secondary | ICD-10-CM | POA: Diagnosis not present

## 2017-11-18 DIAGNOSIS — N400 Enlarged prostate without lower urinary tract symptoms: Secondary | ICD-10-CM | POA: Diagnosis not present

## 2017-11-18 DIAGNOSIS — I700 Atherosclerosis of aorta: Secondary | ICD-10-CM | POA: Diagnosis not present

## 2017-11-18 DIAGNOSIS — M546 Pain in thoracic spine: Secondary | ICD-10-CM | POA: Diagnosis not present

## 2017-11-18 DIAGNOSIS — E7800 Pure hypercholesterolemia, unspecified: Secondary | ICD-10-CM | POA: Diagnosis not present

## 2017-11-18 DIAGNOSIS — I2510 Atherosclerotic heart disease of native coronary artery without angina pectoris: Secondary | ICD-10-CM | POA: Diagnosis not present

## 2017-11-18 DIAGNOSIS — G629 Polyneuropathy, unspecified: Secondary | ICD-10-CM | POA: Diagnosis not present

## 2017-11-18 DIAGNOSIS — Z9181 History of falling: Secondary | ICD-10-CM | POA: Diagnosis not present

## 2017-11-18 DIAGNOSIS — Z87891 Personal history of nicotine dependence: Secondary | ICD-10-CM | POA: Diagnosis not present

## 2017-11-18 DIAGNOSIS — M5030 Other cervical disc degeneration, unspecified cervical region: Secondary | ICD-10-CM | POA: Diagnosis not present

## 2017-11-20 DIAGNOSIS — R5381 Other malaise: Secondary | ICD-10-CM | POA: Diagnosis not present

## 2017-11-20 DIAGNOSIS — M546 Pain in thoracic spine: Secondary | ICD-10-CM | POA: Diagnosis not present

## 2017-11-20 DIAGNOSIS — R531 Weakness: Secondary | ICD-10-CM | POA: Diagnosis not present

## 2017-11-20 DIAGNOSIS — J181 Lobar pneumonia, unspecified organism: Secondary | ICD-10-CM | POA: Diagnosis not present

## 2017-11-20 DIAGNOSIS — J90 Pleural effusion, not elsewhere classified: Secondary | ICD-10-CM | POA: Diagnosis not present

## 2017-11-20 DIAGNOSIS — R5383 Other fatigue: Secondary | ICD-10-CM | POA: Diagnosis not present

## 2017-12-01 DIAGNOSIS — R410 Disorientation, unspecified: Secondary | ICD-10-CM | POA: Diagnosis not present

## 2017-12-01 DIAGNOSIS — R634 Abnormal weight loss: Secondary | ICD-10-CM | POA: Diagnosis not present

## 2017-12-01 DIAGNOSIS — R911 Solitary pulmonary nodule: Secondary | ICD-10-CM | POA: Diagnosis not present

## 2017-12-01 DIAGNOSIS — J90 Pleural effusion, not elsewhere classified: Secondary | ICD-10-CM | POA: Diagnosis not present

## 2017-12-08 DIAGNOSIS — R918 Other nonspecific abnormal finding of lung field: Secondary | ICD-10-CM | POA: Diagnosis not present

## 2017-12-08 DIAGNOSIS — T17908S Unspecified foreign body in respiratory tract, part unspecified causing other injury, sequela: Secondary | ICD-10-CM | POA: Diagnosis not present

## 2017-12-08 DIAGNOSIS — I2510 Atherosclerotic heart disease of native coronary artery without angina pectoris: Secondary | ICD-10-CM | POA: Diagnosis not present

## 2017-12-08 DIAGNOSIS — I700 Atherosclerosis of aorta: Secondary | ICD-10-CM | POA: Diagnosis not present

## 2017-12-08 DIAGNOSIS — R634 Abnormal weight loss: Secondary | ICD-10-CM | POA: Diagnosis not present

## 2017-12-08 DIAGNOSIS — M5030 Other cervical disc degeneration, unspecified cervical region: Secondary | ICD-10-CM | POA: Diagnosis not present

## 2017-12-08 DIAGNOSIS — L89891 Pressure ulcer of other site, stage 1: Secondary | ICD-10-CM | POA: Diagnosis not present

## 2017-12-08 DIAGNOSIS — I110 Hypertensive heart disease with heart failure: Secondary | ICD-10-CM | POA: Diagnosis not present

## 2017-12-08 DIAGNOSIS — N400 Enlarged prostate without lower urinary tract symptoms: Secondary | ICD-10-CM | POA: Diagnosis not present

## 2017-12-08 DIAGNOSIS — M546 Pain in thoracic spine: Secondary | ICD-10-CM | POA: Diagnosis not present

## 2017-12-08 DIAGNOSIS — I509 Heart failure, unspecified: Secondary | ICD-10-CM | POA: Diagnosis not present

## 2017-12-08 DIAGNOSIS — C61 Malignant neoplasm of prostate: Secondary | ICD-10-CM | POA: Diagnosis not present

## 2017-12-10 DIAGNOSIS — I340 Nonrheumatic mitral (valve) insufficiency: Secondary | ICD-10-CM | POA: Diagnosis not present

## 2017-12-10 DIAGNOSIS — I25810 Atherosclerosis of coronary artery bypass graft(s) without angina pectoris: Secondary | ICD-10-CM | POA: Diagnosis not present

## 2017-12-10 DIAGNOSIS — I471 Supraventricular tachycardia: Secondary | ICD-10-CM | POA: Diagnosis not present

## 2017-12-10 DIAGNOSIS — I482 Chronic atrial fibrillation: Secondary | ICD-10-CM | POA: Diagnosis not present

## 2017-12-10 DIAGNOSIS — R001 Bradycardia, unspecified: Secondary | ICD-10-CM | POA: Diagnosis not present

## 2017-12-10 DIAGNOSIS — I350 Nonrheumatic aortic (valve) stenosis: Secondary | ICD-10-CM | POA: Diagnosis not present

## 2017-12-22 DIAGNOSIS — R634 Abnormal weight loss: Secondary | ICD-10-CM | POA: Diagnosis not present

## 2017-12-22 DIAGNOSIS — N400 Enlarged prostate without lower urinary tract symptoms: Secondary | ICD-10-CM | POA: Diagnosis not present

## 2017-12-22 DIAGNOSIS — R001 Bradycardia, unspecified: Secondary | ICD-10-CM | POA: Diagnosis not present

## 2017-12-22 DIAGNOSIS — I10 Essential (primary) hypertension: Secondary | ICD-10-CM | POA: Diagnosis not present

## 2017-12-22 DIAGNOSIS — R627 Adult failure to thrive: Secondary | ICD-10-CM | POA: Diagnosis not present

## 2017-12-22 DIAGNOSIS — R918 Other nonspecific abnormal finding of lung field: Secondary | ICD-10-CM | POA: Diagnosis not present

## 2017-12-22 DIAGNOSIS — I2510 Atherosclerotic heart disease of native coronary artery without angina pectoris: Secondary | ICD-10-CM | POA: Diagnosis not present

## 2017-12-22 DIAGNOSIS — J918 Pleural effusion in other conditions classified elsewhere: Secondary | ICD-10-CM | POA: Diagnosis not present

## 2017-12-22 DIAGNOSIS — I471 Supraventricular tachycardia: Secondary | ICD-10-CM | POA: Diagnosis not present

## 2017-12-22 DIAGNOSIS — I083 Combined rheumatic disorders of mitral, aortic and tricuspid valves: Secondary | ICD-10-CM | POA: Diagnosis not present

## 2017-12-22 DIAGNOSIS — I714 Abdominal aortic aneurysm, without rupture: Secondary | ICD-10-CM | POA: Diagnosis not present

## 2017-12-29 DEATH — deceased
